# Patient Record
Sex: Male | Born: 1990 | Race: Black or African American | Hispanic: No | Marital: Single | Smoking: Current some day smoker
Health system: Southern US, Community
[De-identification: ages and names within clinical notes are randomized; demographics above are authoritative.]

## PROBLEM LIST (undated history)

## (undated) ENCOUNTER — Emergency Department (HOSPITAL_COMMUNITY): Admission: EM | Discharge status: Absent without leave | Payer: Medicaid Other

## (undated) ENCOUNTER — Emergency Department (HOSPITAL_COMMUNITY): Discharge status: Against Medical Advice | Payer: Self-pay

## (undated) DIAGNOSIS — E559 Vitamin D deficiency, unspecified: Secondary | ICD-10-CM

## (undated) DIAGNOSIS — K5000 Crohn's disease of small intestine without complications: Secondary | ICD-10-CM

## (undated) DIAGNOSIS — F71 Moderate intellectual disabilities: Secondary | ICD-10-CM

## (undated) DIAGNOSIS — Z7952 Long term (current) use of systemic steroids: Secondary | ICD-10-CM

## (undated) DIAGNOSIS — F909 Attention-deficit hyperactivity disorder, unspecified type: Secondary | ICD-10-CM

## (undated) DIAGNOSIS — K589 Irritable bowel syndrome without diarrhea: Secondary | ICD-10-CM

## (undated) HISTORY — PX: WRIST FRACTURE SURGERY: SHX121

## (undated) HISTORY — PX: COLONOSCOPY: SHX174

## (undated) HISTORY — DX: Long term (current) use of systemic steroids: Z79.52

## (undated) HISTORY — DX: Moderate intellectual disabilities: F71

---
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
## 2013-11-11 ENCOUNTER — Encounter (HOSPITAL_COMMUNITY): Payer: Self-pay | Admitting: Emergency Medicine

## 2013-11-11 ENCOUNTER — Emergency Department (HOSPITAL_COMMUNITY)
Admission: EM | Admit: 2013-11-11 | Discharge: 2013-11-11 | Disposition: A | Discharge status: Home / Self Care | Payer: Self-pay | Attending: Emergency Medicine | Admitting: Emergency Medicine

## 2013-11-11 DIAGNOSIS — R109 Unspecified abdominal pain: Secondary | ICD-10-CM

## 2013-11-11 DIAGNOSIS — R112 Nausea with vomiting, unspecified: Secondary | ICD-10-CM | POA: Insufficient documentation

## 2013-11-11 DIAGNOSIS — R1084 Generalized abdominal pain: Secondary | ICD-10-CM | POA: Insufficient documentation

## 2013-11-11 DIAGNOSIS — R1110 Vomiting, unspecified: Secondary | ICD-10-CM

## 2013-11-11 DIAGNOSIS — F17200 Nicotine dependence, unspecified, uncomplicated: Secondary | ICD-10-CM | POA: Insufficient documentation

## 2013-11-11 DIAGNOSIS — Z8659 Personal history of other mental and behavioral disorders: Secondary | ICD-10-CM | POA: Insufficient documentation

## 2013-11-11 LAB — COMPREHENSIVE METABOLIC PANEL
ALBUMIN: 3.8 g/dL (ref 3.5–5.2)
ALT: 8 U/L (ref 0–53)
AST: 16 U/L (ref 0–37)
Alkaline Phosphatase: 101 U/L (ref 39–117)
BUN: 11 mg/dL (ref 6–23)
CALCIUM: 9.4 mg/dL (ref 8.4–10.5)
CO2: 27 mEq/L (ref 19–32)
CREATININE: 0.94 mg/dL (ref 0.50–1.35)
Chloride: 99 mEq/L (ref 96–112)
GFR calc Af Amer: >90 mL/min (ref >90)
GFR calc non Af Amer: >90 mL/min (ref >90)
Glucose, Bld: 107 mg/dL — ABNORMAL HIGH (ref 70–99)
Potassium: 4.3 mEq/L (ref 3.7–5.3)
Sodium: 139 mEq/L (ref 137–147)
TOTAL PROTEIN: 7.6 g/dL (ref 6.0–8.3)
Total Bilirubin: 0.4 mg/dL (ref 0.3–1.2)

## 2013-11-11 LAB — CBC WITH DIFFERENTIAL/PLATELET
BASOS PCT: 0 % (ref 0–1)
Basophils Absolute: 0 10*3/uL (ref 0.0–0.1)
EOS ABS: 0 10*3/uL (ref 0.0–0.7)
Eosinophils Relative: 0 % (ref 0–5)
HEMATOCRIT: 40.9 % (ref 39.0–52.0)
HEMOGLOBIN: 14.4 g/dL (ref 13.0–17.0)
Lymphocytes Relative: 17 % (ref 12–46)
Lymphs Abs: 1.5 10*3/uL (ref 0.7–4.0)
MCH: 29.6 pg (ref 26.0–34.0)
MCHC: 35.2 g/dL (ref 30.0–36.0)
MCV: 84.2 fL (ref 78.0–100.0)
MONO ABS: 0.3 10*3/uL (ref 0.1–1.0)
MONOS PCT: 4 % (ref 3–12)
NEUTROS PCT: 79 % — AB (ref 43–77)
Neutro Abs: 6.8 10*3/uL (ref 1.7–7.7)
Platelets: 245 10*3/uL (ref 150–400)
RBC: 4.86 MIL/uL (ref 4.22–5.81)
RDW: 12.2 % (ref 11.5–15.5)
WBC: 8.6 10*3/uL (ref 4.0–10.5)

## 2013-11-11 LAB — URINALYSIS, ROUTINE W REFLEX MICROSCOPIC
Bilirubin Urine: NEGATIVE
Glucose, UA: NEGATIVE mg/dL
Hgb urine dipstick: NEGATIVE
KETONES UR: 15 mg/dL — AB
LEUKOCYTES UA: NEGATIVE
NITRITE: NEGATIVE
PH: 8.5 — AB (ref 5.0–8.0)
Protein, ur: NEGATIVE mg/dL
SPECIFIC GRAVITY, URINE: 1.011 (ref 1.005–1.030)
Urobilinogen, UA: 0.2 mg/dL (ref 0.0–1.0)

## 2013-11-11 LAB — LIPASE, BLOOD: LIPASE: 14 U/L (ref 11–59)

## 2013-11-11 MED ORDER — ONDANSETRON HCL 4 MG/2ML IJ SOLN
4.0000 mg | Freq: Once | INTRAMUSCULAR | Status: AC
  Administered 2013-11-11: 4 mg via INTRAVENOUS
  Filled 2013-11-11: qty 2

## 2013-11-11 MED ORDER — MORPHINE SULFATE 4 MG/ML IJ SOLN
4.0000 mg | Freq: Once | INTRAMUSCULAR | Status: AC
  Administered 2013-11-11: 4 mg via INTRAVENOUS
  Filled 2013-11-11: qty 1

## 2013-11-11 MED ORDER — PROMETHAZINE HCL 25 MG PO TABS: 25.0000 mg | ORAL_TABLET | Freq: Four times a day (QID) | ORAL | Status: DC | PRN

## 2013-11-11 MED ORDER — PROMETHAZINE HCL 25 MG/ML IJ SOLN
25.0000 mg | Freq: Once | INTRAMUSCULAR | Status: AC
  Administered 2013-11-11: 25 mg via INTRAVENOUS
  Filled 2013-11-11: qty 1

## 2013-11-11 MED ORDER — KETOROLAC TROMETHAMINE 30 MG/ML IJ SOLN
30.0000 mg | Freq: Once | INTRAMUSCULAR | Status: AC
  Administered 2013-11-11: 30 mg via INTRAVENOUS
  Filled 2013-11-11: qty 1

## 2013-11-11 MED ORDER — SODIUM CHLORIDE 0.9 % IV BOLUS (SEPSIS)
1000.0000 mL | Freq: Once | INTRAVENOUS | Status: AC
  Administered 2013-11-11: 1000 mL via INTRAVENOUS

## 2013-11-11 MED ORDER — LORAZEPAM 2 MG/ML IJ SOLN
1.0000 mg | Freq: Once | INTRAMUSCULAR | Status: AC
  Administered 2013-11-11: 1 mg via INTRAVENOUS
  Filled 2013-11-11: qty 1

## 2013-11-11 MED ORDER — DIAZEPAM 5 MG/ML IJ SOLN: 5.0000 mg | Freq: Once | INTRAMUSCULAR | Status: DC

## 2013-11-11 NOTE — ED Notes (Signed)
Bed: WA03 Expected date:  Expected time:  Means of arrival:  Comments: EMS/male with abdominal pain

## 2013-11-11 NOTE — ED Notes (Signed)
Attempted to get urine from pt, pt was vomiting and could not go.

## 2013-11-11 NOTE — ED Notes (Signed)
Patient with diffuse midline abdominal pain which started two days ago.  Patient started vomiting at 4AM and has vomited several times.

## 2013-11-11 NOTE — Discharge Instructions (Signed)
Take phenergan as needed for nausea.   Take motrin or tylenol for pain.   Stay hydrated.   Follow up with your doctor.   Return to ER if you have severe pain, dehydration, worse vomiting, fever.

## 2013-11-11 NOTE — ED Provider Notes (Signed)
CSN: [id number]     Arrival date & time 11/11/13  0725 History   First MD Initiated Contact with Patient 11/11/13 [phone]     Chief Complaint  Patient presents with  . Abdominal Pain     (Consider location/radiation/quality/duration/timing/severity/associated sxs/prior Treatment) The history is provided by the patient.  Philip Joseph is a 23 y.o. male history of ADHD here presenting with vomiting and abdominal pain. Diffuse lower abdominal pain for the last 2 days. Also vomiting and unable to keep anything down. He tried to drink some beer last night but vomited. He drinks alcohol occasionally but did not been treated recently. He's been having clear nonbilious and nonbloody vomit. Denies any diarrhea or urinary symptoms. Denies any fevers. Denies any sick contacts and no previous abdominal surgeries.    History reviewed. No pertinent past medical history. Past Surgical History  Procedure Laterality Date  . Wrist fracture surgery     History reviewed. No pertinent family history. History  Substance Use Topics  . Smoking status: Current Every Day Smoker  . Smokeless tobacco: Not on file  . Alcohol Use: Yes    Review of Systems  Gastrointestinal: Positive for nausea, vomiting and abdominal pain.  All other systems reviewed and are negative.      Allergies  Review of patient's allergies indicates no known allergies.  Home Medications   No current outpatient prescriptions on file. BP 106/52  Pulse 52  Temp(Src) 98.1 F (36.7 C) (Oral)  Resp 16  Wt 150 lb (68.04 kg)  SpO2 99% Physical Exam  Nursing note and vitals reviewed. Constitutional: He is oriented to person, place, and time. He appears well-developed.  Vomiting, uncomfortable   HENT:  Head: Normocephalic.  MM slightly dry   Eyes: Conjunctivae and EOM are normal. Pupils are equal, round, and reactive to light.  Neck: Normal range of motion. Neck supple.  Cardiovascular: Normal rate, regular rhythm and  normal heart sounds.   Pulmonary/Chest: Effort normal and breath sounds normal. No respiratory distress. He has no wheezes. He has no rales.  Abdominal: Soft. Bowel sounds are normal.  Mild diffuse lower abdominal tenderness, no rebound or guarding. No CVAT   Musculoskeletal: Normal range of motion. He exhibits no edema and no tenderness.  Neurological: He is alert and oriented to person, place, and time. No cranial nerve deficit. Coordination normal.  Skin: Skin is warm and dry.  Psychiatric: He has a normal mood and affect. His behavior is normal. Judgment and thought content normal.    ED Course  Procedures (including critical care time) Labs Review Labs Reviewed  CBC WITH DIFFERENTIAL - Abnormal; Notable for the following:    Neutrophils Relative % 79 (*)    All other components within normal limits  COMPREHENSIVE METABOLIC PANEL - Abnormal; Notable for the following:    Glucose, Bld 107 (*)    All other components within normal limits  URINALYSIS, ROUTINE W REFLEX MICROSCOPIC - Abnormal; Notable for the following:    APPearance HAZY (*)    pH 8.5 (*)    Ketones, ur 15 (*)    All other components within normal limits  LIPASE, BLOOD   Imaging Review No results found.   EKG Interpretation None      MDM   Final diagnoses:  None   Philip Joseph is a 23 y.o. male here with lower abdominal pain, vomiting. I think likely gastro and I doubt appy. Will get labs and give IVF and zofran and reassess.  11:45 AM Labs unremarkable. Small ketones in urine likely from dehydration. Tolerated PO, will d/c home with prn phenergan, motrin.    Wandra Arthurs, MD 11/11/13 1146

## 2013-11-11 NOTE — Progress Notes (Signed)
[facility] provided pt with a list of primary care resources to help patient establish primary care.  °

## 2013-11-12 ENCOUNTER — Emergency Department (HOSPITAL_COMMUNITY): Payer: Medicaid Other

## 2013-11-12 ENCOUNTER — Emergency Department (HOSPITAL_COMMUNITY)
Admission: EM | Admit: 2013-11-12 | Discharge: 2013-11-12 | Disposition: A | Discharge status: Home / Self Care | Payer: Medicaid Other | Attending: Emergency Medicine | Admitting: Emergency Medicine

## 2013-11-12 ENCOUNTER — Encounter (HOSPITAL_COMMUNITY): Payer: Self-pay | Admitting: Emergency Medicine

## 2013-11-12 DIAGNOSIS — R1032 Left lower quadrant pain: Secondary | ICD-10-CM | POA: Insufficient documentation

## 2013-11-12 DIAGNOSIS — Z8659 Personal history of other mental and behavioral disorders: Secondary | ICD-10-CM | POA: Insufficient documentation

## 2013-11-12 DIAGNOSIS — R109 Unspecified abdominal pain: Secondary | ICD-10-CM

## 2013-11-12 DIAGNOSIS — F17200 Nicotine dependence, unspecified, uncomplicated: Secondary | ICD-10-CM | POA: Diagnosis not present

## 2013-11-12 DIAGNOSIS — R112 Nausea with vomiting, unspecified: Secondary | ICD-10-CM

## 2013-11-12 LAB — CBC WITH DIFFERENTIAL/PLATELET
BASOS ABS: 0 10*3/uL (ref 0.0–0.1)
Basophils Relative: 0 % (ref 0–1)
EOS PCT: 0 % (ref 0–5)
Eosinophils Absolute: 0 10*3/uL (ref 0.0–0.7)
HEMATOCRIT: 40 % (ref 39.0–52.0)
Hemoglobin: 14.1 g/dL (ref 13.0–17.0)
LYMPHS ABS: 1.5 10*3/uL (ref 0.7–4.0)
LYMPHS PCT: 13 % (ref 12–46)
MCH: 29.7 pg (ref 26.0–34.0)
MCHC: 35.3 g/dL (ref 30.0–36.0)
MCV: 84.4 fL (ref 78.0–100.0)
MONO ABS: 0.3 10*3/uL (ref 0.1–1.0)
Monocytes Relative: 3 % (ref 3–12)
Neutro Abs: 9 10*3/uL — ABNORMAL HIGH (ref 1.7–7.7)
Neutrophils Relative %: 83 % — ABNORMAL HIGH (ref 43–77)
Platelets: 264 10*3/uL (ref 150–400)
RBC: 4.74 MIL/uL (ref 4.22–5.81)
RDW: 12.2 % (ref 11.5–15.5)
WBC: 10.8 10*3/uL — AB (ref 4.0–10.5)

## 2013-11-12 LAB — COMPREHENSIVE METABOLIC PANEL
ALBUMIN: 3.9 g/dL (ref 3.5–5.2)
ALT: 8 U/L (ref 0–53)
AST: 14 U/L (ref 0–37)
Alkaline Phosphatase: 95 U/L (ref 39–117)
BILIRUBIN TOTAL: 0.4 mg/dL (ref 0.3–1.2)
BUN: 13 mg/dL (ref 6–23)
CHLORIDE: 101 meq/L (ref 96–112)
CO2: 23 mEq/L (ref 19–32)
CREATININE: 1.01 mg/dL (ref 0.50–1.35)
Calcium: 9.5 mg/dL (ref 8.4–10.5)
GFR calc Af Amer: >90 mL/min (ref >90)
GFR calc non Af Amer: >90 mL/min (ref >90)
Glucose, Bld: 121 mg/dL — ABNORMAL HIGH (ref 70–99)
POTASSIUM: 3.6 meq/L — AB (ref 3.7–5.3)
SODIUM: 141 meq/L (ref 137–147)
Total Protein: 7.7 g/dL (ref 6.0–8.3)

## 2013-11-12 LAB — LIPASE, BLOOD: Lipase: 14 U/L (ref 11–59)

## 2013-11-12 MED ORDER — SODIUM CHLORIDE 0.9 % IV SOLN
1000.0000 mL | Freq: Once | INTRAVENOUS | Status: AC
  Administered 2013-11-12: 1000 mL via INTRAVENOUS

## 2013-11-12 MED ORDER — IOHEXOL 300 MG/ML  SOLN
50.0000 mL | Freq: Once | INTRAMUSCULAR | Status: AC | PRN
  Administered 2013-11-12: 50 mL via ORAL

## 2013-11-12 MED ORDER — HYDROCODONE-ACETAMINOPHEN 5-325 MG PO TABS: 1.0000 | ORAL_TABLET | Freq: Four times a day (QID) | ORAL | Status: DC | PRN

## 2013-11-12 MED ORDER — IOHEXOL 300 MG/ML  SOLN
100.0000 mL | Freq: Once | INTRAMUSCULAR | Status: AC | PRN
  Administered 2013-11-12: 100 mL via INTRAVENOUS

## 2013-11-12 MED ORDER — ONDANSETRON HCL 4 MG/2ML IJ SOLN
4.0000 mg | INTRAMUSCULAR | Status: AC
  Administered 2013-11-12: 4 mg via INTRAVENOUS
  Filled 2013-11-12: qty 2

## 2013-11-12 NOTE — ED Notes (Signed)
Per parents at bedside pt seen yesterday and given prescription for phenergan but they didn't get filled because they said they didn't know of any open pharmacy,  Pt given list of 24 hour pharmacy by this Probation officer.  Pt states abdominal pain and vomiting 4 times tonight

## 2013-11-12 NOTE — ED Notes (Signed)
Pt returned from CT scan.

## 2013-11-12 NOTE — ED Notes (Signed)
Pt reports emesis x 4 since being d/c yesterday @ 11am. Pt now report LLQ abd pain. Pt was unable to get Phenergan filled.

## 2013-11-12 NOTE — ED Provider Notes (Signed)
CSN: [id number]     Arrival date & time 11/12/13  5176 History   First MD Initiated Contact with Patient 11/12/13 0411     Chief Complaint  Patient presents with  . Emesis    (Consider location/radiation/quality/duration/timing/severity/associated sxs/prior Treatment) HPI Comments: Patient is a 23 y/o male with a hx of ADHD who presents to the ED for persistent abdominal pain and nausea/vomiting. Symptoms have been intermittent x 1 week, but worse and more frequent x 2 days. Patient evaluated for same yesterday. Patient states he felt improved upon ED discharge. He states he then went out to dinner and had BBQ wings. Patient awoke from sleep at 2am with worsening LLQ pain which was nonradiating and constant. Symptoms associated with 4 episodes of nonbloody emesis. Patient took 2 Excedrin for symptoms, but vomited them up. He denies fever, CP, SOB, hematemesis, melena, hematochezia, diarrhea, urinary symptoms, and a hx of abdominal surgeries.  Patient is a 23 y.o. male presenting with vomiting. The history is provided by the patient. No language interpreter was used.  Emesis Associated symptoms: abdominal pain     History reviewed. No pertinent past medical history. Past Surgical History  Procedure Laterality Date  . Wrist fracture surgery     No family history on file. History  Substance Use Topics  . Smoking status: Current Every Day Smoker  . Smokeless tobacco: Not on file  . Alcohol Use: Yes    Review of Systems  Gastrointestinal: Positive for nausea, vomiting and abdominal pain.  All other systems reviewed and are negative.      Allergies  Review of patient's allergies indicates no known allergies.  Home Medications   Current Outpatient Rx  Name  Route  Sig  Dispense  Refill  . HYDROcodone-acetaminophen (NORCO/VICODIN) 5-325 MG per tablet   Oral   Take 1-2 tablets by mouth every 6 (six) hours as needed.   13 tablet   0   . promethazine (PHENERGAN) 25 MG tablet    Oral   Take 1 tablet (25 mg total) by mouth every 6 (six) hours as needed for nausea or vomiting.   12 tablet   0    BP 123/68  Pulse 62  Temp(Src) 98.2 F (36.8 C) (Oral)  Resp 18  Ht 5' 8"  (1.727 m)  Wt 155 lb (70.308 kg)  BMI 23.57 kg/m2  SpO2 100%  Physical Exam  Nursing note and vitals reviewed. Constitutional: He is oriented to person, place, and time. He appears well-developed and well-nourished. No distress.  HENT:  Head: Normocephalic and atraumatic.  Mouth/Throat: Oropharynx is clear and moist. No oropharyngeal exudate.  Eyes: Conjunctivae and EOM are normal. Pupils are equal, round, and reactive to light. No scleral icterus.  Neck: Normal range of motion.  Cardiovascular: Normal rate, regular rhythm and normal heart sounds.   Pulmonary/Chest: Effort normal and breath sounds normal. No respiratory distress. He has no wheezes. He has no rales.  Abdominal: Soft. He exhibits no distension and no mass. There is tenderness (focal in LLQ). There is no rebound and no guarding.  No peritoneal signs.  Musculoskeletal: Normal range of motion.  Neurological: He is alert and oriented to person, place, and time.  Skin: Skin is warm and dry. No rash noted. He is not diaphoretic. No erythema. No pallor.  Psychiatric: He has a normal mood and affect. His behavior is normal.    ED Course  Procedures (including critical care time) Labs Review Labs Reviewed  COMPREHENSIVE METABOLIC PANEL - Abnormal; Notable  for the following:    Potassium 3.6 (*)    Glucose, Bld 121 (*)    All other components within normal limits  CBC WITH DIFFERENTIAL - Abnormal; Notable for the following:    WBC 10.8 (*)    Neutrophils Relative % 83 (*)    Neutro Abs 9.0 (*)    All other components within normal limits  LIPASE, BLOOD   Imaging Review Ct Abdomen Pelvis W Contrast  11/12/2013   CLINICAL DATA:  Lower abdominal pain with nausea vomiting.  EXAM: CT ABDOMEN AND PELVIS WITH CONTRAST  TECHNIQUE:  Multidetector CT imaging of the abdomen and pelvis was performed using the standard protocol following bolus administration of intravenous contrast.  CONTRAST:  95m OMNIPAQUE IOHEXOL 300 MG/ML SOLN, 1069mOMNIPAQUE IOHEXOL 300 MG/ML SOLN  COMPARISON:  Prior radiograph from 05/19/2006.  . Marland KitchenFINDINGS: The visualized lung bases are clear.  The liver demonstrates a normal contrast enhanced appearance. The gallbladder is within normal limits. The spleen, adrenal glands, and pancreas demonstrate a normal contrast enhanced appearance.  The kidneys are equal size with symmetric enhancement. No nephrolithiasis, hydronephrosis, or focal enhancing renal mass.  There is no evidence of bowel obstruction. Stomach is within normal limits. There is mucosal enhancement with wall thickening and edema within the terminal ileum in the right lower quadrant (series 2, image 71). This may be either infectious or inflammatory in nature. The appendix is not definitely visualized. Moderate amount of retained stool present diffusely throughout the colon. No inflammatory fat stranding, wall thickening, or abnormal mucosal enhancement seen within the colon itself.  Bladder is within normal limits. Prostate is unremarkable. Small moderate free fluid present within the pelvis. This may be reactive from the inflammatory process within the adjacent distal small bowel. No free intraperitoneal air. No pathologically enlarged intra-abdominal pelvic lymph nodes.  No acute osseous abnormality. No worrisome lytic or blastic osseous lesions.  IMPRESSION: 1. Abnormal mucosal enhancement, wall thickening, and inflammatory fat stranding about the terminal ileum in the lower right abdomen/pelvis. Finding is compatible with acute terminal ileitis. This may be either infectious or inflammatory in nature, with possible Crohn's disease a consideration. 2. Small volume free fluid within the pelvis, likely reactive due to adjacent inflammatory process within the  ileum.   Electronically Signed   By: BeJeannine Boga.D.   On: 11/12/2013 05:59     EKG Interpretation None      MDM   Final diagnoses:  Abdominal pain  Nausea and vomiting    [age]ear old male presents to the emergency department for persistent abdominal pain, nausea, and vomiting. Patient was evaluated yesterday for same and discharged without imaging. Patient states that he felt better after discharge, went out and ate BBQ wings for dinner, and later developed worsening abdominal pain with emesis x 4. Patient claims that abdominal discomfort and emesis has been intermittent over the past week.  Labs today stable with Priors within the last 24 hours. Patient does have a mildly increased white count compared to his prior visit. Symptoms greatly improved with IV fluids and Zofran. Patient has not had any additional emesis since arrival in the ED. On my examination he exhibits moderate focal tenderness to deep palpation of the left lower quadrant. No signs of peritonitis appreciated. CT scan ordered to evaluate for diverticulitis and obstruction. CT scan today remarkable only for wall thickening and inflammatory changes to the terminal ileum in the right lower pelvis. DDx includes infectious vs inflammatory etiology; unable to exclude Crohn's disease.  Have discussed findings with Dr. Roxanne Mins. My suspicion is low her for an infectious origin of symptoms given their intermittent nature over the past week in addition to patient's lack of significant leukocytosis or fever. Patient also without any other signs or symptoms to suggest acute infection. Plan for patient includes outpatient management with phenergan, Norco, and diet for IBS to see if it provides patient with any relief. Have also referred patient to GI for further work up. Do not believe abx are warranted for management at this time. Plan discussed with Dr. Roxanne Mins who believes management of this patient to be reasonable as well.    Patient stable and appropriate for discharge today. Return precautions provided and discussed with both the parents and patient who verbalized understanding. Patient with no unaddressed concerns.    Antonietta Breach, PA-C 11/12/13 1952

## 2013-11-12 NOTE — Discharge Instructions (Signed)
Abdominal Pain, Adult Many things can cause abdominal pain. Usually, abdominal pain is not caused by a disease and will improve without treatment. It can often be observed and treated at home. Your health care provider will do a physical exam and possibly order blood tests and X-rays to help determine the seriousness of your pain. However, in many cases, more time must pass before a clear cause of the pain can be found. Before that point, your health care provider may not know if you need more testing or further treatment. HOME CARE INSTRUCTIONS  Monitor your abdominal pain for any changes. The following actions may help to alleviate any discomfort you are experiencing:  Only take over-the-counter or prescription medicines as directed by your health care provider.  Do not take laxatives unless directed to do so by your health care provider.  Try a clear liquid diet (broth, tea, or water) as directed by your health care provider. Slowly move to a bland diet as tolerated. SEEK MEDICAL CARE IF:  You have unexplained abdominal pain.  You have abdominal pain associated with nausea or diarrhea.  You have pain when you urinate or have a bowel movement.  You experience abdominal pain that wakes you in the night.  You have abdominal pain that is worsened or improved by eating food.  You have abdominal pain that is worsened with eating fatty foods. SEEK IMMEDIATE MEDICAL CARE IF:   Your pain does not go away within 2 hours.  You have a fever.  You keep throwing up (vomiting).  Your pain is felt only in portions of the abdomen, such as the right side or the left lower portion of the abdomen.  You pass bloody or black tarry stools. MAKE SURE YOU:  Understand these instructions.   Will watch your condition.   Will get help right away if you are not doing well or get worse.  Document Released: 05/21/2005 Document Revised: 06/01/2013 Document Reviewed: 04/20/2013 [facility] Patient  Information 2014 [place]. Diet and Irritable Bowel Syndrome  No cure has been found for irritable bowel syndrome (IBS). Many options are available to treat the symptoms. Your caregiver will give you the best treatments available for your symptoms. He or she will also encourage you to manage stress and to make changes to your diet. You need to work with your caregiver and Registered Dietician to find the best combination of medicine, diet, counseling, and support to control your symptoms. The following are some diet suggestions. FOODS THAT MAKE IBS WORSE  Fatty foods, such as [place] fries.  Milk products, such as cheese or ice cream.  Chocolate.  Alcohol.  Caffeine (found in coffee and some sodas).  Carbonated drinks, such as soda. If certain foods cause symptoms, you should eat less of them or stop eating them. FOOD JOURNAL   Keep a journal of the foods that seem to cause distress. Write down:  What you are eating during the day and when.  What problems you are having after eating.  When the symptoms occur in relation to your meals.  What foods always make you feel badly.  Take your notes with you to your caregiver to see if you should stop eating certain foods. FOODS THAT MAKE IBS BETTER Fiber reduces IBS symptoms, especially constipation, because it makes stools soft, bulky, and easier to pass. Fiber is found in bran, bread, cereal, beans, fruit, and vegetables. Examples of foods with fiber include:  Apples.  Peaches.  Pears.  Berries.  Figs.  Broccoli, raw.  Cabbage.  Carrots.  Raw peas.  Kidney beans.  Lima beans.  Whole-grain bread.  Whole-grain cereal. Add foods with fiber to your diet a little at a time. This will let your body get used to them. Too much fiber at once might cause gas and swelling of your abdomen. This can trigger symptoms in a person with IBS. Caregivers usually recommend a diet with enough fiber to produce soft, painless bowel  movements. High fiber diets may cause gas and bloating. However, these symptoms often go away within a few weeks, as your body adjusts. In many cases, dietary fiber may lessen IBS symptoms, particularly constipation. However, it may not help pain or diarrhea. High fiber diets keep the colon mildly enlarged (distended) with the added fiber. This may help prevent spasms in the colon. Some forms of fiber also keep water in the stool, thereby preventing hard stools that are difficult to pass.  Besides telling you to eat more foods with fiber, your caregiver may also tell you to get more fiber by taking a fiber pill or drinking water mixed with a special high fiber powder. An example of this is a natural fiber laxative containing psyllium seed.  TIPS  Large meals can cause cramping and diarrhea in people with IBS. If this happens to you, try eating 4 or 5 small meals a day, or try eating less at each of your usual 3 meals. It may also help if your meals are low in fat and high in carbohydrates. Examples of carbohydrates are pasta, rice, whole-grain breads and cereals, fruits, and vegetables.  If dairy products cause your symptoms to flare up, you can try eating less of those foods. You might be able to handle yogurt better than other dairy products, because it contains bacteria that helps with digestion. Dairy products are an important source of calcium and other nutrients. If you need to avoid dairy products, be sure to talk with a Registered Dietitian about getting these nutrients through other food sources.  Drink enough water and fluids to keep your urine clear or pale yellow. This is important, especially if you have diarrhea. FOR MORE INFORMATION  International Foundation for Functional Gastrointestinal Disorders: www.iffgd.org  National Digestive Diseases Information Clearinghouse: digestive.AmenCredit.is Document Released: 11/01/2003 Document Revised: 11/03/2011 Document Reviewed:  07/19/2007 [facility] Patient Information 2014 [place], [place].

## 2013-11-13 NOTE — ED Provider Notes (Signed)
Medical screening examination/treatment/procedure(s) were performed by non-physician practitioner and as supervising physician I was immediately available for consultation/collaboration.   Delora Fuel, MD 52/[id number]

## 2013-11-14 ENCOUNTER — Encounter (HOSPITAL_COMMUNITY): Payer: Self-pay | Admitting: Emergency Medicine

## 2013-11-14 ENCOUNTER — Emergency Department (HOSPITAL_COMMUNITY)
Admission: EM | Admit: 2013-11-14 | Discharge: 2013-11-14 | Disposition: A | Discharge status: Home / Self Care | Payer: Self-pay | Attending: Emergency Medicine | Admitting: Emergency Medicine

## 2013-11-14 DIAGNOSIS — K5000 Crohn's disease of small intestine without complications: Secondary | ICD-10-CM | POA: Insufficient documentation

## 2013-11-14 DIAGNOSIS — F17200 Nicotine dependence, unspecified, uncomplicated: Secondary | ICD-10-CM | POA: Insufficient documentation

## 2013-11-14 HISTORY — DX: Irritable bowel syndrome without diarrhea: K58.9

## 2013-11-14 MED ORDER — ONDANSETRON 8 MG PO TBDP
8.0000 mg | ORAL_TABLET | Freq: Once | ORAL | Status: AC
  Administered 2013-11-14: 8 mg via ORAL
  Filled 2013-11-14: qty 1

## 2013-11-14 MED ORDER — METRONIDAZOLE 500 MG PO TABS: 500.0000 mg | ORAL_TABLET | Freq: Three times a day (TID) | ORAL | Status: DC

## 2013-11-14 MED ORDER — CIPROFLOXACIN HCL 500 MG PO TABS: 500.0000 mg | ORAL_TABLET | Freq: Two times a day (BID) | ORAL | Status: DC

## 2013-11-14 MED ORDER — ONDANSETRON HCL 8 MG PO TABS: 8.0000 mg | ORAL_TABLET | Freq: Three times a day (TID) | ORAL | Status: DC | PRN

## 2013-11-14 MED ORDER — DICYCLOMINE HCL 20 MG PO TABS: 20.0000 mg | ORAL_TABLET | Freq: Two times a day (BID) | ORAL | Status: DC

## 2013-11-14 MED ORDER — DICYCLOMINE HCL 10 MG PO CAPS
10.0000 mg | ORAL_CAPSULE | Freq: Once | ORAL | Status: AC
  Administered 2013-11-14: 10 mg via ORAL
  Filled 2013-11-14: qty 1

## 2013-11-14 MED ORDER — ONDANSETRON HCL 4 MG/2ML IJ SOLN: 4.0000 mg | Freq: Once | INTRAMUSCULAR | Status: DC

## 2013-11-14 NOTE — ED Provider Notes (Signed)
Medical screening examination/treatment/procedure(s) were performed by non-physician practitioner and as supervising physician I was immediately available for consultation/collaboration.   EKG Interpretation None       Philip Joseph. Alvino Chapel, MD 11/14/13 [phone]

## 2013-11-14 NOTE — Progress Notes (Signed)
[facility] provided pt with a list of primary care resources and a [facility] Card application to help patient establish primary care.  °

## 2013-11-14 NOTE — ED Notes (Signed)
Pt here for symptoms of his IBS, abd cramping, n/v/d. Pt's father states that he feels the pt is becoming abusive to the pain meds by sedating himself with them and talso states the patient isnt eating the foods for the IBS diet while they are gone to work and pt is home alone. Pt's father is requesting less sedative pain meds for the pt.

## 2013-11-14 NOTE — ED Provider Notes (Signed)
CSN: [id number]     Arrival date & time 11/14/13  0712 History   First MD Initiated Contact with Patient 11/14/13 [phone]     Chief Complaint  Patient presents with  . Abdominal Pain  . Nausea  . Emesis  . Diarrhea     (Consider location/radiation/quality/duration/timing/severity/associated sxs/prior Treatment) HPI Gunter L Reever is a 23 y.o. male who presents to ED with complaint of nausea, vomiting, abdominal pain. Reports symptoms for a week. Has been seen here twice for the same. Last visit had CT abd/pelvis done which showed terminal ilitis. Was referred to GI, and was started on IBS diet. Given norco and phenergan for his symptoms. Pt states as long as he is taking his medications, his symptoms are manageable. States this morning ran out and had 3 episodes of emesis and pain. States pain is in the left lower quadrant, tender to palpation. Denies any diarrhea. Last BM was yesterday and is normal. Father is concerned about pt being on sedative medications, and states he is not watching his diet as was instructed to. States she is eating burritos and french fries at home. Father also concerned about pt being addicted to the norco, pt apparently requesting more pills "to go to sleep." Pt has not yet followed up with GI.   Past Medical History  Diagnosis Date  . IBS (irritable bowel syndrome)    Past Surgical History  Procedure Laterality Date  . Wrist fracture surgery     No family history on file. History  Substance Use Topics  . Smoking status: Current Every Day Smoker  . Smokeless tobacco: Not on file  . Alcohol Use: Yes    Review of Systems  Constitutional: Negative for fever and chills.  Respiratory: Negative for cough, chest tightness and shortness of breath.   Cardiovascular: Negative for chest pain, palpitations and leg swelling.  Gastrointestinal: Positive for nausea, vomiting and abdominal pain. Negative for diarrhea and abdominal distention.  Genitourinary:  Negative for dysuria, urgency, frequency and hematuria.  Musculoskeletal: Negative for arthralgias, myalgias, neck pain and neck stiffness.  Skin: Negative for rash.  Allergic/Immunologic: Negative for immunocompromised state.  Neurological: Negative for dizziness, weakness, light-headedness, numbness and headaches.      Allergies  Review of patient's allergies indicates no known allergies.  Home Medications   Current Outpatient Rx  Name  Route  Sig  Dispense  Refill  . HYDROcodone-acetaminophen (NORCO/VICODIN) 5-325 MG per tablet   Oral   Take 1-2 tablets by mouth every 6 (six) hours as needed.   13 tablet   0   . promethazine (PHENERGAN) 25 MG tablet   Oral   Take 1 tablet (25 mg total) by mouth every 6 (six) hours as needed for nausea or vomiting.   12 tablet   0    BP 138/79  Pulse 51  Temp(Src) 98.5 F (36.9 C) (Oral)  Resp 18  SpO2 100% Physical Exam  Nursing note and vitals reviewed. Constitutional: He appears well-developed and well-nourished. No distress.  HENT:  Head: Normocephalic and atraumatic.  Eyes: Conjunctivae are normal.  Neck: Neck supple.  Cardiovascular: Normal rate, regular rhythm and normal heart sounds.   Pulmonary/Chest: Effort normal. No respiratory distress. He has no wheezes. He has no rales.  Abdominal: Soft. Bowel sounds are normal. He exhibits no distension. There is tenderness. There is no rebound and no guarding.  LLQ tenderness  Musculoskeletal: He exhibits no edema.  Neurological: He is alert.  Skin: Skin is warm and dry.  ED Course  Procedures (including critical care time) Labs Review Labs Reviewed - No data to display Imaging Review No results found.   EKG Interpretation None      MDM   Final diagnoses:  Ileitis, terminal   Patient in emergency department with persistent abdominal pain, nausea, vomiting. No diarrhea. No fever. He has been seen for this twice in the last 3 days. CT scan was obtained in the last  visit showed ileitis. Patient has a referral to gastroenterologist but because was a week and he has not seen him yet. He is here because he ran out of his pain medications and anti-emetics. Father requested patient does not get anyone her chronic medicines or Phenergan, states that he takes it to "get high and sleep." I did order her some Zofran emergency department detective vomiting, his nausea is resolved. He was able to keep down water and Bentyl. I will discharge him home on Zofran and Bentyl, will also add Flagyl and Cipro for possible infectious cause given his symptoms or not improving. I will follow him up with a gastroenterologist, father made a call while in emergency department, and he was told he will call back with an appointment. At this time patient is in no acute distress, vital signs are normal, I do not think repeat blood work or imaging is necessary or indicated at this time. His symptoms are persistent, but not worsening.  we will discharge home with close follow up.  Filed Vitals:   11/14/13 0720  BP: 138/79  Pulse: 51  Temp: 98.5 F (36.9 C)  TempSrc: Oral  Resp: 18  SpO2: 100%          Florene Route Brylyn Novakovich, PA-C 11/14/13 [phone]

## 2013-11-14 NOTE — Discharge Instructions (Signed)
Take cirpo and flagyl for possible infectious cause of your pain. Take zofran for nausea. Bentyl for pain. Make sure to stick with your diet. It is very important to follow up with a gastroenterologist for further evaluation and treatment.    Colitis Colitis is inflammation of the colon. Colitis can be a short-term or long-standing (chronic) illness. Crohn's disease and ulcerative colitis are 2 types of colitis which are chronic. They usually require lifelong treatment. CAUSES  There are many different causes of colitis, including:  Viruses.  Germs (bacteria).  Medicine reactions. SYMPTOMS   Diarrhea.  Intestinal bleeding.  Pain.  Fever.  Throwing up (vomiting).  Tiredness (fatigue).  Weight loss.  Bowel blockage. DIAGNOSIS  The diagnosis of colitis is based on examination and stool or blood tests. X-rays, CT scan, and colonoscopy may also be needed. TREATMENT  Treatment may include:  Fluids given through the vein (intravenously).  Bowel rest (nothing to eat or drink for a period of time).  Medicine for pain and diarrhea.  Medicines (antibiotics) that kill germs.  Cortisone medicines.  Surgery. HOME CARE INSTRUCTIONS   Get plenty of rest.  Drink enough water and fluids to keep your urine clear or pale yellow.  Eat a well-balanced diet.  Call your caregiver for follow-up as recommended. SEEK IMMEDIATE MEDICAL CARE IF:   You develop chills.  You have an oral temperature above 102 F (38.9 C), not controlled by medicine.  You have extreme weakness, fainting, or dehydration.  You have repeated vomiting.  You develop severe belly (abdominal) pain or are passing bloody or tarry stools. MAKE SURE YOU:   Understand these instructions.  Will watch your condition.  Will get help right away if you are not doing well or get worse. Document Released: 09/18/2004 Document Revised: 11/03/2011 Document Reviewed: 12/14/2009 [facility] Patient Information 2014  [place], [place].

## 2013-12-18 ENCOUNTER — Emergency Department (HOSPITAL_COMMUNITY): Payer: Medicaid Other

## 2013-12-18 ENCOUNTER — Encounter (HOSPITAL_COMMUNITY): Payer: Self-pay | Admitting: Emergency Medicine

## 2013-12-18 ENCOUNTER — Inpatient Hospital Stay (HOSPITAL_COMMUNITY)
Admission: EM | Admit: 2013-12-18 | Discharge: 2013-12-22 | DRG: 386 | Disposition: A | Discharge status: Home / Self Care | Payer: Medicaid Other | Attending: Internal Medicine | Admitting: Internal Medicine

## 2013-12-18 DIAGNOSIS — R188 Other ascites: Secondary | ICD-10-CM | POA: Diagnosis present

## 2013-12-18 DIAGNOSIS — K589 Irritable bowel syndrome without diarrhea: Secondary | ICD-10-CM | POA: Diagnosis present

## 2013-12-18 DIAGNOSIS — E559 Vitamin D deficiency, unspecified: Secondary | ICD-10-CM | POA: Diagnosis present

## 2013-12-18 DIAGNOSIS — R609 Edema, unspecified: Secondary | ICD-10-CM | POA: Diagnosis present

## 2013-12-18 DIAGNOSIS — F1910 Other psychoactive substance abuse, uncomplicated: Secondary | ICD-10-CM | POA: Diagnosis present

## 2013-12-18 DIAGNOSIS — R109 Unspecified abdominal pain: Secondary | ICD-10-CM | POA: Diagnosis present

## 2013-12-18 DIAGNOSIS — F17200 Nicotine dependence, unspecified, uncomplicated: Secondary | ICD-10-CM | POA: Diagnosis present

## 2013-12-18 DIAGNOSIS — Z79899 Other long term (current) drug therapy: Secondary | ICD-10-CM

## 2013-12-18 DIAGNOSIS — F1010 Alcohol abuse, uncomplicated: Secondary | ICD-10-CM | POA: Diagnosis present

## 2013-12-18 DIAGNOSIS — F909 Attention-deficit hyperactivity disorder, unspecified type: Secondary | ICD-10-CM | POA: Diagnosis present

## 2013-12-18 DIAGNOSIS — R112 Nausea with vomiting, unspecified: Secondary | ICD-10-CM | POA: Diagnosis present

## 2013-12-18 DIAGNOSIS — Z23 Encounter for immunization: Secondary | ICD-10-CM | POA: Diagnosis not present

## 2013-12-18 DIAGNOSIS — K56699 Other intestinal obstruction unspecified as to partial versus complete obstruction: Secondary | ICD-10-CM | POA: Diagnosis present

## 2013-12-18 DIAGNOSIS — K5000 Crohn's disease of small intestine without complications: Secondary | ICD-10-CM | POA: Diagnosis present

## 2013-12-18 DIAGNOSIS — E860 Dehydration: Secondary | ICD-10-CM | POA: Diagnosis present

## 2013-12-18 HISTORY — DX: Attention-deficit hyperactivity disorder, unspecified type: F90.9

## 2013-12-18 HISTORY — DX: Vitamin D deficiency, unspecified: E55.9

## 2013-12-18 HISTORY — DX: Crohn's disease of small intestine without complications: K50.00

## 2013-12-18 LAB — CBC WITH DIFFERENTIAL/PLATELET
Basophils Absolute: 0 10*3/uL (ref 0.0–0.1)
Basophils Relative: 0 % (ref 0–1)
EOS PCT: 0 % (ref 0–5)
Eosinophils Absolute: 0 10*3/uL (ref 0.0–0.7)
HEMATOCRIT: 45 % (ref 39.0–52.0)
Hemoglobin: 15.9 g/dL (ref 13.0–17.0)
LYMPHS ABS: 2 10*3/uL (ref 0.7–4.0)
Lymphocytes Relative: 23 % (ref 12–46)
MCH: 29.7 pg (ref 26.0–34.0)
MCHC: 35.3 g/dL (ref 30.0–36.0)
MCV: 84.1 fL (ref 78.0–100.0)
Monocytes Absolute: 0.5 10*3/uL (ref 0.1–1.0)
Monocytes Relative: 6 % (ref 3–12)
Neutro Abs: 6.4 10*3/uL (ref 1.7–7.7)
Neutrophils Relative %: 71 % (ref 43–77)
Platelets: 245 10*3/uL (ref 150–400)
RBC: 5.35 MIL/uL (ref 4.22–5.81)
RDW: 12.7 % (ref 11.5–15.5)
WBC: 9.1 10*3/uL (ref 4.0–10.5)

## 2013-12-18 LAB — URINALYSIS, ROUTINE W REFLEX MICROSCOPIC
Glucose, UA: NEGATIVE mg/dL
Hgb urine dipstick: NEGATIVE
Ketones, ur: 15 mg/dL — AB
Leukocytes, UA: NEGATIVE
NITRITE: NEGATIVE
Protein, ur: NEGATIVE mg/dL
SPECIFIC GRAVITY, URINE: 1.04 — AB (ref 1.005–1.030)
Urobilinogen, UA: 0.2 mg/dL (ref 0.0–1.0)
pH: 5.5 (ref 5.0–8.0)

## 2013-12-18 LAB — COMPREHENSIVE METABOLIC PANEL
ALT: 12 U/L (ref 0–53)
AST: 17 U/L (ref 0–37)
Albumin: 4.4 g/dL (ref 3.5–5.2)
Alkaline Phosphatase: 98 U/L (ref 39–117)
BUN: 17 mg/dL (ref 6–23)
CALCIUM: 9.9 mg/dL (ref 8.4–10.5)
CO2: 27 meq/L (ref 19–32)
CREATININE: 1.05 mg/dL (ref 0.50–1.35)
Chloride: 94 mEq/L — ABNORMAL LOW (ref 96–112)
GFR calc non Af Amer: >90 mL/min (ref >90)
Glucose, Bld: 91 mg/dL (ref 70–99)
Potassium: 4 mEq/L (ref 3.7–5.3)
Sodium: 137 mEq/L (ref 137–147)
Total Bilirubin: 0.6 mg/dL (ref 0.3–1.2)
Total Protein: 8.7 g/dL — ABNORMAL HIGH (ref 6.0–8.3)

## 2013-12-18 LAB — RAPID URINE DRUG SCREEN, HOSP PERFORMED
Amphetamines: NOT DETECTED
Barbiturates: NOT DETECTED
Benzodiazepines: NOT DETECTED
COCAINE: NOT DETECTED
Opiates: NOT DETECTED
Tetrahydrocannabinol: POSITIVE — AB

## 2013-12-18 MED ORDER — ENOXAPARIN SODIUM 40 MG/0.4ML ~~LOC~~ SOLN
40.0000 mg | SUBCUTANEOUS | Status: DC
  Administered 2013-12-18 – 2013-12-21 (×4): 40 mg via SUBCUTANEOUS
  Filled 2013-12-18 (×5): qty 0.4

## 2013-12-18 MED ORDER — FENTANYL CITRATE 0.05 MG/ML IJ SOLN
100.0000 ug | Freq: Once | INTRAMUSCULAR | Status: AC
  Administered 2013-12-18: 100 ug via INTRAVENOUS
  Filled 2013-12-18: qty 2

## 2013-12-18 MED ORDER — IOHEXOL 300 MG/ML  SOLN
100.0000 mL | Freq: Once | INTRAMUSCULAR | Status: AC | PRN
  Administered 2013-12-18: 100 mL via INTRAVENOUS

## 2013-12-18 MED ORDER — DICYCLOMINE HCL 20 MG PO TABS
20.0000 mg | ORAL_TABLET | Freq: Two times a day (BID) | ORAL | Status: DC
  Administered 2013-12-18 – 2013-12-22 (×7): 20 mg via ORAL
  Filled 2013-12-18 (×9): qty 1

## 2013-12-18 MED ORDER — METRONIDAZOLE 500 MG PO TABS
500.0000 mg | ORAL_TABLET | Freq: Three times a day (TID) | ORAL | Status: DC
  Administered 2013-12-18 – 2013-12-21 (×8): 500 mg via ORAL
  Filled 2013-12-18 (×10): qty 1

## 2013-12-18 MED ORDER — SODIUM CHLORIDE 0.9 % IV BOLUS (SEPSIS)
1000.0000 mL | Freq: Once | INTRAVENOUS | Status: AC
Start: 2013-12-18 — End: 2013-12-18
  Administered 2013-12-18: 1000 mL via INTRAVENOUS

## 2013-12-18 MED ORDER — HYDROMORPHONE HCL PF 1 MG/ML IJ SOLN: 0.5000 mg | INTRAMUSCULAR | Status: DC | PRN

## 2013-12-18 MED ORDER — VITAMIN B-1 100 MG PO TABS
100.0000 mg | ORAL_TABLET | Freq: Every day | ORAL | Status: DC
  Administered 2013-12-18 – 2013-12-22 (×4): 100 mg via ORAL
  Filled 2013-12-18 (×5): qty 1

## 2013-12-18 MED ORDER — IOHEXOL 300 MG/ML  SOLN
50.0000 mL | Freq: Once | INTRAMUSCULAR | Status: AC | PRN
  Administered 2013-12-18: 50 mL via ORAL

## 2013-12-18 MED ORDER — SODIUM CHLORIDE 0.9 % IV SOLN
INTRAVENOUS | Status: DC
  Administered 2013-12-18 – 2013-12-21 (×5): via INTRAVENOUS

## 2013-12-18 MED ORDER — CIPROFLOXACIN HCL 250 MG PO TABS
250.0000 mg | ORAL_TABLET | Freq: Two times a day (BID) | ORAL | Status: DC
  Administered 2013-12-18 – 2013-12-21 (×5): 250 mg via ORAL
  Filled 2013-12-18 (×8): qty 1

## 2013-12-18 MED ORDER — ONDANSETRON HCL 4 MG/2ML IJ SOLN
4.0000 mg | Freq: Once | INTRAMUSCULAR | Status: AC
  Administered 2013-12-18: 4 mg via INTRAVENOUS
  Filled 2013-12-18: qty 2

## 2013-12-18 MED ORDER — PNEUMOCOCCAL VAC POLYVALENT 25 MCG/0.5ML IJ INJ
0.5000 mL | INJECTION | INTRAMUSCULAR | Status: AC
  Administered 2013-12-19: 0.5 mL via INTRAMUSCULAR
  Filled 2013-12-18 (×2): qty 0.5

## 2013-12-18 MED ORDER — ONDANSETRON HCL 4 MG/2ML IJ SOLN
4.0000 mg | Freq: Four times a day (QID) | INTRAMUSCULAR | Status: DC | PRN
  Administered 2013-12-18 – 2013-12-20 (×3): 4 mg via INTRAVENOUS
  Filled 2013-12-18 (×3): qty 2

## 2013-12-18 NOTE — ED Notes (Signed)
Somnolent, easily aroused.  Resting much more easily. Side rails up x 2, grandfather at bedside.

## 2013-12-18 NOTE — H&P (Signed)
[facility] History and Physical  Philip Joseph NWG:[id number] DOB: Sep 27, 1990 DOA: 12/18/2013  Referring physician: Micheline Maze PCP: No primary provider on file.   Chief Complaint:Abd pain  HPI: Philip Joseph is a 23 y.o. male with PMh of ADHD, has been seen in the ER 4  Times within the last month with Abdominal pain. He was diagnosed with Terminal ileitis based on CT in 3/21 and was discharge home on pain medications and antibiotics, since then was seen again and sent home on Cipro/Flagyl and Zofran, family member mentioned to EDP some concern about controlled substances then. He now stays with his grandfather who brings him to the ER today with recurrence of Nausea, vomiting and diffuse intermittent, mostly lower quadrant abd pain since Thursday. No fevers or chills, no hemetemesis, melena or hematochezia He was supposed to FU with GI after ER visit but was unable to to pay the upfront cost and hence did not follow up. In ER, CT with persistent terminal ileitis  Review of Systems:  Constitutional:  No weight loss, night sweats, Fevers, chills, fatigue.  HEENT:  No headaches, Difficulty swallowing,Tooth/dental problems,Sore throat,  No sneezing, itching, ear ache, nasal congestion, post nasal drip,  Cardio-vascular:  No chest pain, Orthopnea, PND, swelling in lower extremities, anasarca, dizziness, palpitations  GI:  No heartburn, indigestion, abdominal pain, nausea, vomiting, diarrhea, change in bowel habits, loss of appetite  Resp:  No shortness of breath with exertion or at rest. No excess mucus, no productive cough, No non-productive cough, No coughing up of blood.No change in color of mucus.No wheezing.No chest wall deformity  Skin:  no rash or lesions.  GU:  no dysuria, change in color of urine, no urgency or frequency. No flank pain.  Musculoskeletal:  No joint pain or swelling. No decreased range of motion. No back pain.  Psych:  No change in mood or  affect. No depression or anxiety. No memory loss.   Past Medical History  Diagnosis Date  . IBS (irritable bowel syndrome)    Past Surgical History  Procedure Laterality Date  . Wrist fracture surgery     Social History:  reports that he has been smoking.  He does not have any smokeless tobacco history on file. He reports that he drinks alcohol. He reports that he does not use illicit drugs.  No Known Allergies  No family history on file.   Prior to Admission medications   Medication Sig Start Date End Date Taking? Authorizing Provider  ciprofloxacin (CIPRO) 500 MG tablet Take 1 tablet (500 mg total) by mouth every 12 (twelve) hours. 11/14/13   Tatyana A Kirichenko, PA-C  dicyclomine (BENTYL) 20 MG tablet Take 1 tablet (20 mg total) by mouth 2 (two) times daily. 11/14/13   Tatyana A Kirichenko, PA-C  HYDROcodone-acetaminophen (NORCO/VICODIN) 5-325 MG per tablet Take 1-2 tablets by mouth every 6 (six) hours as needed. 11/12/13   Antonietta Breach, PA-C  metroNIDAZOLE (FLAGYL) 500 MG tablet Take 1 tablet (500 mg total) by mouth 3 (three) times daily. 11/14/13   Tatyana A Kirichenko, PA-C  ondansetron (ZOFRAN) 8 MG tablet Take 1 tablet (8 mg total) by mouth every 8 (eight) hours as needed for nausea or vomiting. 11/14/13   Tatyana A Kirichenko, PA-C  promethazine (PHENERGAN) 25 MG tablet Take 1 tablet (25 mg total) by mouth every 6 (six) hours as needed for nausea or vomiting. 11/11/13   Wandra Arthurs, MD   Physical Exam: Filed Vitals:   12/18/13 1539  BP: 120/57  Pulse: 53  Temp:   Resp: 19    BP 120/57  Pulse 53  Temp(Src) 97.8 F (36.6 C) (Oral)  Resp 19  SpO2 98%  General:  Appears calm and comfortable, no distress Eyes: PERRL, normal lids, irises & conjunctiva ENT: grossly normal hearing, lips & tongue Neck: no LAD, masses or thyromegaly Cardiovascular: RRR, no m/r/g. No LE edema. Telemetry: SR, no arrhythmias  Respiratory: CTA bilaterally, no w/r/r. Normal respiratory  effort. Abdomen: soft, NT, ND, BS present Skin: no rash or induration seen on limited exam Musculoskeletal: grossly normal tone BUE/BLE Psychiatric: grossly normal mood and affect, speech fluent and appropriate Neurologic: grossly non-focal.          Labs on Admission:  Basic Metabolic Panel:  Recent Labs Lab 12/18/13 1335  NA 137  K 4.0  CL 94*  CO2 27  GLUCOSE 91  BUN 17  CREATININE 1.05  CALCIUM 9.9   Liver Function Tests:  Recent Labs Lab 12/18/13 1335  AST 17  ALT 12  ALKPHOS 98  BILITOT 0.6  PROT 8.7*  ALBUMIN 4.4   No results found for this basename: LIPASE, AMYLASE,  in the last 168 hours No results found for this basename: AMMONIA,  in the last 168 hours CBC:  Recent Labs Lab 12/18/13 1335  WBC 9.1  NEUTROABS 6.4  HGB 15.9  HCT 45.0  MCV 84.1  PLT 245   Cardiac Enzymes: No results found for this basename: CKTOTAL, CKMB, CKMBINDEX, TROPONINI,  in the last 168 hours  BNP (last 3 results) No results found for this basename: PROBNP,  in the last 8760 hours CBG: No results found for this basename: GLUCAP,  in the last 168 hours  Radiological Exams on Admission: No results found. Assessment/Plan  1. Abdominal pain/N/Vomiting -terminal ileitis based on CT, concern for IBD given persistence of CT findings -will admit, start on clears, IV cipro/flagyl -GI consulted, d/w Dr.Perry to eval for IBD  2. Mild dehydration -hydrate with NS  3. Polysubstance abuse -ETOH and cannabis -thiamine -counseled  DVT proph: lovenox  Code Status: Full Code Family Communication: d/w family at bedside Disposition Plan:   Time spent: 82mn  Shannen Flansburg [facility] Pager [phone]

## 2013-12-18 NOTE — ED Notes (Addendum)
Pt presents with complaint of abdominal pain and emesis. Pt reports generalized, intermittent, lower abdominal pain. Pt reports being seen on 11/14/13 for the same and was diagnosed with colitis and was given antibiotics and zofran. Pt states he was unable to obtain one of the antibiotics due to cost, however completed the other medications.  Pt reports tPt is A/O x4, in NAD, and vitals are WDL.

## 2013-12-18 NOTE — ED Provider Notes (Signed)
CSN: [id number]     Arrival date & time 12/18/13  1253 History   First MD Initiated Contact with Patient 12/18/13 1317     Chief Complaint  Patient presents with  . Abdominal Pain     HPI Pt presents with complaint of abdominal pain and emesis. Pt reports generalized, intermittent, lower abdominal pain. Pt reports being seen on 11/14/13 for the same and was diagnosed with colitis and was given antibiotics and zofran. Pt states he was unable to obtain one of the antibiotics due to cost, however completed the other medications.   Past Medical History  Diagnosis Date  . IBS (irritable bowel syndrome)   . ADHD (attention deficit hyperactivity disorder)    Past Surgical History  Procedure Laterality Date  . Wrist fracture surgery     No family history on file. History  Substance Use Topics  . Smoking status: Current Every Day Smoker  . Smokeless tobacco: Not on file  . Alcohol Use: Yes    Review of Systems  All other systems reviewed and are negative  Allergies  Review of patient's allergies indicates no known allergies.  Home Medications   Prior to Admission medications   Medication Sig Start Date End Date Taking? Authorizing Provider  ciprofloxacin (CIPRO) 500 MG tablet Take 1 tablet (500 mg total) by mouth every 12 (twelve) hours. 11/14/13   Tatyana A Kirichenko, PA-C  dicyclomine (BENTYL) 20 MG tablet Take 1 tablet (20 mg total) by mouth 2 (two) times daily. 11/14/13   Tatyana A Kirichenko, PA-C  HYDROcodone-acetaminophen (NORCO/VICODIN) 5-325 MG per tablet Take 1-2 tablets by mouth every 6 (six) hours as needed. 11/12/13   Antonietta Breach, PA-C  metroNIDAZOLE (FLAGYL) 500 MG tablet Take 1 tablet (500 mg total) by mouth 3 (three) times daily. 11/14/13   Tatyana A Kirichenko, PA-C  ondansetron (ZOFRAN) 8 MG tablet Take 1 tablet (8 mg total) by mouth every 8 (eight) hours as needed for nausea or vomiting. 11/14/13   Tatyana A Kirichenko, PA-C  promethazine (PHENERGAN) 25 MG tablet Take  1 tablet (25 mg total) by mouth every 6 (six) hours as needed for nausea or vomiting. 11/11/13   Wandra Arthurs, MD   BP 108/70  Pulse 52  Temp(Src) 97.9 F (36.6 C) (Oral)  Resp 16  Ht 5' 5" (1.651 m)  Wt 155 lb (70.308 kg)  BMI 25.79 kg/m2  SpO2 99% Physical Exam  Nursing note and vitals reviewed. Constitutional: He is oriented to person, place, and time. He appears well-developed and well-nourished. No distress.  HENT:  Head: Normocephalic and atraumatic.  Eyes: Pupils are equal, round, and reactive to light.  Neck: Normal range of motion.  Cardiovascular: Normal rate and intact distal pulses.   Pulmonary/Chest: No respiratory distress.  Abdominal: Normal appearance. He exhibits no distension. There is tenderness in the left lower quadrant. There is no rigidity, no rebound and no guarding.  Musculoskeletal: Normal range of motion.  Neurological: He is alert and oriented to person, place, and time. No cranial nerve deficit.  Skin: Skin is warm and dry. No rash noted.  Psychiatric: He has a normal mood and affect. His behavior is normal.    ED Course  Procedures (including critical care time)   Medications  ondansetron (ZOFRAN) injection 4 mg (4 mg Intravenous Given 12/20/13 0456)  ciprofloxacin (CIPRO) tablet 250 mg (250 mg Oral Not Given 12/20/13 0752)  dicyclomine (BENTYL) tablet 20 mg (20 mg Oral Not Given 12/20/13 0942)  metroNIDAZOLE (FLAGYL) tablet 500  mg (500 mg Oral Not Given 12/20/13 0942)  0.9 %  sodium chloride infusion ( Intravenous New Bag/Given 12/19/13 1844)  HYDROmorphone (DILAUDID) injection 0.5 mg (not administered)  enoxaparin (LOVENOX) injection 40 mg (40 mg Subcutaneous Given 12/19/13 1932)  thiamine (VITAMIN B-1) tablet 100 mg (100 mg Oral Not Given 12/20/13 0942)  sodium chloride 0.9 % bolus 1,000 mL (0 mLs Intravenous Stopped 12/18/13 1752)  ondansetron (ZOFRAN) injection 4 mg (4 mg Intravenous Given 12/18/13 1503)  fentaNYL (SUBLIMAZE) injection 100 mcg (100  mcg Intravenous Given 12/18/13 1503)  iohexol (OMNIPAQUE) 300 MG/ML solution 50 mL (50 mLs Oral Contrast Given 12/18/13 1517)  iohexol (OMNIPAQUE) 300 MG/ML solution 100 mL (100 mLs Intravenous Contrast Given 12/18/13 1605)  pneumococcal 23 valent vaccine (PNU-IMMUNE) injection 0.5 mL (0.5 mLs Intramuscular Given 12/19/13 0935)  peg 3350 powder (MOVIPREP) kit 100 g (100 g Oral Given 12/19/13 1800)    And  peg 3350 powder (MOVIPREP) kit 100 g (100 g Oral Given 12/20/13 0358)    Labs Review Labs Reviewed  COMPREHENSIVE METABOLIC PANEL - Abnormal; Notable for the following:    Chloride 94 (*)    Total Protein 8.7 (*)    All other components within normal limits  URINALYSIS, ROUTINE W REFLEX MICROSCOPIC - Abnormal; Notable for the following:    Color, Urine AMBER (*)    Specific Gravity, Urine 1.040 (*)    Bilirubin Urine SMALL (*)    Ketones, ur 15 (*)    All other components within normal limits  URINE RAPID DRUG SCREEN (HOSP PERFORMED) - Abnormal; Notable for the following:    Tetrahydrocannabinol POSITIVE (*)    All other components within normal limits  BASIC METABOLIC PANEL - Abnormal; Notable for the following:    Sodium 136 (*)    All other components within normal limits  CBC WITH DIFFERENTIAL  CBC   Discuss case with gastroenterology (Dr. Henrene Pastor), who recommended repeat CT scan.  Patient had active vomiting in the emergency department.  In light of a rising specific gravity and dropping serum chloride suspect dehydration.  Will be evaluated by hospitalist for possible admission. Imaging Review Ct Abdomen Pelvis W Contrast  12/18/2013   CLINICAL DATA:  Lower abdominal pain since March.  EXAM: CT ABDOMEN AND PELVIS WITH CONTRAST  TECHNIQUE: Multidetector CT imaging of the abdomen and pelvis was performed using the standard protocol following bolus administration of intravenous contrast.  CONTRAST:  40m OMNIPAQUE IOHEXOL 300 MG/ML SOLN, 1014mOMNIPAQUE IOHEXOL 300 MG/ML SOLN   COMPARISON:  CT ABD/PELVIS W CM dated 11/12/2013  FINDINGS: Bones: Sacroiliac joints appear within normal limits. No aggressive osseous lesions.  Lung Bases: Normal.  Liver: Mild periportal edema, probably related to resuscitation. No mass lesions.  Spleen:  Normal.  Gallbladder:  Normal.  Common bile duct:  Normal.  Pancreas:  Normal.  Adrenal glands:  Normal.  Kidneys: Normal enhancement. Ureters are poorly visualized but no dilation is evident. No calculi are seen. Paucity of abdominal fat degrades evaluation of solid and hollow abdominal viscera.  Stomach:  Normal.  Small bowel: Duodenum appears normal. No small bowel obstruction. There is persistent inflammation of the terminal ileum. No abscess. Mural thickening is present along with a small amount of ascites in the anatomic pelvis. This extends up to the ileocecal valve. Mural thickening of the ileum measures up to 12 millimeters.  Colon: There is no colonic mural thickening. Fluid levels are present in the proximal colon which are nonspecific but abnormal. Distal colon is collapsed.The  appendix is not identified.  Pelvic Genitourinary:    Normal.  Urinary bladder collapsed.  Vasculature: Normal.  Body Wall: Normal.  IMPRESSION: 1. Persistent inflammatory changes of the terminal ileum, likely representing Crohn's disease given persistence compared to 11/12/2013. Enteric infection is considered less likely. 2. Small amount of reactive ascites.   Electronically Signed   By: Dereck Ligas M.D.   On: 12/18/2013 16:35      MDM   Final diagnoses:  Ileitis, terminal  Abdominal pain  Alcohol abuse  Nausea and vomiting        Dot Lanes, MD 12/20/13 1109

## 2013-12-19 ENCOUNTER — Encounter (HOSPITAL_COMMUNITY): Payer: Self-pay | Admitting: Internal Medicine

## 2013-12-19 DIAGNOSIS — R109 Unspecified abdominal pain: Secondary | ICD-10-CM

## 2013-12-19 DIAGNOSIS — F1010 Alcohol abuse, uncomplicated: Secondary | ICD-10-CM

## 2013-12-19 DIAGNOSIS — K5000 Crohn's disease of small intestine without complications: Principal | ICD-10-CM

## 2013-12-19 DIAGNOSIS — R112 Nausea with vomiting, unspecified: Secondary | ICD-10-CM

## 2013-12-19 HISTORY — DX: Crohn's disease of small intestine without complications: K50.00

## 2013-12-19 LAB — BASIC METABOLIC PANEL
BUN: 11 mg/dL (ref 6–23)
CALCIUM: 8.8 mg/dL (ref 8.4–10.5)
CO2: 26 mEq/L (ref 19–32)
CREATININE: 0.98 mg/dL (ref 0.50–1.35)
Chloride: 99 mEq/L (ref 96–112)
GFR calc non Af Amer: >90 mL/min (ref >90)
Glucose, Bld: 84 mg/dL (ref 70–99)
Potassium: 4.2 mEq/L (ref 3.7–5.3)
Sodium: 136 mEq/L — ABNORMAL LOW (ref 137–147)

## 2013-12-19 LAB — CBC
HEMATOCRIT: 40.1 % (ref 39.0–52.0)
Hemoglobin: 13.7 g/dL (ref 13.0–17.0)
MCH: 29.1 pg (ref 26.0–34.0)
MCHC: 34.2 g/dL (ref 30.0–36.0)
MCV: 85.1 fL (ref 78.0–100.0)
PLATELETS: 203 10*3/uL (ref 150–400)
RBC: 4.71 MIL/uL (ref 4.22–5.81)
RDW: 12.4 % (ref 11.5–15.5)
WBC: 9.6 10*3/uL (ref 4.0–10.5)

## 2013-12-19 MED ORDER — PEG-KCL-NACL-NASULF-NA ASC-C 100 G PO SOLR
0.5000 | Freq: Once | ORAL | Status: AC
  Administered 2013-12-19: 100 g via ORAL
  Filled 2013-12-19: qty 1

## 2013-12-19 MED ORDER — PEG-KCL-NACL-NASULF-NA ASC-C 100 G PO SOLR
0.5000 | Freq: Once | ORAL | Status: AC
  Administered 2013-12-20: 100 g via ORAL
  Filled 2013-12-19: qty 1

## 2013-12-19 MED ORDER — PEG-KCL-NACL-NASULF-NA ASC-C 100 G PO SOLR: 1.0000 | Freq: Once | ORAL | Status: DC

## 2013-12-19 NOTE — Progress Notes (Signed)
[facility] PROGRESS NOTE  Philip Joseph WEX:[id number] DOB: [birth year] DOA: 12/18/2013 PCP: No primary provider on file.  Assessment/Plan: 1. Terminal ileitis  -persistent terminal ileitis based on CT, concern for IBD given persistence of CT findings  -continue clears, cipro/flagyl  -GI consulted to eval for IBD  -will need colonoscopy and terminal ileoscopy  2. Mild dehydration  -hydrate with NS   3. Polysubstance abuse  -ETOH and cannabis  -thiamine  -counseled   4. Poor socio-economic situation  DVT proph: lovenox   Code Status: Full Code  Family Communication: d/w grandfather at bedside  Disposition Plan: home pending workup    Antibiotics:  Cipro/Flagyl  HPI/Subjective: Feels better, no further N/V  Objective: Filed Vitals:   12/19/13 0620  BP: 106/67  Pulse: 67  Temp:   Resp:     Intake/Output Summary (Last 24 hours) at 12/19/13 1244 Last data filed at 12/19/13 1000  Gross per 24 hour  Intake 2621.25 ml  Output    250 ml  Net 2371.25 ml   Filed Weights   12/18/13 1900  Weight: 70.308 kg (155 lb)    Exam:   General:  AAOx3, strange flat affect  Cardiovascular: S1S2/RRR  Respiratory: CTAB  Abdomen: soft, NT, BS present  Musculoskeletal: no edema c/c   Data Reviewed: Basic Metabolic Panel:  Recent Labs Lab 12/18/13 1335 12/19/13 0352  NA 137 136*  K 4.0 4.2  CL 94* 99  CO2 27 26  GLUCOSE 91 84  BUN 17 11  CREATININE 1.05 0.98  CALCIUM 9.9 8.8   Liver Function Tests:  Recent Labs Lab 12/18/13 1335  AST 17  ALT 12  ALKPHOS 98  BILITOT 0.6  PROT 8.7*  ALBUMIN 4.4   No results found for this basename: LIPASE, AMYLASE,  in the last 168 hours No results found for this basename: AMMONIA,  in the last 168 hours CBC:  Recent Labs Lab 12/18/13 1335 12/19/13 0352  WBC 9.1 9.6  NEUTROABS 6.4  --   HGB 15.9 13.7  HCT 45.0 40.1  MCV 84.1 85.1  PLT 245 203   Cardiac Enzymes: No results found for this  basename: CKTOTAL, CKMB, CKMBINDEX, TROPONINI,  in the last 168 hours BNP (last 3 results) No results found for this basename: PROBNP,  in the last 8760 hours CBG: No results found for this basename: GLUCAP,  in the last 168 hours  No results found for this or any previous visit (from the past 240 hour(s)).   Studies: Ct Abdomen Pelvis W Contrast  12/18/2013   CLINICAL DATA:  Lower abdominal pain since March.  EXAM: CT ABDOMEN AND PELVIS WITH CONTRAST  TECHNIQUE: Multidetector CT imaging of the abdomen and pelvis was performed using the standard protocol following bolus administration of intravenous contrast.  CONTRAST:  2m OMNIPAQUE IOHEXOL 300 MG/ML SOLN, 1049mOMNIPAQUE IOHEXOL 300 MG/ML SOLN  COMPARISON:  CT ABD/PELVIS W CM dated 11/12/2013  FINDINGS: Bones: Sacroiliac joints appear within normal limits. No aggressive osseous lesions.  Lung Bases: Normal.  Liver: Mild periportal edema, probably related to resuscitation. No mass lesions.  Spleen:  Normal.  Gallbladder:  Normal.  Common bile duct:  Normal.  Pancreas:  Normal.  Adrenal glands:  Normal.  Kidneys: Normal enhancement. Ureters are poorly visualized but no dilation is evident. No calculi are seen. Paucity of abdominal fat degrades evaluation of solid and hollow abdominal viscera.  Stomach:  Normal.  Small bowel: Duodenum appears normal. No small bowel obstruction. There is persistent inflammation  of the terminal ileum. No abscess. Mural thickening is present along with a small amount of ascites in the anatomic pelvis. This extends up to the ileocecal valve. Mural thickening of the ileum measures up to 12 millimeters.  Colon: There is no colonic mural thickening. Fluid levels are present in the proximal colon which are nonspecific but abnormal. Distal colon is collapsed.The appendix is not identified.  Pelvic Genitourinary:    Normal.  Urinary bladder collapsed.  Vasculature: Normal.  Body Wall: Normal.  IMPRESSION: 1. Persistent inflammatory  changes of the terminal ileum, likely representing Crohn's disease given persistence compared to 11/12/2013. Enteric infection is considered less likely. 2. Small amount of reactive ascites.   Electronically Signed   By: Dereck Ligas M.D.   On: 12/18/2013 16:35    Scheduled Meds: . ciprofloxacin  250 mg Oral BID  . dicyclomine  20 mg Oral BID  . enoxaparin (LOVENOX) injection  40 mg Subcutaneous Q24H  . metroNIDAZOLE  500 mg Oral TID  . peg 3350 powder  0.5 kit Oral Once   And  . [START ON 12/20/2013] peg 3350 powder  0.5 kit Oral Once  . thiamine  100 mg Oral Daily   Continuous Infusions: . sodium chloride 75 mL/hr at 12/19/13 4098   Antibiotics Given (last 72 hours)   Date/Time Action Medication Dose   12/18/13 2054 Given   ciprofloxacin (CIPRO) tablet 250 mg 250 mg   12/18/13 2117 Given   metroNIDAZOLE (FLAGYL) tablet 500 mg 500 mg   12/19/13 0756 Given   ciprofloxacin (CIPRO) tablet 250 mg 250 mg   12/19/13 0932 Given   metroNIDAZOLE (FLAGYL) tablet 500 mg 500 mg      Active Problems:   Abdominal pain   Nausea and vomiting   Alcohol abuse    Time spent: 17mn    [facility] Pager [phone] If 7PM-7AM, please contact night-coverage at [email or web address], password [email or web address]4/27/2015, 12:44 PM  LOS: 1 day

## 2013-12-19 NOTE — Care Management Note (Signed)
    Page 1 of 1   12/19/2013     11:12:36 AM CARE MANAGEMENT NOTE 12/19/2013  Patient:  Philip Joseph, Philip Joseph   Account Number:  [id number]  Date Initiated:  12/19/2013  Documentation initiated by:  Sunday Spillers  Subjective/Objective Assessment:   23 yo male admitted with ileitis, IBD. PTA lived at home with grandparents.     Action/Plan:   Home when stable   Anticipated DC Date:  12/22/2013   Anticipated DC Plan:  [place]  CM consult      Choice offered to / List presented to:             Status of service:  Completed, signed off Medicare Important Message given?   (If response is "NO", the following Medicare IM given date fields will be blank) Date Medicare IM given:   Date Additional Medicare IM given:    Discharge Disposition:  HOME/SELF CARE  Per UR Regulation:  Reviewed for med. necessity/level of care/duration of stay  If discussed at [place] of Stay Meetings, dates discussed:    Comments:

## 2013-12-19 NOTE — Consult Note (Signed)
Consultation  Referring Provider: [facility] (Dr. Durenda Hurt)     Primary Care Physician:  No primary provider on file. Primary Gastroenterologist: none        Reason for Consultation: ilieitis             HPI:   Philip Joseph is a 23 y.o. male who presented to ED twice late last month with vomiting and lower abdominal pain. CTscan c/w ileitis. Patient discharge from ED on cipro and flagyl but was unable to get one of them filled  because of cost (likely couldn't fill the cipro). Patient came back to ED yesterday for persistent symptoms. Repeat CTscan revealed persistent inflammatory changes involving terminal ileum and small amount of ascites.   Patient felt fine up until mid March when he began having intermittent, diffuse lower abdominal pain and vomiting. Grandfather provides much of the history as patient having some problems comprehending what is being asked. Stools have been a little "runny" and more frequent than his baseline haven't contained any blood. Denies arthralgia, skin rash. No known family history of IBD.    Past Medical History  Diagnosis Date  . IBS (irritable bowel syndrome)   ADHD?  Past Surgical History  Procedure Laterality Date  . Wrist fracture surgery      Sebastian: No known IBD  History  Substance Use Topics  . Smoking status: Current Every Day Smoker  . Smokeless tobacco: Not on file  . Alcohol Use: Yes    Prior to Admission medications   Medication Sig Start Date End Date Taking? Authorizing Provider  ciprofloxacin (CIPRO) 500 MG tablet Take 1 tablet (500 mg total) by mouth every 12 (twelve) hours. 11/14/13   Tatyana A Kirichenko, PA-C  dicyclomine (BENTYL) 20 MG tablet Take 1 tablet (20 mg total) by mouth 2 (two) times daily. 11/14/13   Tatyana A Kirichenko, PA-C  HYDROcodone-acetaminophen (NORCO/VICODIN) 5-325 MG per tablet Take 1-2 tablets by mouth every 6 (six) hours as needed. 11/12/13   Antonietta Breach, PA-C  metroNIDAZOLE (FLAGYL) 500 MG  tablet Take 1 tablet (500 mg total) by mouth 3 (three) times daily. 11/14/13   Tatyana A Kirichenko, PA-C  ondansetron (ZOFRAN) 8 MG tablet Take 1 tablet (8 mg total) by mouth every 8 (eight) hours as needed for nausea or vomiting. 11/14/13   Tatyana A Kirichenko, PA-C  promethazine (PHENERGAN) 25 MG tablet Take 1 tablet (25 mg total) by mouth every 6 (six) hours as needed for nausea or vomiting. 11/11/13   Wandra Arthurs, MD    Current Facility-Administered Medications  Medication Dose Route Frequency Provider Last Rate Last Dose  . 0.9 %  sodium chloride infusion   Intravenous Continuous Domenic Polite, MD 75 mL/hr at 12/19/13 [phone]    . ciprofloxacin (CIPRO) tablet 250 mg  250 mg Oral BID Domenic Polite, MD   250 mg at 12/19/13 0756  . dicyclomine (BENTYL) tablet 20 mg  20 mg Oral BID Domenic Polite, MD   20 mg at 12/18/13 2117  . enoxaparin (LOVENOX) injection 40 mg  40 mg Subcutaneous Q24H Domenic Polite, MD   40 mg at 12/18/13 2054  . HYDROmorphone (DILAUDID) injection 0.5 mg  0.5 mg Intravenous Q3H PRN Domenic Polite, MD      . metroNIDAZOLE (FLAGYL) tablet 500 mg  500 mg Oral TID Domenic Polite, MD   500 mg at 12/18/13 2117  . ondansetron (ZOFRAN) injection 4 mg  4 mg Intravenous Q6H PRN Domenic Polite, MD   4  mg at 12/18/13 2122  . pneumococcal 23 valent vaccine (PNU-IMMUNE) injection 0.5 mL  0.5 mL Intramuscular Tomorrow-1000 Domenic Polite, MD      . thiamine (VITAMIN B-1) tablet 100 mg  100 mg Oral Daily Domenic Polite, MD   100 mg at 12/18/13 2054    Allergies as of 12/18/2013  . (No Known Allergies)    Review of Systems:    All systems reviewed and negative except where noted in HPI.    Physical Exam:  Vital signs in last 24 hours: Temp:  [97.6 F (36.4 C)-98.4 F (36.9 C)] 97.6 F (36.4 C) (04/27 0524) Pulse Rate:  [44-67] 67 (04/27 0620) Resp:  [14-19] 16 (04/27 0524) BP: (93-131)/(51-76) 106/67 mmHg (04/27 0620) SpO2:  [96 %-100 %] 100 % (04/27 0524) Weight:  [155 lb  (70.308 kg)] 155 lb (70.308 kg) (04/26 1900) Last BM Date: 12/18/13 General:   Healthy black male in NAD Head:  Normocephalic and atraumatic. Eyes:   No icterus.   Conjunctiva pink. Ears:  Normal auditory acuity. Neck:  Supple; no masses felt Lungs:  Respirations even and unlabored. Lungs clear to auscultation bilaterally.   No wheezes, crackles, or rhonchi.  Heart:  Regular rate and rhythm;  murmur heard. Abdomen:  Soft, nondistended, nontender. Normal bowel sounds. No appreciable masses or hepatomegaly.  Msk:  Symmetrical without gross deformities.  Extremities:  Without edema. Neurologic:  Alert and  oriented x4;  grossly normal neurologically. Skin:  Intact without significant lesions or rashes. Cervical Nodes:  No significant cervical adenopathy. Psych:  Alert and cooperative. Normal affect.  LAB RESULTS:  Recent Labs  12/18/13 1335 12/19/13 0352  WBC 9.1 9.6  HGB 15.9 13.7  HCT 45.0 40.1  PLT 245 203   BMET  Recent Labs  12/18/13 1335 12/19/13 0352  NA 137 136*  K 4.0 4.2  CL 94* 99  CO2 27 26  GLUCOSE 91 84  BUN 17 11  CREATININE 1.05 0.98  CALCIUM 9.9 8.8   LFT  Recent Labs  12/18/13 1335  PROT 8.7*  ALBUMIN 4.4  AST 17  ALT 12  ALKPHOS 98  BILITOT 0.6     Impression / Plan:   15. 23 year old black male with 1.5 month history of diffuse lower abdominal pain, nausea / vomiting, and bowel changes in setting of two CTscans revealing ileitis. Patient will need colonoscopy with TI intubation for further evaluation. Continue Cipro / flagyl for now  2. ADHD, currently off treatment. Patient seems somewhat slow to process basic information.   3. Difficult socioeconomic situation. Father incarcerated for years. Patient raised by grandparents (still lives with them)  who now have their own health problems. Patient unemployed, lost Medicaid.  Thanks   LOS: 1 day   Willia Craze  12/19/2013, 9:28 AM   [facility] Attending  I have also seen and  assessed the patient and agree with the above note. I have viewed CT scans. He may have Crohn's disease. Mom was here when I rounded and I spoke to both of them. The risks and benefits as well as alternatives of endoscopic procedure(s) have been discussed and reviewed. All questions answered. The patient agrees to proceed.  Colonoscopy tomorrow.  [name], MD, [facility] [phone] (pager) 12/19/2013 6:49 PM

## 2013-12-20 ENCOUNTER — Encounter (HOSPITAL_COMMUNITY)
Admission: EM | Disposition: A | Discharge status: Home / Self Care | Payer: Self-pay | Source: Home / Self Care | Attending: Internal Medicine

## 2013-12-20 ENCOUNTER — Encounter (HOSPITAL_COMMUNITY): Payer: Self-pay | Admitting: Internal Medicine

## 2013-12-20 DIAGNOSIS — F909 Attention-deficit hyperactivity disorder, unspecified type: Secondary | ICD-10-CM

## 2013-12-20 DIAGNOSIS — K56699 Other intestinal obstruction unspecified as to partial versus complete obstruction: Secondary | ICD-10-CM | POA: Diagnosis present

## 2013-12-20 DIAGNOSIS — K56609 Unspecified intestinal obstruction, unspecified as to partial versus complete obstruction: Secondary | ICD-10-CM

## 2013-12-20 HISTORY — PX: COLONOSCOPY: SHX5424

## 2013-12-20 SURGERY — COLONOSCOPY
Anesthesia: Moderate Sedation

## 2013-12-20 MED ORDER — DIPHENHYDRAMINE HCL 50 MG/ML IJ SOLN
INTRAMUSCULAR | Status: AC
  Filled 2013-12-20: qty 1

## 2013-12-20 MED ORDER — MIDAZOLAM HCL 5 MG/5ML IJ SOLN
INTRAMUSCULAR | Status: DC | PRN
  Administered 2013-12-20: 2 mg via INTRAVENOUS
  Administered 2013-12-20: 1 mg via INTRAVENOUS
  Administered 2013-12-20: 2 mg via INTRAVENOUS

## 2013-12-20 MED ORDER — METHYLPREDNISOLONE SODIUM SUCC 125 MG IJ SOLR
60.0000 mg | Freq: Every day | INTRAMUSCULAR | Status: DC
  Administered 2013-12-20 – 2013-12-21 (×2): 60 mg via INTRAVENOUS
  Filled 2013-12-20 (×4): qty 0.96

## 2013-12-20 MED ORDER — FENTANYL CITRATE 0.05 MG/ML IJ SOLN
INTRAMUSCULAR | Status: DC | PRN
  Administered 2013-12-20 (×2): 25 ug via INTRAVENOUS

## 2013-12-20 MED ORDER — MIDAZOLAM HCL 10 MG/2ML IJ SOLN
INTRAMUSCULAR | Status: AC
  Filled 2013-12-20: qty 4

## 2013-12-20 MED ORDER — FENTANYL CITRATE 0.05 MG/ML IJ SOLN
INTRAMUSCULAR | Status: AC
  Filled 2013-12-20: qty 4

## 2013-12-20 NOTE — Op Note (Signed)
[facility] [place], [id number]   COLONOSCOPY PROCEDURE REPORT  PATIENT: Philip Joseph, Philip Joseph  MR#: [id number] BIRTHDATE: 1991-08-11 , 22  yrs. old GENDER: Male ENDOSCOPIST: [name], MD, [facility] REFERRED QD:[facility] PROCEDURE DATE:  12/20/2013 PROCEDURE:   Colonoscopy with biopsy First Screening Colonoscopy - Avg.  risk and is 50 yrs.  old or older - No.  Prior Negative Screening - Now for repeat screening. N/A  History of Adenoma - Now for follow-up colonoscopy & has been > or = to 3 yrs.  N/A  Polyps Removed Today? No.  Recommend repeat exam, <10 yrs? No. ASA CLASS:   Class II INDICATIONS:an abnormal CT.   terminal ileitis on 2 CT's MEDICATIONS: Fentanyl 50 mcg IV and Versed 5 mg IV  DESCRIPTION OF PROCEDURE:   After the risks benefits and alternatives of the procedure were thoroughly explained, informed consent was obtained.  A digital rectal exam revealed no abnormalities of the rectum, A digital rectal exam revealed no prostatic nodules, and A digital rectal exam revealed the prostate was not enlarged.   The Pentax Ped Colon [id number]  endoscope was introduced through the anus and advanced to the cecum, which was identified by both the appendix and ileocecal valve. No adverse events experienced.   The quality of the prep was good, using MoviPrep  The instrument was then slowly withdrawn as the colon was fully examined.      COLON FINDINGS: Severe ileitis was found in the terminal ileum. There was edema, stricture and ulcer formation at IC valve/TI junction and scope could not enter ileum. Multiple biopsies were performed using cold forceps.   A normal appearing cecum, ileocecal valve, and appendiceal orifice were identified.  The ascending, hepatic flexure, transverse, splenic flexure, descending, sigmoid colon and rectum appeared unremarkable.  No polyps or cancers were seen.  Retroflexed views revealed no abnormalities.  The time to cecum=2 minutes 0 seconds.  Withdrawal time=8 minutes 0 seconds. The scope was withdrawn and the procedure completed. COMPLICATIONS: There were no complications.  ENDOSCOPIC IMPRESSION: 1.   Severe erosion and ileitis was found in the terminal ileum; multiple biopsies were performed using cold forceps 2.   Normal colon  RECOMMENDATIONS: 1.  Await biopsy results 2.  Start solumedrol for what is most likely Crohn's ileitis 3)  Quantiferon TB test 4) Vitamin D level 5)  continue cipro and metronidazole for now 6)  Full liquid diet   eSigned:  [name], MD, [facility] 12/20/2013 4:00 PM   cc: patient

## 2013-12-20 NOTE — Progress Notes (Addendum)
[facility] PROGRESS NOTE  KEYANDRE PILEGGI QIH:[id number] DOB: 04/10/1991 DOA: 12/18/2013 PCP: No primary provider on file. Brief narrative Philip Joseph is a 23 y.o. male with PMh of ADHD, has been seen in the ER 4 Times within the last month with Abdominal pain.  He was diagnosed with Terminal ileitis based on CT in 3/21 and was discharge home on pain medications and antibiotics. He now stays with his grandfather who brings him to the ER 4/26  with recurrence of Nausea, vomiting and diffuse intermittent, mostly lower quadrant abd pain since Thursday.  No fevers or chills, no hemetemesis, melena or hematochezia  He was supposed to FU with GI after ER visit but was unable to to pay the upfront cost and hence did not follow up.  In ER, CT with persistent terminal ileitis, admitted for GI evaluation to r/o IBD, on empiric Abx, for  Colonoscopy and biopsy 4/28  Assessment/Plan: 1. Terminal ileitis  -persistent terminal ileitis based on CT from 1 month back, concern for IBD given persistence of CT findings  -continue clears, cipro/flagyl  -GI following -colonoscopy and terminal ileoscopy today  2. Mild dehydration  -hydrate with NS   3. Polysubstance abuse  -ETOH and cannabis  -thiamine  -counseled   4. Poor socio-economic situation  DVT proph: lovenox   Code Status: Full Code  Family Communication: d/w grandfather at bedside  Disposition Plan: home pending workup    Antibiotics:  Cipro/Flagyl  HPI/Subjective: Feels better, no further N/V  Objective: Filed Vitals:   12/20/13 1610  BP: 113/53  Pulse:   Temp:   Resp: 22    Intake/Output Summary (Last 24 hours) at 12/20/13 1712 Last data filed at 12/20/13 1600  Gross per 24 hour  Intake   3180 ml  Output      0 ml  Net   3180 ml   Filed Weights   12/18/13 1900  Weight: 70.308 kg (155 lb)    Exam:   General:  AAOx3, strange flat affect  Cardiovascular: S1S2/RRR  Respiratory:  CTAB  Abdomen: soft, NT, BS present  Musculoskeletal: no edema c/c   Data Reviewed: Basic Metabolic Panel:  Recent Labs Lab 12/18/13 1335 12/19/13 0352  NA 137 136*  K 4.0 4.2  CL 94* 99  CO2 27 26  GLUCOSE 91 84  BUN 17 11  CREATININE 1.05 0.98  CALCIUM 9.9 8.8   Liver Function Tests:  Recent Labs Lab 12/18/13 1335  AST 17  ALT 12  ALKPHOS 98  BILITOT 0.6  PROT 8.7*  ALBUMIN 4.4   No results found for this basename: LIPASE, AMYLASE,  in the last 168 hours No results found for this basename: AMMONIA,  in the last 168 hours CBC:  Recent Labs Lab 12/18/13 1335 12/19/13 0352  WBC 9.1 9.6  NEUTROABS 6.4  --   HGB 15.9 13.7  HCT 45.0 40.1  MCV 84.1 85.1  PLT 245 203   Cardiac Enzymes: No results found for this basename: CKTOTAL, CKMB, CKMBINDEX, TROPONINI,  in the last 168 hours BNP (last 3 results) No results found for this basename: PROBNP,  in the last 8760 hours CBG: No results found for this basename: GLUCAP,  in the last 168 hours  No results found for this or any previous visit (from the past 240 hour(s)).   Studies: No results found.  Scheduled Meds: . ciprofloxacin  250 mg Oral BID  . dicyclomine  20 mg Oral BID  . enoxaparin (LOVENOX) injection  40 mg Subcutaneous Q24H  . metroNIDAZOLE  500 mg Oral TID  . thiamine  100 mg Oral Daily   Continuous Infusions: . sodium chloride 75 mL/hr at 12/19/13 1844   Antibiotics Given (last 72 hours)   Date/Time Action Medication Dose   12/18/13 2054 Given   ciprofloxacin (CIPRO) tablet 250 mg 250 mg   12/18/13 2117 Given   metroNIDAZOLE (FLAGYL) tablet 500 mg 500 mg   12/19/13 0756 Given   ciprofloxacin (CIPRO) tablet 250 mg 250 mg   12/19/13 0932 Given   metroNIDAZOLE (FLAGYL) tablet 500 mg 500 mg   12/19/13 1539 Given   metroNIDAZOLE (FLAGYL) tablet 500 mg 500 mg   12/19/13 1933 Given   ciprofloxacin (CIPRO) tablet 250 mg 250 mg   12/19/13 2121 Given   metroNIDAZOLE (FLAGYL) tablet 500 mg  500 mg      Active Problems:   Abdominal pain   Nausea and vomiting   Alcohol abuse   Ileitis, terminal   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

## 2013-12-21 ENCOUNTER — Encounter (HOSPITAL_COMMUNITY): Payer: Self-pay | Admitting: Internal Medicine

## 2013-12-21 DIAGNOSIS — E559 Vitamin D deficiency, unspecified: Secondary | ICD-10-CM

## 2013-12-21 HISTORY — DX: Vitamin D deficiency, unspecified: E55.9

## 2013-12-21 LAB — VITAMIN D 25 HYDROXY (VIT D DEFICIENCY, FRACTURES): Vit D, 25-Hydroxy: 22 ng/mL — ABNORMAL LOW (ref 30–89)

## 2013-12-21 NOTE — Progress Notes (Signed)
Patient ID: Philip Joseph, male   DOB: November 06, 1990, 23 y.o.   MRN: [id number]  [facility] PROGRESS NOTE  STANISLAUS KALTENBACH KPT:[id number] DOB: 06-Nov-1990 DOA: 12/18/2013 PCP: No primary provider on file.  Brief narrative: 23 y.o. male with ADHD, presented with abdominal pain. Recently diagnosed with terminal ileitis and admitted for further evaluation.   Assessment/Plan:  1. Terminal ileitis  - persistent terminal ileitis based on CT from 1 month back, concern for IBD given persistence of CT findings  - continue clears and advance diet as pt able to tolerate, continue cipro/flagyl  - GI following, appreciate input  - status post colonoscopy, biopsy consistent with Crohn's ileitis - place on on Prednisone and will possibly be able to go home tomorrow 2. Polysubstance abuse  - ETOH and cannabis  - counseled   DVT proph: lovenox   Code Status: Full Code  Family Communication: d/w grandfather at bedside  Disposition Plan: home in AM  Antibiotics:   Cipro/Flagyl Consultants:  GI  HPI/Subjective: No events overnight.   Objective: Filed Vitals:   12/20/13 1600 12/20/13 1610 12/20/13 2134 12/21/13 0604  BP: 98/35 113/53 106/72 98/72  Pulse:   63 50  Temp:   98.1 F (36.7 C) 97.7 F (36.5 C)  TempSrc:   Oral Oral  Resp: 9 22 18 18   Height:      Weight:      SpO2: 100% 100% 100% 98%    Intake/Output Summary (Last 24 hours) at 12/21/13 1017 Last data filed at 12/21/13 0600  Gross per 24 hour  Intake   2390 ml  Output      0 ml  Net   2390 ml    Exam:   General:  Pt is alert, follows commands appropriately, not in acute distress  Cardiovascular: Regular rate and rhythm, S1/S2, no murmurs, no rubs, no gallops  Respiratory: Clear to auscultation bilaterally, no wheezing, no crackles, no rhonchi  Abdomen: Soft, non tender, non distended, bowel sounds present, no guarding  Extremities: No edema, pulses DP and PT palpable bilaterally  Neuro:  Grossly nonfocal  Data Reviewed: Basic Metabolic Panel:  Recent Labs Lab 12/18/13 1335 12/19/13 0352  NA 137 136*  K 4.0 4.2  CL 94* 99  CO2 27 26  GLUCOSE 91 84  BUN 17 11  CREATININE 1.05 0.98  CALCIUM 9.9 8.8   Liver Function Tests:  Recent Labs Lab 12/18/13 1335  AST 17  ALT 12  ALKPHOS 98  BILITOT 0.6  PROT 8.7*  ALBUMIN 4.4   CBC:  Recent Labs Lab 12/18/13 1335 12/19/13 0352  WBC 9.1 9.6  NEUTROABS 6.4  --   HGB 15.9 13.7  HCT 45.0 40.1  MCV 84.1 85.1  PLT 245 203    Scheduled Meds: . ciprofloxacin  250 mg Oral BID  . dicyclomine  20 mg Oral BID  . enoxaparin (LOVENOX) injection  40 mg Subcutaneous Q24H  . methylPREDNISolone (SOLU-MEDROL) injection  60 mg Intravenous Daily  . metroNIDAZOLE  500 mg Oral TID  . thiamine  100 mg Oral Daily   Continuous Infusions: . sodium chloride 75 mL/hr at 12/21/13 0022     Theodis Blaze, MD  [facility] Pager [phone]  If 7PM-7AM, please contact night-coverage [email or web address] Password [email or web address] 12/21/2013, 10:17 AM   LOS: 3 days

## 2013-12-21 NOTE — Progress Notes (Addendum)
    Progress Note   Subjective  feels okay, wants to go home   Objective   Vital signs in last 24 hours: Temp:  [97.7 F (36.5 C)-98.6 F (37 C)] 97.7 F (36.5 C) (04/29 0604) Pulse Rate:  [46-63] 50 (04/29 0604) Resp:  [9-28] 18 (04/29 0604) BP: (97-123)/(35-72) 98/72 mmHg (04/29 0604) SpO2:  [98 %-100 %] 98 % (04/29 0604) Last BM Date: 12/20/13 General:    Black male in NAD Heart:  Regular rate and rhythm Abdomen:  Soft, nontender and nondistended. Normal bowel sounds. Extremities:  Without edema. Neurologic:  Alert and oriented,  grossly normal neurologically. Psych:  Cooperative. Normal mood and affect.    Assessment / Plan:    Ileitis,  Crohn's. Solumedrol started yesterday. Continue antibiotics for now. Awaiting biopsies. TB test pending (in case biologics needed). Vitamin D level pending. Long discussion with patient about possibility of Crohn's and what the disease is as well as likelihood that life long treatment will be needed. He needs to get back on Medicaid or get job with insurance if possible. He has a difficult social situation with neither parent available for support. He has untreated ADD to complicate things further    LOS: 3 days   Willia Craze  12/21/2013, 8:57 AM   Biopsies consistent with Crohn's ileitis. I explained to patient and grandfather. He is improved Will advance to low residue diet and he should probably be ok for dc tomorrow.  Prednisone 40 mg daily for 2 weeks then 30 mg daily. Use 10 mg tabs. REV in our office with me/Paula in 2 weeks I told grandfather I would write a letter in support of Medicaid which he lost after high school/18 yrs.  Also has vitamin D deficiency - not urgent to treat that.  Stopped Abx  [name], MD, [facility] [phone] (pager) 12/21/2013 5:42 PM

## 2013-12-21 NOTE — Progress Notes (Signed)
CSW consulted for current s/a. Pt has declined to speak with a CSW at this time. Please re consult CSW if pt changes his mind and would like assistance.  Treasure Ochs LCSW [phone]

## 2013-12-21 NOTE — Progress Notes (Signed)
Quick Note:  Handled at hospital - no letter/recall ______

## 2013-12-22 ENCOUNTER — Encounter: Payer: Self-pay | Admitting: Nurse Practitioner

## 2013-12-22 LAB — QUANTIFERON TB GOLD ASSAY (BLOOD)
INTERFERON GAMMA RELEASE ASSAY: NEGATIVE
Mitogen value: 0.87 IU/mL
Quantiferon Nil Value: 0.02 IU/mL
TB Ag value: 0.02 IU/mL
TB Antigen Minus Nil Value: 0 IU/mL

## 2013-12-22 MED ORDER — PREDNISONE 10 MG PO TABS: ORAL_TABLET | ORAL | Status: DC

## 2013-12-22 MED ORDER — HYDROCODONE-ACETAMINOPHEN 5-325 MG PO TABS: 1.0000 | ORAL_TABLET | Freq: Four times a day (QID) | ORAL | Status: DC | PRN

## 2013-12-22 MED ORDER — PROMETHAZINE HCL 25 MG PO TABS: 25.0000 mg | ORAL_TABLET | Freq: Four times a day (QID) | ORAL | Status: DC | PRN

## 2013-12-22 MED ORDER — ONDANSETRON HCL 8 MG PO TABS: 8.0000 mg | ORAL_TABLET | Freq: Three times a day (TID) | ORAL | Status: DC | PRN

## 2013-12-22 NOTE — Care Management Note (Signed)
Cm spoke with patient at the bedside concerning discharge planning. No PCP on record. Pt provided with information concerning [facility]. Pt's prescription meds are generic, pt not eligible for [facility] assistance. Pt instructed to consult [facility] for further assistance with meds. Financial Counselor to consult prior to discharge. Pt has transportation at discharge.    Venita Lick Keah Lamba,MSN,RN [phone]

## 2013-12-22 NOTE — Discharge Instructions (Signed)
Crohn's Disease Crohn's disease is a long-term (chronic) soreness and redness (inflammation) of the intestines (bowel). It can affect any portion of the digestive tract, from the mouth to the anus. It can also cause problems outside the digestive tract. Crohn's disease is closely related to a disease called ulcerative colitis (together, these two diseases are called inflammatory bowel disease).  CAUSES  The cause of Crohn's disease is not known. One Link Snuffer is that, in an easily affected person, the immune system is triggered to attack the body's own digestive tissue. Crohn's disease runs in families. It seems to be more common in certain geographic areas and amongst certain races. There are no clear-cut dietary causes.  SYMPTOMS  Crohn's disease can cause many different symptoms since it can affect many different parts of the body. Symptoms include:  Fatigue.  Weight loss.  Chronic diarrhea, sometime bloody.  Abdominal pain and cramps.  Fever.  Ulcers or canker sores in the mouth or rectum.  Anemia (low red blood cells).  Arthritis, skin problems, and eye problems may occur. Complications of Crohn's disease can include:  Series of holes (perforation) of the bowel.  Portions of the intestines sticking to each other (adhesions).  Obstruction of the bowel.  Fistula formation, typically in the rectal area but also in other areas. A fistula is an opening between the bowels and the outside, or between the bowels and another organ.  A painful crack in the mucous membrane of the anus (rectal fissure). DIAGNOSIS  Your caregiver may suspect Crohn's disease based on your symptoms and an exam. Blood tests may confirm that there is a problem. You may be asked to submit a stool specimen for examination. X-rays and CT scans may be necessary. Ultimately, the diagnosis is usually made after a procedure that uses a flexible tube that is inserted via your mouth or your anus. This is done under  sedation and is called either an upper endoscopy or colonoscopy. With these tests, the specialist can take tiny tissue samples and remove them from the inside of the bowel (biopsy). Examination of this biopsy tissue under a microscope can reveal Crohn's disease as the cause of your symptoms. Due to the many different forms that Crohn's disease can take, symptoms may be present for several years before a diagnosis is made. TREATMENT  Medications are often used to decrease inflammation and control the immune system. These include medicines related to aspirin, steroid medications, and newer and stronger medications to slow down the immune system. Some medications may be used as suppositories or enemas. A number of other medications are used or have been studied. Your caregiver will make specific recommendations. HOME CARE INSTRUCTIONS   Symptoms such as diarrhea can be controlled with medications. Avoid foods that have a laxative effect such as fresh fruit, vegetables and dairy products. During flare ups, you can rest your bowel by refraining from solid foods. Drink clear liquids frequently during the day (electrolyte or re-hydrating fluids are best. Your caregiver can help you with suggestions). Drink often to prevent loss of body fluids (dehydration). When diarrhea has cleared, eat small meals and more frequently. Avoid food additives and stimulants such as caffeine (coffee, tea, or chocolate). Enzyme supplements may help if you develop intolerance to a sugar in dairy products (lactose). Ask your caregiver or dietitian about specific dietary instructions.  Try to maintain a positive attitude. Learn relaxation techniques such as self hypnosis, mental imaging, and muscle relaxation.  If possible, avoid stresses which can aggravate your condition.  Exercise regularly.  Follow your diet.  Always get plenty of rest. SEEK MEDICAL CARE IF:   Your symptoms fail to improve after a week or two of new  treatment.  You experience continued weight loss.  You have ongoing cramps or loose bowels.  You develop a new skin rash, skin sores, or eye problems. SEEK IMMEDIATE MEDICAL CARE IF:   You have worsening of your symptoms or develop new symptoms.  You have a fever.  You develop bloody diarrhea.  You develop severe abdominal pain. MAKE SURE YOU:   Understand these instructions.  Will watch your condition.  Will get help right away if you are not doing well or get worse. Document Released: 05/21/2005 Document Revised: 12/06/2012 Document Reviewed: 04/19/2007 [facility] Patient Information 2014 [place], [place].

## 2013-12-22 NOTE — Progress Notes (Signed)
    Progress Note   Subjective  feels well   Objective   Vital signs in last 24 hours: Temp:  [98.1 F (36.7 C)-98.6 F (37 C)] 98.4 F (36.9 C) (04/30 0558) Pulse Rate:  [47-61] 52 (04/30 0558) Resp:  [16-18] 18 (04/30 0558) BP: (102-110)/(52-66) 102/55 mmHg (04/30 0558) SpO2:  [97 %-98 %] 97 % (04/30 0558) Last BM Date: 12/21/13 General:    Black male in NAD Heart:  Regular rate and rhythm Lungs: Respirations even and unlabored, lungs CTA bilaterally Abdomen:  Soft, nontender and nondistended. Normal bowel sounds. Extremities:  Without edema. Neurologic:  Alert and oriented,  grossly normal neurologically. Psych:  Cooperative. Normal mood and affect.    Assessment / Plan:   1. Crohn's ileitis, improving on steroids, home on Prednisone as written yesterday.. I have made him a follow up appointment in 3 weeks. Reiterated importance of medication and appointment adherence.  2. Tobacco abuse. Advised to stop smoking, especially given newly diagnosed Crohn's    LOS: 4 days   Willia Craze  12/22/2013, 8:50 AM   [facility] Attending  I have also seen and assessed the patient and agree with the above note. [name], MD, [facility] [phone] (pager) 12/22/2013 2:23 PM

## 2013-12-22 NOTE — Discharge Summary (Signed)
Physician Discharge Summary  Philip Joseph AQT:[id number] DOB: 08/31/1990 DOA: 12/18/2013  PCP: No primary provider on file.  Admit date: 12/18/2013 Discharge date: 12/22/2013  Recommendations for Outpatient Follow-up:  1. Pt will need to follow up with PCP in 2-3 weeks post discharge 2. Please obtain BMP to evaluate electrolytes and kidney function 3. 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 male with ADHD, presented with abdominal pain. Recently diagnosed with terminal ileitis and admitted for further evaluation.  Assessment/Plan:   1. Terminal ileitis  - persistent terminal ileitis based on CT from 1 month back, concern for IBD given persistence of CT findings  - advanced diet to regular and pt tolerating well  - GI following, appreciate input  - status post colonoscopy, biopsy consistent with Crohn's ileitis  - place on on Prednisone tapering as noted below  2. Polysubstance abuse  - ETOH and cannabis  - counseled   DVT proph: lovenox while inpatient   Code Status: Full Code  Family Communication: d/w grandfather at bedside   Antibiotics:  Cipro/Flagyl stopped upon discharge  Consultants:  GI  Discharge Exam: Filed Vitals:   12/22/13 0558  BP: 102/55  Pulse: 52  Temp: 98.4 F (36.9 C)  Resp: 18   Filed Vitals:   12/21/13 0604 12/21/13 1430 12/21/13 2206 12/22/13 0558  BP: 98/72 110/52 107/66 102/55  Pulse: 50 47 61 52  Temp: 97.7 F (36.5 C) 98.6 F (37 C) 98.1 F (36.7 C) 98.4 F (36.9 C)  TempSrc: Oral Oral Oral Oral  Resp: 18 16 18 18   Height:      Weight:      SpO2: 98% 98% 97% 97%    General:  Pt is alert, follows commands appropriately, not in acute distress Cardiovascular: Regular rate and rhythm, S1/S2 +, no murmurs, no rubs, no gallops Respiratory: Clear to auscultation bilaterally, no wheezing, no crackles, no rhonchi Abdominal: Soft, non tender, non distended, bowel sounds +, no guarding Extremities: no edema, no cyanosis, pulses palpable bilaterally DP and PT Neuro: Grossly nonfocal  Discharge Instructions  Discharge Orders   Future Appointments Provider Department Dept Phone   01/10/2014 9:30 AM Willia Craze, NP [facility] [phone]   Future Orders Complete By Expires   Diet - low sodium heart healthy  As directed    Increase activity slowly  As directed        Medication List    STOP taking these medications       ciprofloxacin 500 MG tablet  Commonly known as:  CIPRO     metroNIDAZOLE 500 MG tablet  Commonly known as:  FLAGYL      TAKE these medications       dicyclomine 20 MG tablet  Commonly known as:  BENTYL  Take 1 tablet (20 mg total) by mouth 2 (two) times daily.     HYDROcodone-acetaminophen 5-325 MG per tablet  Commonly known as:  NORCO/VICODIN  Take 1-2 tablets by mouth every 6 (six) hours as needed.     ondansetron 8 MG tablet  Commonly known as:  ZOFRAN  Take 1 tablet (8 mg total) by mouth  every 8 (eight) hours as needed for nausea or vomiting.     predniSONE 10 MG tablet  Commonly known as:  DELTASONE  Take 40 mg tablet daily for 2 weeks (until May 14th, 2015), continue taking 30 mg tablet daily starting May 15th, 2015.     promethazine 25 MG tablet  Commonly known as:  PHENERGAN  Take 1 tablet (25 mg total) by mouth every 6 (six) hours as needed for nausea or vomiting.           Follow-up Information   Follow up with Tye Savoy, NP On 01/10/2014. (at 9:30)    Specialty:  Nurse Practitioner   Contact information:   [address] [place] [address] [phone]       Follow up with  Faye Ramsay, MD.   Specialty:  Internal Medicine   Contact information:   [address] [place] [address] [phone]        The results of significant diagnostics from this hospitalization (including imaging, microbiology, ancillary and laboratory) are listed below for reference.     Microbiology: No results found for this or any previous visit (from the past 240 hour(s)).   Labs: Basic Metabolic Panel:  Recent Labs Lab 12/18/13 1335 12/19/13 0352  NA 137 136*  K 4.0 4.2  CL 94* 99  CO2 27 26  GLUCOSE 91 84  BUN 17 11  CREATININE 1.05 0.98  CALCIUM 9.9 8.8   Liver Function Tests:  Recent Labs Lab 12/18/13 1335  AST 17  ALT 12  ALKPHOS 98  BILITOT 0.6  PROT 8.7*  ALBUMIN 4.4   No results found for this basename: LIPASE, AMYLASE,  in the last 168 hours No results found for this basename: AMMONIA,  in the last 168 hours CBC:  Recent Labs Lab 12/18/13 1335 12/19/13 0352  WBC 9.1 9.6  NEUTROABS 6.4  --   HGB 15.9 13.7  HCT 45.0 40.1  MCV 84.1 85.1  PLT 245 203   Cardiac Enzymes: No results found for this basename: CKTOTAL, CKMB, CKMBINDEX, TROPONINI,  in the last 168 hours BNP: BNP (last 3 results) No results found for this basename: PROBNP,  in the last 8760 hours CBG: No results found for this basename: GLUCAP,  in the last 168 hours   SIGNED: Time coordinating discharge: Over 30 minutes  Theodis Blaze, MD  [facility] 12/22/2013, 10:59 AM Pager [phone]  If 7PM-7AM, please contact night-coverage [email or web address] Password [email or web address]

## 2013-12-30 ENCOUNTER — Emergency Department (HOSPITAL_COMMUNITY)
Admission: EM | Admit: 2013-12-30 | Discharge: 2013-12-30 | Disposition: A | Discharge status: Home / Self Care | Payer: Medicaid Other | Attending: Emergency Medicine | Admitting: Emergency Medicine

## 2013-12-30 ENCOUNTER — Encounter (HOSPITAL_COMMUNITY): Payer: Self-pay | Admitting: Emergency Medicine

## 2013-12-30 DIAGNOSIS — K589 Irritable bowel syndrome without diarrhea: Secondary | ICD-10-CM | POA: Insufficient documentation

## 2013-12-30 DIAGNOSIS — Z862 Personal history of diseases of the blood and blood-forming organs and certain disorders involving the immune mechanism: Secondary | ICD-10-CM | POA: Insufficient documentation

## 2013-12-30 DIAGNOSIS — F17200 Nicotine dependence, unspecified, uncomplicated: Secondary | ICD-10-CM | POA: Insufficient documentation

## 2013-12-30 DIAGNOSIS — Z046 Encounter for general psychiatric examination, requested by authority: Secondary | ICD-10-CM | POA: Diagnosis present

## 2013-12-30 DIAGNOSIS — F919 Conduct disorder, unspecified: Secondary | ICD-10-CM | POA: Insufficient documentation

## 2013-12-30 DIAGNOSIS — Z8639 Personal history of other endocrine, nutritional and metabolic disease: Secondary | ICD-10-CM | POA: Insufficient documentation

## 2013-12-30 DIAGNOSIS — Z79899 Other long term (current) drug therapy: Secondary | ICD-10-CM | POA: Insufficient documentation

## 2013-12-30 DIAGNOSIS — R4689 Other symptoms and signs involving appearance and behavior: Secondary | ICD-10-CM
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
## 2013-12-30 NOTE — ED Provider Notes (Signed)
Medical screening examination/treatment/procedure(s) were conducted as a shared visit with non-physician practitioner(s) and myself.  I personally evaluated the patient during the encounter.   EKG Interpretation None      Pt brought by police with IVC papers taken out by grandfather for medication and diet non-compliance. Pt does not meet criteria for IVC. Papers reversed. Grandfather given information for outpatient resources.   Charles B. Karle Starch, MD 12/30/13 2159

## 2013-12-30 NOTE — ED Notes (Signed)
Pt was brought in by [employer] w/ IVC papers, IVC papers were taken out by grandparents, said pt has hx of Chron's disease and does not follow his diet, paperwork states pt wants to run away w/ girlfriend. Pt cooperative in triage.
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
## 2013-12-30 NOTE — ED Notes (Signed)
Our social worker, Joelene Millin is speaking with him as I write this.  He continues to be comfortable, calm and cooperative.

## 2013-12-30 NOTE — Discharge Instructions (Signed)
Use the resources given to you to help establish care with psychiatrist/counselor to discuss further concerns. Return to the ED for new concerns-- suicidal ideation, homicidal ideation, alcohol abuse, drug abuse, etc.

## 2013-12-31 ENCOUNTER — Telehealth: Payer: Self-pay | Admitting: Nurse Practitioner

## 2013-12-31 NOTE — Telephone Encounter (Signed)
Philip Joseph was diagnosed with Crohn's disease a couple of weeks ago (refer to those hospital notes). He was discharged on Prednisone with plans to follow up with [place] in clinic. Patient's grandfather called office today - patient was involuntarily taken to [facility] for psychiatric evaluation. I spoke to grandmother, she is concerned that Philip Joseph sleeps all day and goes out at night. She is concerned he may be engaging in unhealthy activities (?drugs). Refer to our recent hospital notes for more detail but Philip Joseph has had a difficult upbring. In addition he has a history of psychiatric problems and has had difficulty maintaining employment. Grandparents are doing everything they can to help Philip Joseph but are limited by their own health problems and fixed income. I suggested they assist patient with getting a PCP who can provide further evaluation and treatment of whatever underlying psychiatric illnesses are present. I reiterated need for patient to make his follow up visit here. He is apparently being compliant with prednisone.

## 2014-01-10 ENCOUNTER — Encounter: Payer: Self-pay | Admitting: Nurse Practitioner

## 2014-01-10 ENCOUNTER — Ambulatory Visit (INDEPENDENT_AMBULATORY_CARE_PROVIDER_SITE_OTHER): Payer: Self-pay | Admitting: Nurse Practitioner

## 2014-01-10 VITALS — BP 90/62 | HR 66 | Ht 65.0 in | Wt 136.2 lb

## 2014-01-10 DIAGNOSIS — F909 Attention-deficit hyperactivity disorder, unspecified type: Secondary | ICD-10-CM

## 2014-01-10 DIAGNOSIS — K5000 Crohn's disease of small intestine without complications: Secondary | ICD-10-CM

## 2014-01-10 DIAGNOSIS — E559 Vitamin D deficiency, unspecified: Secondary | ICD-10-CM

## 2014-01-10 MED ORDER — HYDROCODONE-ACETAMINOPHEN 5-325 MG PO TABS: ORAL_TABLET | ORAL | Status: DC

## 2014-01-10 NOTE — Patient Instructions (Addendum)
Prednisone Dose  Decrease to 20 mg (2 of the 10 mg tablets daily) and take this for 7 days.  Then decrease to 72m ( one and a half of the 143mdaily) for 7 days. Then decrease to 1070maily and stay on this dose until your next appointment with us.[place] us [place] you start having worsening abdominal pain, nausea, diarrhea or passing blood in stool.    We made you an appointment to see Dr. GesCarlean Purl 02-28-2014 at 9:45 am.  You stopped smoking, Congratulations !!

## 2014-01-10 NOTE — Progress Notes (Signed)
History of Present Illness:  Philip Joseph is a 23 year old male who we recently diagnosed with crohn's ileitis. He had a nice response to IV steroids and continues to improve on Prednisone. He describes a daily BM without blood. Abdominal pain has significantly improved and he hasn't needed required any anti-emetics. He inquires about any dietary restrictions. He hasn't smoked since hospital discharge 12/22/13.  Current Medications, Allergies, Past Medical History, Past Surgical History, Family History and Social History were reviewed in [employer] record.  Studies:   Ct Abdomen Pelvis W Contrast  12/18/2013   CLINICAL DATA:  Lower abdominal pain since March.  EXAM: CT ABDOMEN AND PELVIS WITH CONTRAST  TECHNIQUE: Multidetector CT imaging of the abdomen and pelvis was performed using the standard protocol following bolus administration of intravenous contrast.  CONTRAST:  33m OMNIPAQUE IOHEXOL 300 MG/ML SOLN, 1029mOMNIPAQUE IOHEXOL 300 MG/ML SOLN  COMPARISON:  CT ABD/PELVIS W CM dated 11/12/2013  FINDINGS: Bones: Sacroiliac joints appear within normal limits. No aggressive osseous lesions.  Lung Bases: Normal.  Liver: Mild periportal edema, probably related to resuscitation. No mass lesions.  Spleen:  Normal.  Gallbladder:  Normal.  Common bile duct:  Normal.  Pancreas:  Normal.  Adrenal glands:  Normal.  Kidneys: Normal enhancement. Ureters are poorly visualized but no dilation is evident. No calculi are seen. Paucity of abdominal fat degrades evaluation of solid and hollow abdominal viscera.  Stomach:  Normal.  Small bowel: Duodenum appears normal. No small bowel obstruction. There is persistent inflammation of the terminal ileum. No abscess. Mural thickening is present along with a small amount of ascites in the anatomic pelvis. This extends up to the ileocecal valve. Mural thickening of the ileum measures up to 12 millimeters.  Colon: There is no colonic mural thickening. Fluid  levels are present in the proximal colon which are nonspecific but abnormal. Distal colon is collapsed.The appendix is not identified.  Pelvic Genitourinary:    Normal.  Urinary bladder collapsed.  Vasculature: Normal.  Body Wall: Normal.  IMPRESSION: 1. Persistent inflammatory changes of the terminal ileum, likely representing Crohn's disease given persistence compared to 11/12/2013. Enteric infection is considered less likely. 2. Small amount of reactive ascites.   Electronically Signed   By: GeDereck Ligas.D.   On: 12/18/2013 16:35   Colonoscopy 12/20/13 ENDOSCOPIC IMPRESSION:  1. Severe erosion and ileitis was found in the terminal ileum;  multiple biopsies were performed using cold forceps  2. Normal colon  ileal biopses: ulcerated, chronic active ileitis   Physical Exam: General: Pleasant, well developed , black male in no acute distress Head: Normocephalic and atraumatic Eyes:  sclerae anicteric, conjunctiva pink  Ears: Normal auditory acuity Lungs: Clear throughout to auscultation Heart: Regular rate and rhythm Abdomen: Soft, non distended, non-tender. No masses, no hepatomegaly. Normal bowel sounds Musculoskeletal: Symmetrical with no gross deformities  Extremities: No edema  Neurological: Alert oriented x 4, grossly nonfocal Psychological:  Alert and cooperative. Normal mood and affect  Assessment and Recommendations:  1.89181ear old black male with recently diagnosed Crohn's ileitis, significantly improved on prednisone. Plan is for slow steroid taper to 1088maily and follow up appt here in 3-4 weeks. Recommended he stay on low fiber diet for now. Philip Joseph will eventually need immunomodulatory therapy and / or biologics but we will need to know that he is going to be compliant with meds and follow up appointments.  He is applying for Medicaid and depending on how that  goes he may need to look into some patient assistance programs as well. See #2  2. Difficult social  situation. Apparently patient was adopted / raised by grandparents as neither parent around or capable of providing care. Patient has had some psychological problems, possibly illicit drug use. He has had difficulty maintaining employment. Grandparents very concerned about patient but they have their own health problems and have limited funds to assist Philip Joseph with medical bills / medications.  Patient knows to call between now and follow up appointment should he have worsening abdominal pain or other flare type symptoms. I refilled Hydrocodone, but only #15.   3. History of ADD, currently untreated.   4. Vitamin D deficiency. He will need treatment for this, again money is an issue right now.   5. History of tobacco abuse. We discussed this in hospital. He hasn't smoked since hospital discharge.

## 2014-01-11 NOTE — Progress Notes (Signed)
Agree with Ms. Guenther's assessment and plan. [name], MD, Marval Regal

## 2014-02-01 ENCOUNTER — Telehealth: Payer: Self-pay | Admitting: Nurse Practitioner

## 2014-02-01 NOTE — Telephone Encounter (Signed)
Left message for pt to call back  °

## 2014-02-01 NOTE — Telephone Encounter (Signed)
We can refill hydrocodone Re: letter I can do that but does he have a name/address, etc?  When is f/u? What dose of prednisone is he on right now?

## 2014-02-01 NOTE — Telephone Encounter (Signed)
Discussed with pt that the prednisone can cause facial swelling or moon face and acne and that when he tapers off of the med it should go away. Pt is asking for a refill of his hydrocodone and also states he was supposed to have a letter written for his medicaid. Pt states he discussed the letter with Tye Savoy NP. Please advise.

## 2014-02-01 NOTE — Telephone Encounter (Signed)
Pt to call back with prednisone dose and letter contact information. Pt is scheduled for OV 02/28/14@9 :45am. Pt will pick up script tomorrow.

## 2014-02-03 ENCOUNTER — Other Ambulatory Visit: Payer: Self-pay

## 2014-02-03 MED ORDER — HYDROCODONE-ACETAMINOPHEN 5-325 MG PO TABS: ORAL_TABLET | ORAL | Status: DC

## 2014-02-03 NOTE — Telephone Encounter (Signed)
Spoke with pts grandfather and pt was still taking 58m of prednisone. Reviewed taper instructions per OV note and pt to start 286mdaily X7, then 15 daily X7, then stay on 1085mil follow-up visit on 02/28/14. Pt will come today to pick up script for pain med.   Letter needs to be sent to: Attn: Adult Medicaid [id number] [place] [phone]

## 2014-02-15 ENCOUNTER — Telehealth: Payer: Self-pay

## 2014-02-15 ENCOUNTER — Encounter: Payer: Self-pay | Admitting: Internal Medicine

## 2014-02-15 NOTE — Telephone Encounter (Signed)
Spoke to grandmother regarding letter Dr. Carlean Purl typed up for Said.  Copy will be mailed to patient and also to Adult Medicaid.

## 2014-02-28 ENCOUNTER — Encounter: Payer: Self-pay | Admitting: Internal Medicine

## 2014-02-28 ENCOUNTER — Ambulatory Visit (INDEPENDENT_AMBULATORY_CARE_PROVIDER_SITE_OTHER): Payer: Self-pay | Admitting: Internal Medicine

## 2014-02-28 VITALS — BP 98/66 | HR 64 | Ht 65.0 in | Wt 134.8 lb

## 2014-02-28 DIAGNOSIS — K56699 Other intestinal obstruction unspecified as to partial versus complete obstruction: Secondary | ICD-10-CM

## 2014-02-28 DIAGNOSIS — Z598 Other problems related to housing and economic circumstances: Secondary | ICD-10-CM

## 2014-02-28 DIAGNOSIS — K5000 Crohn's disease of small intestine without complications: Secondary | ICD-10-CM

## 2014-02-28 DIAGNOSIS — Z5989 Other problems related to housing and economic circumstances: Secondary | ICD-10-CM

## 2014-02-28 DIAGNOSIS — K56609 Unspecified intestinal obstruction, unspecified as to partial versus complete obstruction: Secondary | ICD-10-CM

## 2014-02-28 DIAGNOSIS — K50012 Crohn's disease of small intestine with intestinal obstruction: Secondary | ICD-10-CM

## 2014-02-28 MED ORDER — PREDNISONE 10 MG PO TABS: 20.0000 mg | ORAL_TABLET | Freq: Every day | ORAL | Status: DC

## 2014-02-28 MED ORDER — DICYCLOMINE HCL 20 MG PO TABS: 20.0000 mg | ORAL_TABLET | Freq: Four times a day (QID) | ORAL | Status: DC

## 2014-02-28 NOTE — Progress Notes (Signed)
   Subjective:    Patient ID: Philip Joseph, male    DOB: Jul 11, 1991, 23 y.o.   MRN: [id number]  HPI Here with his grandmother to f/u Crohn's ileitis. Occasional pain No diarrhea or bleeding Working temp job - labor Lives with best friend Still uninsured Medications, allergies, past medical history, past surgical history, family history and social history are reviewed and updated in the EMR.  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 Will also give info re: [employer].

## 2014-02-28 NOTE — Patient Instructions (Signed)
Today you have been given printed rx's to take to the pharmacy for prednisone and dicyclomine.  Today you have been given a one year complimentary membership to the Crohn's and [place] form to mail off.  Follow up with [place] in 2 months.  Today we are providing you with reading information on Biologic Therapies.  Dr. Silvano Rusk is going to look into assistance programs for biologic therapy medicines.   I appreciate the opportunity to care for you.

## 2014-02-28 NOTE — Assessment & Plan Note (Addendum)
Improved Taper prednisone Try to get biologic CCFA infr biologics, Crohn's and CCFA membership RTC 2 months

## 2014-03-01 DIAGNOSIS — Z5989 Other problems related to housing and economic circumstances: Secondary | ICD-10-CM | POA: Insufficient documentation

## 2014-03-01 DIAGNOSIS — Z598 Other problems related to housing and economic circumstances: Secondary | ICD-10-CM | POA: Insufficient documentation

## 2014-03-01 NOTE — Assessment & Plan Note (Signed)
Symptomatically improved

## 2014-03-01 NOTE — Assessment & Plan Note (Addendum)
I have sent a letter to see if it would help him get Medicaid. Will also give info re: [employer].

## 2014-03-02 ENCOUNTER — Telehealth: Payer: Self-pay

## 2014-03-02 NOTE — Telephone Encounter (Signed)
Application for Humira, for financial assistance with [facility] and contact information to [facility] provided to patient

## 2014-03-02 NOTE — Telephone Encounter (Signed)
Message copied by Greggory Keen on Thu Mar 02, 2014 10:47 AM ------      Message from: Marlon Pel      Created: Thu Mar 02, 2014  8:41 AM      Regarding: FW: help with meds, ? orange card                   ----- Message -----         From: [name], MD         Sent: 03/01/2014   9:25 PM           To: Kellie Moor, RN      Subject: help with meds, ? orange card                            Please see what we can do for him re: free Humira or Cimzia            Also can we get him info re: [facility] network (orange card)            May want to give to his grandmother ------

## 2014-03-28 ENCOUNTER — Ambulatory Visit: Payer: Self-pay

## 2014-06-08 ENCOUNTER — Other Ambulatory Visit: Payer: Self-pay | Admitting: Internal Medicine

## 2014-06-08 NOTE — Telephone Encounter (Signed)
May refill x 1 He needs to come see me

## 2014-06-08 NOTE — Telephone Encounter (Signed)
Spoke with patient and he said he is doing well just running low on his prednisone.  I told him you had wanted to see him back in September but he said you didn't want to see him back was his understanding.  May I refill Sir?  Thank you for your time.

## 2014-06-08 NOTE — Telephone Encounter (Signed)
Patient has stepped out with a friend so I notified grandfather that refill sent in and to tell Mikiah to call us for an appointment.

## 2014-08-11 ENCOUNTER — Other Ambulatory Visit: Payer: Self-pay | Admitting: Internal Medicine

## 2014-08-11 NOTE — Telephone Encounter (Signed)
May I refill?

## 2014-08-11 NOTE — Telephone Encounter (Signed)
We need to find out if he has been taking prednisone or when he last did Ok to refill dicyclomine but I want to know what is going on before we refill prednisone and he needs to schedule f/u

## 2014-08-11 NOTE — Telephone Encounter (Signed)
Ok  Prednisone 10 mg qd # 30 2 RF

## 2014-08-11 NOTE — Telephone Encounter (Signed)
Patient has been taking his prednisone regularly and he's doing well, he made follow up appointment for February.

## 2014-08-11 NOTE — Telephone Encounter (Signed)
Patient's grandfather notified that rx's sent in and that f/u appt made for 09/27/2014 at 11:00am which is a different date/time than he and I had talked about earlier today, the Jan. 25th date was unavailable.  Grandfather said he would be bringing him so the new date/time would be fine.

## 2014-09-27 ENCOUNTER — Other Ambulatory Visit: Payer: Medicaid Other

## 2014-09-27 ENCOUNTER — Ambulatory Visit (INDEPENDENT_AMBULATORY_CARE_PROVIDER_SITE_OTHER): Payer: Medicaid Other | Admitting: Internal Medicine

## 2014-09-27 ENCOUNTER — Encounter: Payer: Self-pay | Admitting: Internal Medicine

## 2014-09-27 VITALS — BP 100/50 | HR 80 | Ht 67.75 in | Wt 138.5 lb

## 2014-09-27 DIAGNOSIS — K50012 Crohn's disease of small intestine with intestinal obstruction: Secondary | ICD-10-CM

## 2014-09-27 NOTE — Progress Notes (Signed)
Subjective:    Patient ID: Philip Joseph, male    DOB: 1990/10/08, 24 y.o.   MRN: [id number]  HPI   24 y/o presents to office for follow up of Crohn's illeitis.  States he is doing well on his current regimen of 32m Prednisone and 237mDicyclomine.  Also taking 500 mg Naproxen. Only complaint today is intermittent LUQ and left flank abdominal pain.  Has it 2-3 times a week and lasts up to 1-2 mins. Denies N/V/D, blood in stool, problems evacuating, problems urinating.  He was able to get medicaid.  Was seen by a dentist yesterday who has the patient on antibiotics for an upcoming procedure (removing back molars).   Allergies  Allergen Reactions  . Penicillins Rash   Outpatient Prescriptions Prior to Visit  Medication Sig Dispense Refill  . dicyclomine (BENTYL) 20 MG tablet take 1 tablet by mouth every 6 hours 60 tablet 2  . diphenhydrAMINE-zinc acetate (BENADRYL) cream Apply topically 3 (three) times daily as needed for itching.    . predniSONE (DELTASONE) 10 MG tablet Take 1 tablet (10 mg total) by mouth daily with breakfast. 30 tablet 2  . ondansetron (ZOFRAN) 8 MG tablet Take 8 mg by mouth every 8 (eight) hours as needed for nausea or vomiting.    . promethazine (PHENERGAN) 25 MG tablet Take 25 mg by mouth every 6 (six) hours as needed for nausea or vomiting.    . Marland KitchenYDROcodone-acetaminophen (NORCO/VICODIN) 5-325 MG per tablet Take 1 tab every 6 hours as needed for pain. Do not drive while taking this medication. (Patient not taking: Reported on 09/27/2014) 30 tablet 0   No facility-administered medications prior to visit.   Past Medical History  Diagnosis Date  . IBS (irritable bowel syndrome)   . ADHD (attention deficit hyperactivity disorder)   . Crohn's ileitis 12/19/2013  . Unspecified vitamin D deficiency 12/21/2013   Past Surgical History  Procedure Laterality Date  . Wrist fracture surgery    . Colonoscopy N/A 12/20/2013    Procedure: COLONOSCOPY;  Surgeon: CaGatha MayerMD;  Location: [facility];  Service: Endoscopy;  Laterality: N/A;   History   Social History  . Marital Status: Single    Spouse Name: N/A    Number of Children: N/A  . Years of Education: N/A   Social History Main Topics  . Smoking status: Current Every Day Smoker  . Smokeless tobacco: Never Used  . Alcohol Use: No  . Drug Use: No  . Sexual Activity: None   Other Topics Concern  . None   Social History Narrative   History reviewed. No pertinent family history.    Review of Systems  Constitutional: Negative for fever, chills, fatigue and unexpected weight change.  HENT: Negative for facial swelling, sore throat and trouble swallowing.   Respiratory: Negative for shortness of breath.   Cardiovascular: Negative for chest pain.  Gastrointestinal: Negative for nausea, vomiting, abdominal pain, diarrhea, constipation, blood in stool, abdominal distention and anal bleeding.  Genitourinary: Negative for dysuria and difficulty urinating.  Neurological: Negative for dizziness and light-headedness.       Objective:   Physical Exam  General: Well developed, well nourished, appears in no apparent distress. HEENT: Anicteic Sclera. No pharyngeal erythema or exudates  Neck: Supple, no JVD, no masses  Cardiovascular: RRR, S1 S2 auscultated, no rubs, murmurs or gallops.  Respiratory: Clear to auscultation bilaterally with equal chest rise  Abdomen: No tenderness to palpation, + BS, no masses.; No guarding or  peritoneal signs Extremities: warm dry without cyanosis clubbing.  Neuro: AAOx3, cranial nerves grossly intact.  Skin: Without rashes exudates or nodules.  Psych: Normal affect and demeanor      Assessment & Plan:   Crohn's ileitis, with intestinal obstruction  -Plan to getThiopurine methyltransferase(tpmt) rbc, Quantiferon tb gold assay -Patient stable on current regimen but will plan to wean patient off steroids and start biologic.  Will need  approval.    Tobacco Use -Counseling provided and pamphlet given.  Donald Siva Karolee Stamps 09/27/2014  I have personally seen the patient, reviewed and repeated key elements of the history and physical and participated in formation of the assessment and plan the student has documented.  Ideally biologic +/- immunomodulator Tx needed I think. Will need to sort out w/ compliance issues. Will obtain records from new PCP Dr. Byrd Hesselbach.

## 2014-09-27 NOTE — Patient Instructions (Addendum)
You have been given a separate informational sheet regarding your tobacco use, the importance of quitting and local resources to help you quit.  Your physician has requested that you go to the basement for the following lab work before leaving today: TPMT, and TB blood draw  Follow up with [place] in March.  We are going to obtain your records/labs for Dr Carlean Purl to review from your Doctor.   I appreciate the opportunity to care for you.

## 2014-09-27 NOTE — Assessment & Plan Note (Signed)
Now that he has Medicaid will rescreen w/ quantiferon and also check TPMT phenotype

## 2014-09-28 LAB — QUANTIFERON TB GOLD ASSAY (BLOOD)
Interferon Gamma Release Assay: NEGATIVE
MITOGEN VALUE: 6.11 [IU]/mL
Quantiferon Nil Value: 0.03 IU/mL
Quantiferon Tb Ag Minus Nil Value: 0.01 IU/mL
TB AG VALUE: 0.04 [IU]/mL

## 2014-10-04 ENCOUNTER — Telehealth: Payer: Self-pay

## 2014-10-04 NOTE — Telephone Encounter (Signed)
We have been trying to obtain patient's past lab work and office notes.  [facility] where Dr. Iona Beard Osei-Bonsu works sent the ROI back saying they have no patient by Philip Joseph's name or DOB.  Mortimer Fries the grandfather is going to go to an office on [address]. (formerly [place]) to see if they have the information we need.  He is unsure of the office name there.  He will be back in touch with [place], I gave him my name and number.

## 2014-10-05 ENCOUNTER — Telehealth: Payer: Self-pay | Admitting: Internal Medicine

## 2014-10-05 LAB — THIOPURINE METHYLTRANSFERASE (TPMT), RBC: Thiopurine Methyltransferase, RBC: 18 nmol/hr/mL RBC

## 2014-10-05 NOTE — Telephone Encounter (Signed)
Spoke with granddad Mortimer Fries and he gave me the name and number where Shamari has been seen., Dr. York Ram at the [facility] located at [address] , [place] [address], phone# [phone], fax # [phone].  I have re-faxed the ROI to them and we will await the records.

## 2014-11-08 ENCOUNTER — Ambulatory Visit: Payer: Medicaid Other | Admitting: Internal Medicine

## 2014-11-10 ENCOUNTER — Other Ambulatory Visit: Payer: Self-pay | Admitting: Internal Medicine

## 2014-11-10 NOTE — Telephone Encounter (Signed)
I spoke to [place] (grandpa) who reports Milano doing good.  They didn't remember their appt on 3/16 and apologized for missing it.  R/S'ed for 11/28/14.  Please advise regarding refill Sir, thank you.

## 2014-11-10 NOTE — Telephone Encounter (Signed)
What are we refilling Ok to refill prednisone at current dose if that is it

## 2014-11-23 ENCOUNTER — Emergency Department (HOSPITAL_COMMUNITY): Payer: Medicaid Other

## 2014-11-23 ENCOUNTER — Emergency Department (HOSPITAL_COMMUNITY)
Admission: EM | Admit: 2014-11-23 | Discharge: 2014-11-23 | Disposition: A | Discharge status: Home / Self Care | Payer: Medicaid Other | Attending: Emergency Medicine | Admitting: Emergency Medicine

## 2014-11-23 ENCOUNTER — Encounter (HOSPITAL_COMMUNITY): Payer: Self-pay | Admitting: Emergency Medicine

## 2014-11-23 DIAGNOSIS — Z79899 Other long term (current) drug therapy: Secondary | ICD-10-CM | POA: Insufficient documentation

## 2014-11-23 DIAGNOSIS — Z8659 Personal history of other mental and behavioral disorders: Secondary | ICD-10-CM | POA: Insufficient documentation

## 2014-11-23 DIAGNOSIS — X58XXXA Exposure to other specified factors, initial encounter: Secondary | ICD-10-CM | POA: Diagnosis not present

## 2014-11-23 DIAGNOSIS — Z792 Long term (current) use of antibiotics: Secondary | ICD-10-CM | POA: Insufficient documentation

## 2014-11-23 DIAGNOSIS — Z8639 Personal history of other endocrine, nutritional and metabolic disease: Secondary | ICD-10-CM | POA: Insufficient documentation

## 2014-11-23 DIAGNOSIS — Y9367 Activity, basketball: Secondary | ICD-10-CM | POA: Insufficient documentation

## 2014-11-23 DIAGNOSIS — Z7952 Long term (current) use of systemic steroids: Secondary | ICD-10-CM | POA: Insufficient documentation

## 2014-11-23 DIAGNOSIS — K589 Irritable bowel syndrome without diarrhea: Secondary | ICD-10-CM | POA: Insufficient documentation

## 2014-11-23 DIAGNOSIS — Y9289 Other specified places as the place of occurrence of the external cause: Secondary | ICD-10-CM | POA: Diagnosis not present

## 2014-11-23 DIAGNOSIS — Z720 Tobacco use: Secondary | ICD-10-CM | POA: Insufficient documentation

## 2014-11-23 DIAGNOSIS — S93402A Sprain of unspecified ligament of left ankle, initial encounter: Secondary | ICD-10-CM

## 2014-11-23 DIAGNOSIS — Y998 Other external cause status: Secondary | ICD-10-CM | POA: Diagnosis not present

## 2014-11-23 DIAGNOSIS — Z880 Allergy status to penicillin: Secondary | ICD-10-CM | POA: Diagnosis not present

## 2014-11-23 DIAGNOSIS — Z791 Long term (current) use of non-steroidal anti-inflammatories (NSAID): Secondary | ICD-10-CM | POA: Diagnosis not present

## 2014-11-23 DIAGNOSIS — S99912A Unspecified injury of left ankle, initial encounter: Secondary | ICD-10-CM | POA: Diagnosis present

## 2014-11-23 NOTE — ED Provider Notes (Signed)
CSN: [id number]     Arrival date & time 11/23/14  1027 History   First MD Initiated Contact with Patient 11/23/14 1035     Chief Complaint  Patient presents with  . Ankle Pain    swollen l/ankle one day after injury while playing basketball     (Consider location/radiation/quality/duration/timing/severity/associated sxs/prior Treatment) HPI Comments: Patient presents to the emergency department with chief complaint of left ankle pain. States that he twisted it while playing basketball yesterday. He reports feeling and hearing a pop. He reports pain with ambulation and with movement and palpation. He has not tried taking anything to alleviate his symptoms. The pain does not radiate.    The history is provided by the patient. No language interpreter was used.    Past Medical History  Diagnosis Date  . IBS (irritable bowel syndrome)   . ADHD (attention deficit hyperactivity disorder)   . Crohn's ileitis 12/19/2013  . Unspecified vitamin D deficiency 12/21/2013   Past Surgical History  Procedure Laterality Date  . Wrist fracture surgery    . Colonoscopy N/A 12/20/2013    Procedure: COLONOSCOPY;  Surgeon: [name], MD;  Location: [facility];  Service: Endoscopy;  Laterality: N/A;   History reviewed. No pertinent family history. History  Substance Use Topics  . Smoking status: Current Every Day Smoker  . Smokeless tobacco: Never Used  . Alcohol Use: No    Review of Systems  Constitutional: Negative for fever and chills.  Respiratory: Negative for shortness of breath.   Cardiovascular: Negative for chest pain.  Gastrointestinal: Negative for nausea, vomiting, diarrhea and constipation.  Genitourinary: Negative for dysuria.  Musculoskeletal: Positive for arthralgias.      Allergies  Penicillins  Home Medications   Prior to Admission medications   Medication Sig Start Date End Date Taking? Authorizing Provider  clindamycin (CLEOCIN) 300 MG capsule Take 1 capsule by  mouth daily. 09/26/14   Historical Provider, MD  dicyclomine (BENTYL) 20 MG tablet take 1 tablet by mouth every 6 hours 08/11/14   [name], MD  diphenhydrAMINE-zinc acetate (BENADRYL) cream Apply topically 3 (three) times daily as needed for itching.    Historical Provider, MD  Hydrocodone-Acetaminophen 5-300 MG TABS Take 1 capsule by mouth every 6 (six) hours as needed. 09/26/14   Historical Provider, MD  naproxen (NAPROSYN) 500 MG tablet Take 2 tablets by mouth 2 (two) times daily. 07/24/14   Historical Provider, MD  ondansetron (ZOFRAN) 8 MG tablet Take 8 mg by mouth every 8 (eight) hours as needed for nausea or vomiting.    Historical Provider, MD  predniSONE (DELTASONE) 10 MG tablet take 1 tablet by mouth once daily WITH BREAKFAST 11/10/14   [name], MD  promethazine (PHENERGAN) 25 MG tablet Take 25 mg by mouth every 6 (six) hours as needed for nausea or vomiting.    Historical Provider, MD   BP 111/57 mmHg  Pulse 73  Temp(Src) 98.3 F (36.8 C) (Oral)  Resp 16  Wt 165 lb (74.844 kg)  SpO2 100% Physical Exam  Constitutional: He is oriented to person, place, and time. He appears well-developed and well-nourished.  HENT:  Head: Normocephalic and atraumatic.  Eyes: Conjunctivae and EOM are normal.  Neck: Normal range of motion.  Cardiovascular: Normal rate and intact distal pulses.   Intact distal pulses  Pulmonary/Chest: Effort normal.  Abdominal: He exhibits no distension.  Musculoskeletal: Normal range of motion.  Moderate swelling of left ankle, TTP over lateral malleolus, ATFL, and CFL, ROM  and strength limited 2/2 pain  Neurological: He is alert and oriented to person, place, and time.  Sensation and strength intact  Skin: Skin is dry.  Psychiatric: He has a normal mood and affect. His behavior is normal. Judgment and thought content normal.  Nursing note and vitals reviewed.   ED Course  Procedures (including critical care time) Labs Review Labs Reviewed - No  data to display  Imaging Review Dg Ankle Complete Left  11/23/2014   CLINICAL DATA:  Twisted left ankle yesterday playing basketball, lateral ankle pain  EXAM: LEFT ANKLE COMPLETE - 3+ VIEW  COMPARISON:  None.  FINDINGS: Three views of left ankle submitted. No acute fracture or subluxation. Soft tissue swelling adjacent to lateral malleolus.  IMPRESSION: No acute fracture or subluxation. Soft tissue swelling adjacent to lateral malleolus.   Electronically Signed   By: Lahoma Crocker M.D.   On: 11/23/2014 11:59     EKG Interpretation None      MDM   Final diagnoses:  Ankle sprain, left, initial encounter    Patient with probable ankle sprain.  Will check plain film to rule out fx.    Plain films negative for acute fracture. Will discharge home with Aircast and crutches. Will recommend orthopedic follow-up. Patient has hydrocodone and naproxen at home. Recommend continuing this as needed. Recommend rice therapy for ankle. Patient understands and agrees the plan. He is stable and ready for discharge.    Montine Circle, PA-C 11/23/14 1205  Virgel Manifold, MD 11/24/14 [phone]

## 2014-11-23 NOTE — Discharge Instructions (Signed)

## 2014-11-23 NOTE — ED Notes (Signed)
Pt reports pain and swelling in l/ankle 1 day after twisting it while playing basketball. L/ankle swollen and discolored, no obvious deformity. Able to put some pressure on foot while ambulating

## 2014-11-28 ENCOUNTER — Other Ambulatory Visit (INDEPENDENT_AMBULATORY_CARE_PROVIDER_SITE_OTHER): Payer: Medicaid Other

## 2014-11-28 ENCOUNTER — Ambulatory Visit (INDEPENDENT_AMBULATORY_CARE_PROVIDER_SITE_OTHER): Payer: Medicaid Other | Admitting: Internal Medicine

## 2014-11-28 ENCOUNTER — Encounter: Payer: Self-pay | Admitting: Internal Medicine

## 2014-11-28 VITALS — BP 90/60 | HR 68 | Ht 67.75 in | Wt 131.0 lb

## 2014-11-28 DIAGNOSIS — R634 Abnormal weight loss: Secondary | ICD-10-CM

## 2014-11-28 DIAGNOSIS — F909 Attention-deficit hyperactivity disorder, unspecified type: Secondary | ICD-10-CM

## 2014-11-28 DIAGNOSIS — F17200 Nicotine dependence, unspecified, uncomplicated: Secondary | ICD-10-CM

## 2014-11-28 DIAGNOSIS — Z720 Tobacco use: Secondary | ICD-10-CM

## 2014-11-28 DIAGNOSIS — F71 Moderate intellectual disabilities: Secondary | ICD-10-CM

## 2014-11-28 DIAGNOSIS — K50019 Crohn's disease of small intestine with unspecified complications: Secondary | ICD-10-CM

## 2014-11-28 DIAGNOSIS — K5669 Other intestinal obstruction: Secondary | ICD-10-CM

## 2014-11-28 DIAGNOSIS — K56699 Other intestinal obstruction unspecified as to partial versus complete obstruction: Secondary | ICD-10-CM

## 2014-11-28 LAB — CBC WITH DIFFERENTIAL/PLATELET
Basophils Absolute: 0 10*3/uL (ref 0.0–0.1)
Basophils Relative: 0.3 % (ref 0.0–3.0)
EOS PCT: 0.6 % (ref 0.0–5.0)
Eosinophils Absolute: 0.1 10*3/uL (ref 0.0–0.7)
HEMATOCRIT: 45.7 % (ref 39.0–52.0)
Hemoglobin: 16 g/dL (ref 13.0–17.0)
LYMPHS ABS: 2.4 10*3/uL (ref 0.7–4.0)
LYMPHS PCT: 23.6 % (ref 12.0–46.0)
MCHC: 35 g/dL (ref 30.0–36.0)
MCV: 90.2 fl (ref 78.0–100.0)
MONOS PCT: 5.5 % (ref 3.0–12.0)
Monocytes Absolute: 0.6 10*3/uL (ref 0.1–1.0)
Neutro Abs: 7.1 10*3/uL (ref 1.4–7.7)
Neutrophils Relative %: 70 % (ref 43.0–77.0)
PLATELETS: 208 10*3/uL (ref 150.0–400.0)
RBC: 5.06 Mil/uL (ref 4.22–5.81)
RDW: 13 % (ref 11.5–15.5)
WBC: 10.2 10*3/uL (ref 4.0–10.5)

## 2014-11-28 LAB — TSH: TSH: 0.52 u[IU]/mL (ref 0.35–4.50)

## 2014-11-28 LAB — COMPREHENSIVE METABOLIC PANEL
ALK PHOS: 78 U/L (ref 39–117)
ALT: 11 U/L (ref 0–53)
AST: 22 U/L (ref 0–37)
Albumin: 4.4 g/dL (ref 3.5–5.2)
BILIRUBIN TOTAL: 0.6 mg/dL (ref 0.2–1.2)
BUN: 18 mg/dL (ref 6–23)
CO2: 29 meq/L (ref 19–32)
CREATININE: 0.91 mg/dL (ref 0.40–1.50)
Calcium: 9.7 mg/dL (ref 8.4–10.5)
Chloride: 103 mEq/L (ref 96–112)
GFR: 133.09 mL/min (ref >60.00)
GLUCOSE: 92 mg/dL (ref 70–99)
Potassium: 3.8 mEq/L (ref 3.5–5.1)
Sodium: 137 mEq/L (ref 135–145)
TOTAL PROTEIN: 7.5 g/dL (ref 6.0–8.3)

## 2014-11-28 LAB — C-REACTIVE PROTEIN: CRP: 1.5 mg/dL (ref 0.5–20.0)

## 2014-11-28 MED ORDER — POLYETHYLENE GLYCOL 3350 17 GM/SCOOP PO POWD: ORAL | Status: DC

## 2014-11-28 NOTE — Patient Instructions (Signed)
You have been scheduled for a colonoscopy. Please follow written instructions given to you at your visit today.  Please pick up your prep supplies at the pharmacy within the next 1-3 days. If you use inhalers (even only as needed), please bring them with you on the day of your procedure.  We have sent the following medications to your pharmacy for you to pick up at your convenience: [facility] has requested that you go to the basement for the lab work before leaving today.   Please chose a primary care Dr. From your list .  Set up an appointment to talk about re-starting medicine for your ADHD.   I appreciate the opportunity to care for you.

## 2014-11-28 NOTE — Progress Notes (Signed)
   Subjective:    Patient ID: Philip Joseph, male    DOB: 1990-10-05, 24 y.o.   MRN: [id number] Cc: Crohn's and weight loss HPI The patient is here for f/u of Crhn's ileitis. He is on 10 mg prednisone daily. His grandfather spoke to me separately with Philip Joseph's permssion and rported that he is concerned that Philip Joseph will not take ADHD meds, cannot hold a job, is smoking cigarettes and possibly other substances like marijuana. There have been some problems where police were called and they tried to commit Philip Joseph but it did not pan out. Philip Joseph admits to smoking cigarettes and drinking alcohol. He is living with a girlfriend until he can get his own place.   Philip Joseph says he ius stressed by current girlfriend and her jealousy of his prior girlfriend. He is having intercourse and using condoms. "I don't want a kid"  Hie grandfather is concerned about weight loss. Philip Joseph denies diarrhea, eating ok. No abdominal pain. When he was here last he did quantiferon TB test (neg) Hep B S ag neg and thiopurine methyltransferase - NL in anticipation of starting other Tx  Medications, allergies, past medical history, past surgical history, family history and social history are reviewed and updated in the EMR.   Review of Systems As above    Objective:   Physical Exam @BP  90/60 mmHg  Pulse 68  Ht 5' 7.75" (1.721 m)  Wt 131 lb (59.421 kg)  BMI 20.06 kg/m2@  General:  NAD Eyes:   anicteric Lungs:  clear Heart:: S1S2 no rubs, murmurs or gallops Abdomen:  soft and nontender, BS+, scaphoid Ext:   no edema, cyanosis or clubbing    Data Reviewed:  prior notes, labs, colonoscopy, CT 2015 Wt Readings from Last 3 Encounters:  11/28/14 131 lb (59.421 kg)  11/23/14 165 lb (74.844 kg)  09/27/14 138 lb 8 oz (62.823 kg)       Assessment & Plan:   Moderate intellectual disability Reviewed with grandfather today (raised by grandparents) - did not take regular school classes and has lower  IQ. This presents challenges re: insight into illness and need for Tx.   Attention deficit hyperactivity disorder (ADHD) Not on Tx Has helped in past Discussed this and have encouraged him to return to PCP to see about restarting   Crohn's ileitis Continue prednisone at current dose and reassess at colonoscopy. The risks and benefits as well as alternatives of endoscopic procedure(s) have been discussed and reviewed. All questions answered. The patient agrees to proceed. If colonoscopy does not reveal stricture or active disease then try to transition to other Tx like biologic vs. Immunomodulator. If scar/stricture present - consider surgical resection. I have concerns about compliance with biologic Tx but also not best to continue prednisone long-term. ASA compund not liukely to be adequte Tx here.   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

## 2014-11-29 ENCOUNTER — Encounter: Payer: Self-pay | Admitting: Internal Medicine

## 2014-11-29 DIAGNOSIS — F71 Moderate intellectual disabilities: Secondary | ICD-10-CM

## 2014-11-29 DIAGNOSIS — F17200 Nicotine dependence, unspecified, uncomplicated: Secondary | ICD-10-CM | POA: Insufficient documentation

## 2014-11-29 HISTORY — DX: Moderate intellectual disabilities: F71

## 2014-11-29 NOTE — Assessment & Plan Note (Signed)
Continue prednisone at current dose and reassess at colonoscopy. The risks and benefits as well as alternatives of endoscopic procedure(s) have been discussed and reviewed. All questions answered. The patient agrees to proceed. If colonoscopy does not reveal stricture or active disease then try to transition to other Tx like biologic vs. Immunomodulator. If scar/stricture present - consider surgical resection. I have concerns about compliance with biologic Tx but also not best to continue prednisone long-term. ASA compund not liukely to be adequte Tx here.

## 2014-11-29 NOTE — Progress Notes (Signed)
Quick Note:  Labs all ok Will see him at colonoscopy ______

## 2014-11-29 NOTE — Assessment & Plan Note (Signed)
Reassess w/ colonoscopy See Crohn's A?P

## 2014-11-29 NOTE — Assessment & Plan Note (Signed)
Reviewed with grandfather today (raised by grandparents) - did not take regular school classes and has lower IQ. This presents challenges re: insight into illness and need for Tx.

## 2014-11-29 NOTE — Assessment & Plan Note (Signed)
Not on Tx Has helped in past Discussed this and have encouraged him to return to PCP to see about restarting

## 2014-11-29 NOTE — Assessment & Plan Note (Signed)
We reviewed negative affects re: Crohn's and overall health and advised quitting

## 2014-12-25 ENCOUNTER — Encounter: Payer: Self-pay | Admitting: Internal Medicine

## 2014-12-25 ENCOUNTER — Ambulatory Visit (AMBULATORY_SURGERY_CENTER): Payer: Medicaid Other | Admitting: Internal Medicine

## 2014-12-25 VITALS — BP 100/57 | HR 65 | Temp 97.3°F | Resp 57 | Ht 67.75 in | Wt 131.0 lb

## 2014-12-25 DIAGNOSIS — K50019 Crohn's disease of small intestine with unspecified complications: Secondary | ICD-10-CM | POA: Diagnosis not present

## 2014-12-25 DIAGNOSIS — K50119 Crohn's disease of large intestine with unspecified complications: Secondary | ICD-10-CM | POA: Diagnosis not present

## 2014-12-25 MED ORDER — SODIUM CHLORIDE 0.9 % IV SOLN: 500.0000 mL | INTRAVENOUS | Status: DC

## 2014-12-25 NOTE — Patient Instructions (Addendum)
There were some changes of Crohn's in the ileum - the end of the small intestine. It looks somewhat scarred. I took biopsies. Everything else looked ok.  Once the biopsies are back we will contact you with results.  I think you will need a CT scan also.  I appreciate the opportunity to care for you. [name], MD, FACG  YOU HAD AN ENDOSCOPIC PROCEDURE TODAY AT [facility]:   Refer to the procedure report that was given to you for any specific questions about what was found during the examination.  If the procedure report does not answer your questions, please call your gastroenterologist to clarify.  If you requested that your care partner not be given the details of your procedure findings, then the procedure report has been included in a sealed envelope for you to review at your convenience later.  YOU SHOULD EXPECT: Some feelings of bloating in the abdomen. Passage of more gas than usual.  Walking can help get rid of the air that was put into your GI tract during the procedure and reduce the bloating. If you had a lower endoscopy (such as a colonoscopy or flexible sigmoidoscopy) you may notice spotting of blood in your stool or on the toilet paper. If you underwent a bowel prep for your procedure, you may not have a normal bowel movement for a few days.  Please Note:  You might notice some irritation and congestion in your nose or some drainage.  This is from the oxygen used during your procedure.  There is no need for concern and it should clear up in a day or so.  SYMPTOMS TO REPORT IMMEDIATELY:   Following lower endoscopy (colonoscopy or flexible sigmoidoscopy):  Excessive amounts of blood in the stool  Significant tenderness or worsening of abdominal pains  Swelling of the abdomen that is new, acute  Fever of 100F or higher  For urgent or emergent issues, a gastroenterologist can be reached at any hour by calling [phone].   DIET: Your first  meal following the procedure should be a small meal and then it is ok to progress to your normal diet. Heavy or fried foods are harder to digest and may make you feel nauseous or bloated.  Likewise, meals heavy in dairy and vegetables can increase bloating.  Drink plenty of fluids but you should avoid alcoholic beverages for 24 hours.  ACTIVITY:  You should plan to take it easy for the rest of today and you should NOT DRIVE or use heavy machinery until tomorrow (because of the sedation medicines used during the test).    FOLLOW UP: Our staff will call the number listed on your records the next business day following your procedure to check on you and address any questions or concerns that you may have regarding the information given to you following your procedure. If we do not reach you, we will leave a message.  However, if you are feeling well and you are not experiencing any problems, there is no need to return our call.  We will assume that you have returned to your regular daily activities without incident.  If any biopsies were taken you will be contacted by phone or by letter within the next 1-3 weeks.  Please call us at [phone] if you have not heard about the biopsies in 3 weeks.    SIGNATURES/CONFIDENTIALITY: You and/or your care partner have signed paperwork which will be entered into your electronic medical record.  These signatures attest to the fact that that the information above on your After Visit Summary has been reviewed and is understood.  Full responsibility of the confidentiality of this discharge information lies with you and/or your care-partner.

## 2014-12-25 NOTE — Progress Notes (Signed)
Called to room to assist during endoscopic procedure.  Patient ID and intended procedure confirmed with present staff. Received instructions for my participation in the procedure from the performing physician.  

## 2014-12-25 NOTE — Progress Notes (Signed)
Stable to RR 

## 2014-12-25 NOTE — Op Note (Signed)
[place]  [address] [place], [address]   COLONOSCOPY PROCEDURE REPORT  PATIENT: Philip Joseph, Philip Joseph  MR#: [id number] BIRTHDATE: [birth year] , 23  yrs. old GENDER: male ENDOSCOPIST: [name], MD, [facility] PROCEDURE DATE:  12/25/2014 PROCEDURE:   Colonoscopy with biopsy First Screening Colonoscopy - Avg.  risk and is 50 yrs.  old or older - No.  Prior Negative Screening - Now for repeat screening. N/A  History of Adenoma - Now for follow-up colonoscopy & has been > or = to 3 yrs.  N/A ASA CLASS:   Class II INDICATIONS:Inflammatory bowel disease of the intestine if more precise diagnosis or determination of the extent / severity of activity of disease will influence immediate / future management and Patient is not applicable for Colorectal Neoplasm Risk Assessment for this procedure. MEDICATIONS: Propofol 300 mg IV, Monitored anesthesia care, and Lidocaine 40 mg IV  DESCRIPTION OF PROCEDURE:   After the risks benefits and alternatives of the procedure were thoroughly explained, informed consent was obtained.  The digital rectal exam revealed no abnormalities of the rectum.   The LB [id number]  endoscope was introduced through the anus and advanced to the terminal ileum which was intubated for a short distance. No adverse events experienced.   Limited by a stricture.   The quality of the prep was good.  The instrument was then slowly withdrawn as the colon was fully examined.      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 there were 2 polypoid structures visible.  The stricture was not traversable. Scope would open terminal ileum but would not enter. Multiple biopsies were performed using cold forceps.   The examination was otherwise normal.  Retroflexed views revealed no abnormalities. The time to cecum = 2.2 Withdrawal time = 6.7   The scope was withdrawn and the procedure completed. COMPLICATIONS: There were no  immediate complications.  ENDOSCOPIC IMPRESSION: 1.   There was a stricture in the terminal ileum; multiple biopsies were performed using cold forceps - looks like a fibrotic Crohn's stricture 2.   The examination was otherwise normal good prep.  RECOMMENDATIONS: 1.  Await biopsy results 2.  Office will call with the results. 3.  anticipate he will need a CT-enterography to better understand stricture vs. active inflammation to help determine medical  vs. surgical Tx options.  eSigned:  [name], MD, [facility] 12/25/2014 4:20 PM   cc: The Patient and York Ram, MD

## 2014-12-26 ENCOUNTER — Telehealth: Payer: Self-pay

## 2014-12-26 NOTE — Telephone Encounter (Signed)
  Follow up Call-  Call back number 12/25/2014  Post procedure Call Back phone  # [phone]  Permission to leave phone message Yes     Patient questions:  Do you have a fever, pain , or abdominal swelling? No. Pain Score  0 *  Have you tolerated food without any problems? Yes.    Have you been able to return to your normal activities? Yes.    Do you have any questions about your discharge instructions: Diet   No. Medications  No. Follow up visit  No.  Do you have questions or concerns about your Care? No.  Actions: * If pain score is 4 or above: No action needed, pain <4.  I spoke with the pt's grandfather, Pier Bosher.  He states, "I am Merril's over seeer.". maw

## 2015-01-02 NOTE — Progress Notes (Signed)
Quick Note:  Biopsies show Crohn's I need him to do a CT enterography re: Crohn's ileitis - ? Fibrotic vs. Inflammatory stricture  ______

## 2015-01-03 ENCOUNTER — Other Ambulatory Visit: Payer: Self-pay

## 2015-01-03 DIAGNOSIS — K50019 Crohn's disease of small intestine with unspecified complications: Secondary | ICD-10-CM

## 2015-01-10 ENCOUNTER — Ambulatory Visit (INDEPENDENT_AMBULATORY_CARE_PROVIDER_SITE_OTHER)
Admission: RE | Admit: 2015-01-10 | Discharge: 2015-01-10 | Disposition: A | Discharge status: Home / Self Care | Payer: Medicaid Other | Source: Ambulatory Visit | Attending: Internal Medicine | Admitting: Internal Medicine

## 2015-01-10 DIAGNOSIS — K50019 Crohn's disease of small intestine with unspecified complications: Secondary | ICD-10-CM | POA: Diagnosis not present

## 2015-01-10 MED ORDER — IOHEXOL 300 MG/ML  SOLN
125.0000 mL | Freq: Once | INTRAMUSCULAR | Status: AC | PRN
  Administered 2015-01-10: 100 mL via INTRAVENOUS

## 2015-01-16 NOTE — Progress Notes (Signed)
Quick Note:  CT shows the Crohn's and narrow area in he intestine Please explain to patient and grandfather I need him to come back to see me to review the findings and decide next steps ______

## 2015-01-17 ENCOUNTER — Telehealth: Payer: Self-pay | Admitting: Internal Medicine

## 2015-01-17 NOTE — Telephone Encounter (Signed)
I spoke with the patient's grandfather and reviewed the results of the CT and the follow up appt scheduled for 02/07/15

## 2015-02-07 ENCOUNTER — Ambulatory Visit (INDEPENDENT_AMBULATORY_CARE_PROVIDER_SITE_OTHER)
Admission: RE | Admit: 2015-02-07 | Discharge: 2015-02-07 | Disposition: A | Discharge status: Home / Self Care | Payer: Medicaid Other | Source: Ambulatory Visit | Attending: Internal Medicine | Admitting: Internal Medicine

## 2015-02-07 ENCOUNTER — Other Ambulatory Visit (INDEPENDENT_AMBULATORY_CARE_PROVIDER_SITE_OTHER): Payer: Medicaid Other

## 2015-02-07 ENCOUNTER — Encounter (INDEPENDENT_AMBULATORY_CARE_PROVIDER_SITE_OTHER): Payer: Self-pay

## 2015-02-07 ENCOUNTER — Ambulatory Visit (INDEPENDENT_AMBULATORY_CARE_PROVIDER_SITE_OTHER): Payer: Medicaid Other | Admitting: Internal Medicine

## 2015-02-07 ENCOUNTER — Encounter: Payer: Self-pay | Admitting: Internal Medicine

## 2015-02-07 VITALS — BP 106/70 | HR 68 | Ht 67.75 in | Wt 138.2 lb

## 2015-02-07 DIAGNOSIS — E559 Vitamin D deficiency, unspecified: Secondary | ICD-10-CM

## 2015-02-07 DIAGNOSIS — R05 Cough: Secondary | ICD-10-CM

## 2015-02-07 DIAGNOSIS — K5669 Other intestinal obstruction: Secondary | ICD-10-CM | POA: Diagnosis not present

## 2015-02-07 DIAGNOSIS — K56699 Other intestinal obstruction unspecified as to partial versus complete obstruction: Secondary | ICD-10-CM

## 2015-02-07 DIAGNOSIS — R059 Cough, unspecified: Secondary | ICD-10-CM

## 2015-02-07 DIAGNOSIS — K50012 Crohn's disease of small intestine with intestinal obstruction: Secondary | ICD-10-CM

## 2015-02-07 LAB — VITAMIN D 25 HYDROXY (VIT D DEFICIENCY, FRACTURES): VITD: 12.76 ng/mL — ABNORMAL LOW (ref 30.00–100.00)

## 2015-02-07 NOTE — Assessment & Plan Note (Addendum)
Start remicade and continue low-dose prednisone Side effects, risks, benefits of Remicade reviewed with patient and grandfather Handout provided Check HBSag

## 2015-02-07 NOTE — Assessment & Plan Note (Signed)
Recheck level

## 2015-02-07 NOTE — Progress Notes (Signed)
   Subjective:    Patient ID: Philip Joseph, male    DOB: 29-Jun-1991, 24 y.o.   MRN: [id number] Cc: follow-up Crohn's HPI Grandfather with him Crohn's ok - no diarrhea, sig abd pain or bleeding "summer cold" w/ dry cough rhinorrhea Still smoking  Colonoscopy and CT-enterography have shown distal ileal inflammatory stricture.  On 10 mg prednisone at this time. Takes 1 dicyclomine daily  Medications, allergies, past medical history, past surgical history, family history and social history are reviewed and updated in the EMR.   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

## 2015-02-07 NOTE — Patient Instructions (Addendum)
  Your physician has requested that you go to the basement for the lab work before leaving today.  Please go to the basement and get a chest x-ray before leaving today.  Today you have been given a handout on Crohn's to read.  Also Dr. Carlean Purl printed information about remicade for you to read.   Barb Merino, RN, CGRN will be in touch about starting the remicade.  I appreciate the opportunity to care for you. Silvano Rusk, M.D., [facility] [place]

## 2015-02-07 NOTE — Assessment & Plan Note (Signed)
Remicade

## 2015-02-08 ENCOUNTER — Other Ambulatory Visit: Payer: Self-pay

## 2015-02-08 LAB — HEPATITIS B SURFACE ANTIGEN: HEP B S AG: NEGATIVE

## 2015-02-08 MED ORDER — ERGOCALCIFEROL 1.25 MG (50000 UT) PO CAPS: 50000.0000 [IU] | ORAL_CAPSULE | ORAL | Status: DC

## 2015-02-08 NOTE — Progress Notes (Signed)
Quick Note:  Hep B neg CXR was ok Needs 50 K vit D weekly # 12 no refills ______

## 2015-02-14 ENCOUNTER — Other Ambulatory Visit: Payer: Self-pay

## 2015-02-14 ENCOUNTER — Telehealth: Payer: Self-pay

## 2015-02-14 DIAGNOSIS — K50119 Crohn's disease of large intestine with unspecified complications: Secondary | ICD-10-CM

## 2015-02-14 NOTE — Telephone Encounter (Signed)
Patient is scheduled for Remicade infusions at [facility] - according to Rob at [place] does not require prior authorization Remicade week 0 - 03/07/15 1:00                  week 2  - 04/21/15 1:00                  week 6-  04/18/15   1:00  Mortimer Fries patient's grandfather notified of the appt dates and times He is notified to arrive at 12:45 in admitting at [facility] on 03/07/15.  He is also notified that the 4th infusion will be scheduled with them on 04/18/15 for October.  He will call back for any additional questions or concerns

## 2015-03-07 ENCOUNTER — Encounter (HOSPITAL_COMMUNITY): Admission: RE | Admit: 2015-03-07 | Payer: Medicaid Other | Source: Ambulatory Visit

## 2015-03-07 ENCOUNTER — Other Ambulatory Visit: Payer: Self-pay | Admitting: Internal Medicine

## 2015-03-07 NOTE — Telephone Encounter (Signed)
Short stay called and patient did not show up today for first Remicade infusion.  I called and spoke with Philip Joseph his grandfather, he reported that Philip Joseph has been "resistant" to him helping him and Philip Joseph's father was supposed to take him today.  He will see what happened.  He verbalized understanding to have him go to the 03/21/15 1:00 appt and that will be his first infusion.

## 2015-03-21 ENCOUNTER — Encounter (HOSPITAL_COMMUNITY): Payer: Self-pay

## 2015-03-21 ENCOUNTER — Encounter (HOSPITAL_COMMUNITY)
Admission: RE | Admit: 2015-03-21 | Discharge: 2015-03-21 | Disposition: A | Discharge status: Home / Self Care | Payer: Medicaid Other | Source: Ambulatory Visit | Attending: Internal Medicine | Admitting: Internal Medicine

## 2015-03-21 VITALS — BP 106/53 | HR 50 | Temp 98.0°F | Resp 17 | Ht 68.0 in | Wt 133.0 lb

## 2015-03-21 DIAGNOSIS — K50119 Crohn's disease of large intestine with unspecified complications: Secondary | ICD-10-CM

## 2015-03-21 MED ORDER — SODIUM CHLORIDE 0.9 % IV SOLN
5.0000 mg/kg | Freq: Once | INTRAVENOUS | Status: AC
  Administered 2015-03-21: 300 mg via INTRAVENOUS
  Filled 2015-03-21: qty 30

## 2015-03-21 MED ORDER — DIPHENHYDRAMINE HCL 25 MG PO CAPS
50.0000 mg | ORAL_CAPSULE | Freq: Every day | ORAL | Status: DC
  Administered 2015-03-21: 50 mg via ORAL
  Filled 2015-03-21 (×2): qty 2

## 2015-03-21 MED ORDER — SODIUM CHLORIDE 0.9 % IV SOLN
INTRAVENOUS | Status: DC
  Administered 2015-03-21: 13:00:00 via INTRAVENOUS

## 2015-03-21 MED ORDER — ACETAMINOPHEN 325 MG PO TABS
650.0000 mg | ORAL_TABLET | Freq: Every day | ORAL | Status: DC
  Administered 2015-03-21: 650 mg via ORAL
  Filled 2015-03-21: qty 2

## 2015-03-21 NOTE — Progress Notes (Signed)
Pt. Tolerated 1st infusion of Remicade, pt. To follow-up with Dr. Carlean Purl  With any  Problems, pt. And pt's girlfriend verbalized understanding.

## 2015-03-21 NOTE — Discharge Instructions (Signed)
Infliximab injection What is this medicine? INFLIXIMAB (in [place] i mab) is used to treat Crohn's disease and ulcerative colitis. It is also used to treat ankylosing spondylitis, psoriasis, and some forms of arthritis. This medicine may be used for other purposes; ask your health care provider or pharmacist if you have questions. COMMON BRAND NAME(S): Remicade What should I tell my health care provider before I take this medicine? They need to know if you have any of these conditions: -diabetes -exposure to tuberculosis -heart failure -hepatitis or liver disease -immune system problems -infection -lung or breathing disease, like COPD -multiple sclerosis -current or past resident of [place] or [place] -seizure disorder -an unusual or allergic reaction to infliximab, mouse proteins, other medicines, foods, dyes, or preservatives -pregnant or trying to get pregnant -breast-feeding How should I use this medicine? This medicine is for injection into a vein. It is usually given by a health care professional in a hospital or clinic setting. A special MedGuide will be given to you by the pharmacist with each prescription and refill. Be sure to read this information carefully each time. Talk to your pediatrician regarding the use of this medicine in children. Special care may be needed. Overdosage: If you think you have taken too much of this medicine contact a poison control center or emergency room at once. NOTE: This medicine is only for you. Do not share this medicine with others. What if I miss a dose? It is important not to miss your dose. Call your doctor or health care professional if you are unable to keep an appointment. What may interact with this medicine? Do not take this medicine with any of the following medications: -anakinra -rilonacept This medicine may also interact with the following medications: -vaccines This list may not describe all possible interactions.  Give your health care provider a list of all the medicines, herbs, non-prescription drugs, or dietary supplements you use. Also tell them if you smoke, drink alcohol, or use illegal drugs. Some items may interact with your medicine. What should I watch for while using this medicine? Visit your doctor or health care professional for regular checks on your progress. If you get a cold or other infection while receiving this medicine, call your doctor or health care professional. Do not treat yourself. This medicine may decrease your body's ability to fight infections. Before beginning therapy, your doctor may do a test to see if you have been exposed to tuberculosis. This medicine may make the symptoms of heart failure worse in some patients. If you notice symptoms such as increased shortness of breath or swelling of the ankles or legs, contact your health care provider right away. If you are going to have surgery or dental work, tell your health care professional or dentist that you have received this medicine. If you take this medicine for plaque psoriasis, stay out of the sun. If you cannot avoid being in the sun, wear protective clothing and use sunscreen. Do not use sun lamps or tanning beds/booths. What side effects may I notice from receiving this medicine? Side effects that you should report to your doctor or health care professional as soon as possible: -allergic reactions like skin rash, itching or hives, swelling of the face, lips, or tongue -chest pain -fever or chills, usually related to the infusion -muscle or joint pain -red, scaly patches or raised bumps on the skin -signs of infection - fever or chills, cough, sore throat, pain or difficulty passing urine -swollen lymph nodes  in the neck, underarm, or groin areas -unexplained weight loss -unusual bleeding or bruising -unusually weak or tired -yellowing of the eyes or skin Side effects that usually do not require medical attention  (report to your doctor or health care professional if they continue or are bothersome): -headache -heartburn or stomach pain -nausea, vomiting This list may not describe all possible side effects. Call your doctor for medical advice about side effects. You may report side effects to FDA at 1-800-FDA-1088. Where should I keep my medicine? This drug is given in a hospital or clinic and will not be stored at home. NOTE: This sheet is a summary. It may not cover all possible information. If you have questions about this medicine, talk to your doctor, pharmacist, or health care provider.  2015, Elsevier/Gold Standard. (2008-03-29 10:26:02)

## 2015-04-05 ENCOUNTER — Encounter (HOSPITAL_COMMUNITY): Payer: Self-pay

## 2015-04-05 ENCOUNTER — Encounter (HOSPITAL_COMMUNITY)
Admission: RE | Admit: 2015-04-05 | Discharge: 2015-04-05 | Disposition: A | Discharge status: Home / Self Care | Payer: Medicaid Other | Source: Ambulatory Visit | Attending: Internal Medicine | Admitting: Internal Medicine

## 2015-04-05 VITALS — BP 103/43 | HR 57 | Temp 97.7°F | Resp 16 | Ht 68.0 in | Wt 132.8 lb

## 2015-04-05 DIAGNOSIS — K50119 Crohn's disease of large intestine with unspecified complications: Secondary | ICD-10-CM | POA: Diagnosis present

## 2015-04-05 MED ORDER — SODIUM CHLORIDE 0.9 % IV SOLN
INTRAVENOUS | Status: DC
  Administered 2015-04-05: 09:00:00 via INTRAVENOUS

## 2015-04-05 MED ORDER — DIPHENHYDRAMINE HCL 25 MG PO CAPS
50.0000 mg | ORAL_CAPSULE | Freq: Every day | ORAL | Status: DC
  Administered 2015-04-05: 50 mg via ORAL
  Filled 2015-04-05: qty 2

## 2015-04-05 MED ORDER — SODIUM CHLORIDE 0.9 % IV SOLN
5.0000 mg/kg | Freq: Once | INTRAVENOUS | Status: AC
  Administered 2015-04-05: 300 mg via INTRAVENOUS
  Filled 2015-04-05: qty 30

## 2015-04-05 MED ORDER — ACETAMINOPHEN 325 MG PO TABS
650.0000 mg | ORAL_TABLET | Freq: Every day | ORAL | Status: DC
  Administered 2015-04-05: 650 mg via ORAL
  Filled 2015-04-05: qty 2

## 2015-04-05 NOTE — Discharge Instructions (Signed)
Infliximab injection What is this medicine? INFLIXIMAB (in [place] i mab) is used to treat Crohn's disease and ulcerative colitis. It is also used to treat ankylosing spondylitis, psoriasis, and some forms of arthritis. This medicine may be used for other purposes; ask your health care provider or pharmacist if you have questions. COMMON BRAND NAME(S): Remicade What should I tell my health care provider before I take this medicine? They need to know if you have any of these conditions: -diabetes -exposure to tuberculosis -heart failure -hepatitis or liver disease -immune system problems -infection -lung or breathing disease, like COPD -multiple sclerosis -current or past resident of [place] or [place] -seizure disorder -an unusual or allergic reaction to infliximab, mouse proteins, other medicines, foods, dyes, or preservatives -pregnant or trying to get pregnant -breast-feeding How should I use this medicine? This medicine is for injection into a vein. It is usually given by a health care professional in a hospital or clinic setting. A special MedGuide will be given to you by the pharmacist with each prescription and refill. Be sure to read this information carefully each time. Talk to your pediatrician regarding the use of this medicine in children. Special care may be needed. Overdosage: If you think you have taken too much of this medicine contact a poison control center or emergency room at once. NOTE: This medicine is only for you. Do not share this medicine with others. What if I miss a dose? It is important not to miss your dose. Call your doctor or health care professional if you are unable to keep an appointment. What may interact with this medicine? Do not take this medicine with any of the following medications: -anakinra -rilonacept This medicine may also interact with the following medications: -vaccines This list may not describe all possible interactions.  Give your health care provider a list of all the medicines, herbs, non-prescription drugs, or dietary supplements you use. Also tell them if you smoke, drink alcohol, or use illegal drugs. Some items may interact with your medicine. What should I watch for while using this medicine? Visit your doctor or health care professional for regular checks on your progress. If you get a cold or other infection while receiving this medicine, call your doctor or health care professional. Do not treat yourself. This medicine may decrease your body's ability to fight infections. Before beginning therapy, your doctor may do a test to see if you have been exposed to tuberculosis. This medicine may make the symptoms of heart failure worse in some patients. If you notice symptoms such as increased shortness of breath or swelling of the ankles or legs, contact your health care provider right away. If you are going to have surgery or dental work, tell your health care professional or dentist that you have received this medicine. If you take this medicine for plaque psoriasis, stay out of the sun. If you cannot avoid being in the sun, wear protective clothing and use sunscreen. Do not use sun lamps or tanning beds/booths. What side effects may I notice from receiving this medicine? Side effects that you should report to your doctor or health care professional as soon as possible: -allergic reactions like skin rash, itching or hives, swelling of the face, lips, or tongue -chest pain -fever or chills, usually related to the infusion -muscle or joint pain -red, scaly patches or raised bumps on the skin -signs of infection - fever or chills, cough, sore throat, pain or difficulty passing urine -swollen lymph nodes  in the neck, underarm, or groin areas -unexplained weight loss -unusual bleeding or bruising -unusually weak or tired -yellowing of the eyes or skin Side effects that usually do not require medical attention  (report to your doctor or health care professional if they continue or are bothersome): -headache -heartburn or stomach pain -nausea, vomiting This list may not describe all possible side effects. Call your doctor for medical advice about side effects. You may report side effects to FDA at 1-800-FDA-1088. Where should I keep my medicine? This drug is given in a hospital or clinic and will not be stored at home. NOTE: This sheet is a summary. It may not cover all possible information. If you have questions about this medicine, talk to your doctor, pharmacist, or health care provider.  2015, Elsevier/Gold Standard. (2008-03-29 10:26:02)

## 2015-04-18 ENCOUNTER — Encounter (HOSPITAL_COMMUNITY): Payer: Medicaid Other

## 2015-05-07 ENCOUNTER — Encounter (HOSPITAL_COMMUNITY)
Admission: RE | Admit: 2015-05-07 | Discharge: 2015-05-07 | Disposition: A | Discharge status: Home / Self Care | Payer: Medicaid Other | Source: Ambulatory Visit | Attending: Internal Medicine | Admitting: Internal Medicine

## 2015-05-07 VITALS — BP 111/70 | HR 50 | Temp 98.3°F | Resp 16 | Ht 68.0 in | Wt 133.5 lb

## 2015-05-07 DIAGNOSIS — K50119 Crohn's disease of large intestine with unspecified complications: Secondary | ICD-10-CM

## 2015-05-07 MED ORDER — SODIUM CHLORIDE 0.9 % IV SOLN
INTRAVENOUS | Status: DC
  Administered 2015-05-07: 250 mL via INTRAVENOUS

## 2015-05-07 MED ORDER — SODIUM CHLORIDE 0.9 % IV SOLN
5.0000 mg/kg | Freq: Once | INTRAVENOUS | Status: AC
  Administered 2015-05-07: 300 mg via INTRAVENOUS
  Filled 2015-05-07: qty 30

## 2015-05-07 MED ORDER — DIPHENHYDRAMINE HCL 25 MG PO CAPS
50.0000 mg | ORAL_CAPSULE | Freq: Every day | ORAL | Status: DC
  Administered 2015-05-07: 50 mg via ORAL
  Filled 2015-05-07: qty 2

## 2015-05-07 MED ORDER — ACETAMINOPHEN 325 MG PO TABS
650.0000 mg | ORAL_TABLET | Freq: Every day | ORAL | Status: DC
  Administered 2015-05-07: 650 mg via ORAL
  Filled 2015-05-07: qty 2

## 2015-05-07 NOTE — Progress Notes (Signed)
Today was week 6 of induction of REMICADE. Infusion today was uneventful

## 2015-05-29 ENCOUNTER — Encounter (HOSPITAL_COMMUNITY): Payer: Medicaid Other

## 2015-06-27 ENCOUNTER — Other Ambulatory Visit: Payer: Self-pay

## 2015-06-27 DIAGNOSIS — K50119 Crohn's disease of large intestine with unspecified complications: Secondary | ICD-10-CM

## 2015-07-02 ENCOUNTER — Encounter (INDEPENDENT_AMBULATORY_CARE_PROVIDER_SITE_OTHER): Payer: Self-pay

## 2015-07-02 ENCOUNTER — Encounter (HOSPITAL_COMMUNITY)
Admission: RE | Admit: 2015-07-02 | Discharge: 2015-07-02 | Disposition: A | Discharge status: Home / Self Care | Payer: Medicaid Other | Source: Ambulatory Visit | Attending: Internal Medicine | Admitting: Internal Medicine

## 2015-07-02 ENCOUNTER — Encounter (HOSPITAL_COMMUNITY): Payer: Self-pay

## 2015-07-02 VITALS — BP 99/60 | HR 48 | Temp 98.2°F | Resp 16 | Ht 68.0 in | Wt 134.6 lb

## 2015-07-02 DIAGNOSIS — K50119 Crohn's disease of large intestine with unspecified complications: Secondary | ICD-10-CM

## 2015-07-02 MED ORDER — DIPHENHYDRAMINE HCL 50 MG PO CAPS
50.0000 mg | ORAL_CAPSULE | Freq: Every day | ORAL | Status: DC
  Administered 2015-07-02: 50 mg via ORAL
  Filled 2015-07-02: qty 1
  Filled 2015-07-02: qty 2
  Filled 2015-07-02: qty 1

## 2015-07-02 MED ORDER — ACETAMINOPHEN 325 MG PO TABS
650.0000 mg | ORAL_TABLET | Freq: Every day | ORAL | Status: DC
  Administered 2015-07-02: 650 mg via ORAL
  Filled 2015-07-02: qty 2

## 2015-07-02 MED ORDER — SODIUM CHLORIDE 0.9 % IV SOLN
INTRAVENOUS | Status: DC
  Administered 2015-07-02: 10:00:00 via INTRAVENOUS

## 2015-07-02 MED ORDER — SODIUM CHLORIDE 0.9 % IV SOLN
5.0000 mg/kg | INTRAVENOUS | Status: DC
  Administered 2015-07-02: 300 mg via INTRAVENOUS
  Filled 2015-07-02: qty 30

## 2015-07-02 NOTE — Discharge Instructions (Signed)
Remicade Infliximab injection What is this medicine? INFLIXIMAB (in [place] i mab) is used to treat Crohn's disease and ulcerative colitis. It is also used to treat ankylosing spondylitis, psoriasis, and some forms of arthritis. This medicine may be used for other purposes; ask your health care provider or pharmacist if you have questions. What should I tell my health care provider before I take this medicine? They need to know if you have any of these conditions: -diabetes -exposure to tuberculosis -heart failure -hepatitis or liver disease -immune system problems -infection -lung or breathing disease, like COPD -multiple sclerosis -current or past resident of [place] or [place] -seizure disorder -an unusual or allergic reaction to infliximab, mouse proteins, other medicines, foods, dyes, or preservatives -pregnant or trying to get pregnant -breast-feeding How should I use this medicine? This medicine is for injection into a vein. It is usually given by a health care professional in a hospital or clinic setting. A special MedGuide will be given to you by the pharmacist with each prescription and refill. Be sure to read this information carefully each time. Talk to your pediatrician regarding the use of this medicine in children. Special care may be needed. Overdosage: If you think you have taken too much of this medicine contact a poison control center or emergency room at once. NOTE: This medicine is only for you. Do not share this medicine with others. What if I miss a dose? It is important not to miss your dose. Call your doctor or health care professional if you are unable to keep an appointment. What may interact with this medicine? Do not take this medicine with any of the following medications: -anakinra -rilonacept This medicine may also interact with the following medications: -vaccines This list may not describe all possible interactions. Give your health care  provider a list of all the medicines, herbs, non-prescription drugs, or dietary supplements you use. Also tell them if you smoke, drink alcohol, or use illegal drugs. Some items may interact with your medicine. What should I watch for while using this medicine? Visit your doctor or health care professional for regular checks on your progress. If you get a cold or other infection while receiving this medicine, call your doctor or health care professional. Do not treat yourself. This medicine may decrease your body's ability to fight infections. Before beginning therapy, your doctor may do a test to see if you have been exposed to tuberculosis. This medicine may make the symptoms of heart failure worse in some patients. If you notice symptoms such as increased shortness of breath or swelling of the ankles or legs, contact your health care provider right away. If you are going to have surgery or dental work, tell your health care professional or dentist that you have received this medicine. If you take this medicine for plaque psoriasis, stay out of the sun. If you cannot avoid being in the sun, wear protective clothing and use sunscreen. Do not use sun lamps or tanning beds/booths. What side effects may I notice from receiving this medicine? Side effects that you should report to your doctor or health care professional as soon as possible: -allergic reactions like skin rash, itching or hives, swelling of the face, lips, or tongue -chest pain -fever or chills, usually related to the infusion -muscle or joint pain -red, scaly patches or raised bumps on the skin -signs of infection - fever or chills, cough, sore throat, pain or difficulty passing urine -swollen lymph nodes in the neck,  underarm, or groin areas -unexplained weight loss -unusual bleeding or bruising -unusually weak or tired -yellowing of the eyes or skin Side effects that usually do not require medical attention (report to your doctor  or health care professional if they continue or are bothersome): -headache -heartburn or stomach pain -nausea, vomiting This list may not describe all possible side effects. Call your doctor for medical advice about side effects. You may report side effects to FDA at 1-800-FDA-1088. Where should I keep my medicine? This drug is given in a hospital or clinic and will not be stored at home. NOTE: This sheet is a summary. It may not cover all possible information. If you have questions about this medicine, talk to your doctor, pharmacist, or health care provider.    2016, Elsevier/Gold Standard. (2008-03-29 10:26:02)

## 2015-07-12 ENCOUNTER — Other Ambulatory Visit: Payer: Self-pay | Admitting: Internal Medicine

## 2015-07-12 NOTE — Telephone Encounter (Signed)
Refill x 3 Please set up OV for Jan or Feb

## 2015-07-12 NOTE — Telephone Encounter (Signed)
Please advise. Thank you

## 2015-08-29 ENCOUNTER — Encounter (HOSPITAL_COMMUNITY)
Admission: RE | Admit: 2015-08-29 | Discharge: 2015-08-29 | Disposition: A | Discharge status: Home / Self Care | Payer: Medicaid Other | Source: Ambulatory Visit | Attending: Internal Medicine | Admitting: Internal Medicine

## 2015-08-29 ENCOUNTER — Encounter (HOSPITAL_COMMUNITY): Payer: Self-pay

## 2015-08-29 VITALS — BP 108/53 | HR 60 | Temp 98.4°F | Resp 18 | Ht 68.0 in | Wt 134.0 lb

## 2015-08-29 DIAGNOSIS — K50119 Crohn's disease of large intestine with unspecified complications: Secondary | ICD-10-CM

## 2015-08-29 MED ORDER — DIPHENHYDRAMINE HCL 25 MG PO CAPS
50.0000 mg | ORAL_CAPSULE | Freq: Every day | ORAL | Status: DC
  Administered 2015-08-29: 50 mg via ORAL
  Filled 2015-08-29: qty 2

## 2015-08-29 MED ORDER — SODIUM CHLORIDE 0.9 % IV SOLN
5.0000 mg/kg | INTRAVENOUS | Status: AC
  Administered 2015-08-29: 300 mg via INTRAVENOUS
  Filled 2015-08-29: qty 30

## 2015-08-29 MED ORDER — SODIUM CHLORIDE 0.9 % IV SOLN
INTRAVENOUS | Status: DC
  Administered 2015-08-29: 09:00:00 via INTRAVENOUS

## 2015-08-29 MED ORDER — ACETAMINOPHEN 325 MG PO TABS
650.0000 mg | ORAL_TABLET | Freq: Every day | ORAL | Status: DC
  Administered 2015-08-29: 650 mg via ORAL
  Filled 2015-08-29: qty 2

## 2015-10-22 ENCOUNTER — Other Ambulatory Visit: Payer: Self-pay | Admitting: Internal Medicine

## 2015-10-22 NOTE — Telephone Encounter (Signed)
Please advise Sir, thank you. 

## 2015-10-22 NOTE — Telephone Encounter (Signed)
OK to refill # [phone] x 2RF Contact him or grandfather and get him in please

## 2015-10-22 NOTE — Telephone Encounter (Signed)
Spoke with granddad and made an April appointment for [place].  Sent in Prednisone refills.

## 2015-10-23 ENCOUNTER — Other Ambulatory Visit: Payer: Self-pay

## 2015-10-23 DIAGNOSIS — K50119 Crohn's disease of large intestine with unspecified complications: Secondary | ICD-10-CM

## 2015-10-25 ENCOUNTER — Encounter (HOSPITAL_COMMUNITY): Payer: Self-pay

## 2015-10-25 ENCOUNTER — Encounter (HOSPITAL_COMMUNITY)
Admission: RE | Admit: 2015-10-25 | Discharge: 2015-10-25 | Disposition: A | Discharge status: Home / Self Care | Payer: Medicaid Other | Source: Ambulatory Visit | Attending: Internal Medicine | Admitting: Internal Medicine

## 2015-10-25 VITALS — BP 104/48 | HR 64 | Temp 98.0°F | Resp 18 | Ht 68.0 in | Wt 134.4 lb

## 2015-10-25 DIAGNOSIS — K50119 Crohn's disease of large intestine with unspecified complications: Secondary | ICD-10-CM | POA: Diagnosis present

## 2015-10-25 MED ORDER — DIPHENHYDRAMINE HCL 25 MG PO TABS
50.0000 mg | ORAL_TABLET | Freq: Every day | ORAL | Status: DC
  Administered 2015-10-25: 50 mg via ORAL
  Filled 2015-10-25 (×3): qty 2

## 2015-10-25 MED ORDER — ACETAMINOPHEN 325 MG PO TABS
650.0000 mg | ORAL_TABLET | Freq: Every day | ORAL | Status: DC
  Administered 2015-10-25: 650 mg via ORAL
  Filled 2015-10-25: qty 2

## 2015-10-25 MED ORDER — SODIUM CHLORIDE 0.9 % IV SOLN
5.0000 mg/kg | INTRAVENOUS | Status: DC
  Administered 2015-10-25: 300 mg via INTRAVENOUS
  Filled 2015-10-25: qty 30

## 2015-10-25 MED ORDER — SODIUM CHLORIDE 0.9 % IV SOLN
INTRAVENOUS | Status: DC
  Administered 2015-10-25: 09:00:00 via INTRAVENOUS

## 2015-10-25 NOTE — Discharge Instructions (Signed)
Infliximab injection What is this medicine? INFLIXIMAB (in [place] i mab) is used to treat Crohn's disease and ulcerative colitis. It is also used to treat ankylosing spondylitis, psoriasis, and some forms of arthritis. This medicine may be used for other purposes; ask your health care provider or pharmacist if you have questions. What should I tell my health care provider before I take this medicine? They need to know if you have any of these conditions: -diabetes -exposure to tuberculosis -heart failure -hepatitis or liver disease -immune system problems -infection -lung or breathing disease, like COPD -multiple sclerosis -current or past resident of [place] or [place] -seizure disorder -an unusual or allergic reaction to infliximab, mouse proteins, other medicines, foods, dyes, or preservatives -pregnant or trying to get pregnant -breast-feeding How should I use this medicine? This medicine is for injection into a vein. It is usually given by a health care professional in a hospital or clinic setting. A special MedGuide will be given to you by the pharmacist with each prescription and refill. Be sure to read this information carefully each time. Talk to your pediatrician regarding the use of this medicine in children. Special care may be needed. Overdosage: If you think you have taken too much of this medicine contact a poison control center or emergency room at once. NOTE: This medicine is only for you. Do not share this medicine with others. What if I miss a dose? It is important not to miss your dose. Call your doctor or health care professional if you are unable to keep an appointment. What may interact with this medicine? Do not take this medicine with any of the following medications: -anakinra -rilonacept This medicine may also interact with the following medications: -vaccines This list may not describe all possible interactions. Give your health care provider  a list of all the medicines, herbs, non-prescription drugs, or dietary supplements you use. Also tell them if you smoke, drink alcohol, or use illegal drugs. Some items may interact with your medicine. What should I watch for while using this medicine? Visit your doctor or health care professional for regular checks on your progress. If you get a cold or other infection while receiving this medicine, call your doctor or health care professional. Do not treat yourself. This medicine may decrease your body's ability to fight infections. Before beginning therapy, your doctor may do a test to see if you have been exposed to tuberculosis. This medicine may make the symptoms of heart failure worse in some patients. If you notice symptoms such as increased shortness of breath or swelling of the ankles or legs, contact your health care provider right away. If you are going to have surgery or dental work, tell your health care professional or dentist that you have received this medicine. If you take this medicine for plaque psoriasis, stay out of the sun. If you cannot avoid being in the sun, wear protective clothing and use sunscreen. Do not use sun lamps or tanning beds/booths. What side effects may I notice from receiving this medicine? Side effects that you should report to your doctor or health care professional as soon as possible: -allergic reactions like skin rash, itching or hives, swelling of the face, lips, or tongue -chest pain -fever or chills, usually related to the infusion -muscle or joint pain -red, scaly patches or raised bumps on the skin -signs of infection - fever or chills, cough, sore throat, pain or difficulty passing urine -swollen lymph nodes in the neck, underarm,  or groin areas -unexplained weight loss -unusual bleeding or bruising -unusually weak or tired -yellowing of the eyes or skin Side effects that usually do not require medical attention (report to your doctor or health  care professional if they continue or are bothersome): -headache -heartburn or stomach pain -nausea, vomiting This list may not describe all possible side effects. Call your doctor for medical advice about side effects. You may report side effects to FDA at 1-800-FDA-1088. Where should I keep my medicine? This drug is given in a hospital or clinic and will not be stored at home. NOTE: This sheet is a summary. It may not cover all possible information. If you have questions about this medicine, talk to your doctor, pharmacist, or health care provider.    2016, Elsevier/Gold Standard. (2008-03-29 10:26:02)

## 2015-11-27 ENCOUNTER — Ambulatory Visit (INDEPENDENT_AMBULATORY_CARE_PROVIDER_SITE_OTHER): Payer: Medicaid Other | Admitting: Internal Medicine

## 2015-11-27 ENCOUNTER — Encounter: Payer: Self-pay | Admitting: Internal Medicine

## 2015-11-27 ENCOUNTER — Other Ambulatory Visit (INDEPENDENT_AMBULATORY_CARE_PROVIDER_SITE_OTHER): Payer: Medicaid Other

## 2015-11-27 VITALS — BP 100/60 | HR 60 | Ht 67.75 in | Wt 134.4 lb

## 2015-11-27 DIAGNOSIS — F17200 Nicotine dependence, unspecified, uncomplicated: Secondary | ICD-10-CM

## 2015-11-27 DIAGNOSIS — E559 Vitamin D deficiency, unspecified: Secondary | ICD-10-CM | POA: Diagnosis not present

## 2015-11-27 DIAGNOSIS — Z720 Tobacco use: Secondary | ICD-10-CM

## 2015-11-27 DIAGNOSIS — K5669 Other intestinal obstruction: Secondary | ICD-10-CM

## 2015-11-27 DIAGNOSIS — Z7952 Long term (current) use of systemic steroids: Secondary | ICD-10-CM

## 2015-11-27 DIAGNOSIS — K56699 Other intestinal obstruction unspecified as to partial versus complete obstruction: Secondary | ICD-10-CM

## 2015-11-27 DIAGNOSIS — K50012 Crohn's disease of small intestine with intestinal obstruction: Secondary | ICD-10-CM | POA: Diagnosis not present

## 2015-11-27 HISTORY — DX: Long term (current) use of systemic steroids: Z79.52

## 2015-11-27 LAB — COMPREHENSIVE METABOLIC PANEL
ALT: 7 U/L (ref 0–53)
AST: 13 U/L (ref 0–37)
Albumin: 5 g/dL (ref 3.5–5.2)
Alkaline Phosphatase: 71 U/L (ref 39–117)
BUN: 10 mg/dL (ref 6–23)
CALCIUM: 10.1 mg/dL (ref 8.4–10.5)
CHLORIDE: 101 meq/L (ref 96–112)
CO2: 32 meq/L (ref 19–32)
CREATININE: 1.12 mg/dL (ref 0.40–1.50)
GFR: 103.83 mL/min (ref >60.00)
Glucose, Bld: 102 mg/dL — ABNORMAL HIGH (ref 70–99)
POTASSIUM: 4.4 meq/L (ref 3.5–5.1)
SODIUM: 138 meq/L (ref 135–145)
Total Bilirubin: 0.7 mg/dL (ref 0.2–1.2)
Total Protein: 8.1 g/dL (ref 6.0–8.3)

## 2015-11-27 LAB — CBC WITH DIFFERENTIAL/PLATELET
BASOS PCT: 0.3 % (ref 0.0–3.0)
Basophils Absolute: 0 10*3/uL (ref 0.0–0.1)
EOS ABS: 0 10*3/uL (ref 0.0–0.7)
EOS PCT: 0.1 % (ref 0.0–5.0)
HEMATOCRIT: 49.3 % (ref 39.0–52.0)
HEMOGLOBIN: 16.9 g/dL (ref 13.0–17.0)
LYMPHS PCT: 12 % (ref 12.0–46.0)
Lymphs Abs: 1.2 10*3/uL (ref 0.7–4.0)
MCHC: 34.3 g/dL (ref 30.0–36.0)
MCV: 92.8 fl (ref 78.0–100.0)
Monocytes Absolute: 0.1 10*3/uL (ref 0.1–1.0)
Monocytes Relative: 1.2 % — ABNORMAL LOW (ref 3.0–12.0)
NEUTROS ABS: 8.8 10*3/uL — AB (ref 1.4–7.7)
Neutrophils Relative %: 86.4 % — ABNORMAL HIGH (ref 43.0–77.0)
PLATELETS: 187 10*3/uL (ref 150.0–400.0)
RBC: 5.32 Mil/uL (ref 4.22–5.81)
RDW: 12.3 % (ref 11.5–15.5)
WBC: 10.2 10*3/uL (ref 4.0–10.5)

## 2015-11-27 LAB — VITAMIN D 25 HYDROXY (VIT D DEFICIENCY, FRACTURES): VITD: 24.8 ng/mL — ABNORMAL LOW (ref 30.00–100.00)

## 2015-11-27 MED ORDER — PREDNISONE 2.5 MG PO TABS: 7.5000 mg | ORAL_TABLET | Freq: Every day | ORAL | Status: DC

## 2015-11-27 NOTE — Assessment & Plan Note (Signed)
DEXA

## 2015-11-27 NOTE — Assessment & Plan Note (Signed)
CT-enterography

## 2015-11-27 NOTE — Assessment & Plan Note (Signed)
Counseled to quit again

## 2015-11-27 NOTE — Patient Instructions (Signed)
  We have sent the following medications to your pharmacy for you to pick up at your convenience: Prednisone   Your physician has requested that you go to the basement for lab work before leaving today.   You have been scheduled for a bone density test on 12/03/15 at 10:30AM, please arrive at 10:15AM. Please go to radiology on the basement floor of [facility] for this test. No preparation is necessary.   You have been scheduled for a CT scan of the abdomen and pelvis at [facility] ([address]---this is in the same building as [facility]).   You are scheduled on 12/05/15 at 2:30PM. You should arrive at 1:15PM  prior to your appointment time for registration. Please follow the written instructions below on the day of your exam:  WARNING: IF YOU ARE ALLERGIC TO IODINE/X-RAY DYE, PLEASE NOTIFY RADIOLOGY IMMEDIATELY AT [phone]! YOU WILL BE GIVEN A 13 HOUR PREMEDICATION PREP.  1) Do not eat or drink anything after 10:30AM (4 hours prior to your test) 2) You have been given 2 bottles of oral contrast to drink. The solution may taste   better if refrigerated, but do NOT add ice or any other liquid to this solution. Shake  well before drinking.    The purpose of you drinking the oral contrast is to aid in the visualization of your intestinal tract. The contrast solution may cause some diarrhea. Before your exam is started, you will be given a small amount of fluid to drink. Depending on your individual set of symptoms, you may also receive an intravenous injection of x-ray contrast/dye. Plan on being at [facility] for 30 minutes or longer, depending on the type of exam you are having performed.  This test typically takes 30-45 minutes to complete.  If you have any questions regarding your exam or if you need to reschedule, you may call the CT department at [phone] between the hours of 8:00 am and 5:00 pm,  Monday-Friday.  ________________________________________________________________________  I appreciate the opportunity to care for you.

## 2015-11-27 NOTE — Assessment & Plan Note (Signed)
Recheck level

## 2015-11-27 NOTE — Assessment & Plan Note (Signed)
Taper prednisone Labs

## 2015-11-27 NOTE — Progress Notes (Signed)
   Subjective:    Patient ID: Philip Joseph, male    DOB: March 08, 1991, 25 y.o.   MRN: [id number] Chief complaint: Follow-up Crohn's ileitis with stricture HPI The patient is here, with his grandfather as usual. He denies any diarrhea or significant abdominal pain. He wishes he could gain some weight he thinks he weighed more last year. These are his last three-way 3 was really the same last year. He continues to smoke cigarettes and probably marijuana. He is not working at this time, he is on SSI and has Medicaid. Wt Readings from Last 3 Encounters:  11/27/15 134 lb 6 oz (60.952 kg)  10/25/15 134 lb 6 oz (60.952 kg)  08/29/15 134 lb (60.782 kg)   Medications, allergies, past medical history, past surgical history, family history and social history are reviewed and updated in the EMR.   Review of Systems As above, he is also complaining of chronic recurrent headaches. They occur over his right eye in the frontal area it sounds like he's had these for a number of years. I cannot detect any all readily says he gets very tired and weak and goes to bed and then he feels better. He cannot identify any triggers. I have suggested they ask her primary care provider for a neurology referral    Objective:   Physical Exam NAD BP 100/60 mmHg  Pulse 60  Ht 5' 7.75" (1.721 m)  Wt 134 lb 6 oz (60.952 kg)  BMI 20.58 kg/m2 Anicteric abd scaphoid soft and NT       Assessment & Plan:   1. Crohn's ileitis, with intestinal obstruction ([place])   2. 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 Vitamin D deficiency   4. Long term current use of systemic steroids   5. Smoker    I will reassess the Crohn's disease and stricture with a CT enterography now that he is been on Remicade for over 6 months.  Labs to include a CBC and a metabolic panel today. Vitamin D level rechecked. He needs a DEXA scan due to long-term use of prednisone. Further lands pending these tests and I will arrange  follow-up after that.

## 2015-11-29 ENCOUNTER — Other Ambulatory Visit: Payer: Self-pay

## 2015-11-29 LAB — QUANTIFERON TB GOLD ASSAY (BLOOD)
INTERFERON GAMMA RELEASE ASSAY: NEGATIVE
QUANTIFERON NIL VALUE: 0.04 [IU]/mL
QUANTIFERON TB AG MINUS NIL: 0.02 [IU]/mL

## 2015-11-29 MED ORDER — VITAMIN D (ERGOCALCIFEROL) 1.25 MG (50000 UNIT) PO CAPS: 50000.0000 [IU] | ORAL_CAPSULE | ORAL | Status: DC

## 2015-11-29 NOTE — Progress Notes (Signed)
Quick Note:  Labs back - vit D better still on low side so ask him to take 50K U weekly x 12 no refills Other labs ok We did his TB ______

## 2015-12-03 ENCOUNTER — Ambulatory Visit (INDEPENDENT_AMBULATORY_CARE_PROVIDER_SITE_OTHER)
Admission: RE | Admit: 2015-12-03 | Discharge: 2015-12-03 | Disposition: A | Discharge status: Home / Self Care | Payer: Medicaid Other | Source: Ambulatory Visit | Attending: Internal Medicine | Admitting: Internal Medicine

## 2015-12-03 DIAGNOSIS — Z7952 Long term (current) use of systemic steroids: Secondary | ICD-10-CM

## 2015-12-05 ENCOUNTER — Ambulatory Visit (INDEPENDENT_AMBULATORY_CARE_PROVIDER_SITE_OTHER)
Admission: RE | Admit: 2015-12-05 | Discharge: 2015-12-05 | Disposition: A | Discharge status: Home / Self Care | Payer: Medicaid Other | Source: Ambulatory Visit | Attending: Internal Medicine | Admitting: Internal Medicine

## 2015-12-05 DIAGNOSIS — K56699 Other intestinal obstruction unspecified as to partial versus complete obstruction: Secondary | ICD-10-CM

## 2015-12-05 DIAGNOSIS — K50012 Crohn's disease of small intestine with intestinal obstruction: Secondary | ICD-10-CM

## 2015-12-05 DIAGNOSIS — K5669 Other intestinal obstruction: Secondary | ICD-10-CM | POA: Diagnosis not present

## 2015-12-05 MED ORDER — IOPAMIDOL (ISOVUE-300) INJECTION 61%
100.0000 mL | Freq: Once | INTRAVENOUS | Status: AC | PRN
  Administered 2015-12-05: 100 mL via INTRAVENOUS

## 2015-12-05 NOTE — Progress Notes (Signed)
Quick Note:  Let Philip Joseph/[name] know that things look better and also bones are normal I should see him in August - sooner if needed Stay on remicade - it is doing the job Taper prednisone to off as instructed at last visit ______

## 2015-12-20 ENCOUNTER — Encounter (HOSPITAL_COMMUNITY)
Admission: RE | Admit: 2015-12-20 | Discharge: 2015-12-20 | Disposition: A | Discharge status: Home / Self Care | Payer: Medicaid Other | Source: Ambulatory Visit | Attending: Internal Medicine | Admitting: Internal Medicine

## 2015-12-20 ENCOUNTER — Encounter (HOSPITAL_COMMUNITY): Payer: Self-pay

## 2015-12-20 VITALS — BP 114/61 | HR 58 | Temp 98.6°F | Resp 18 | Ht 68.0 in | Wt 135.0 lb

## 2015-12-20 DIAGNOSIS — K50119 Crohn's disease of large intestine with unspecified complications: Secondary | ICD-10-CM | POA: Diagnosis present

## 2015-12-20 MED ORDER — SODIUM CHLORIDE 0.9 % IV SOLN
5.0000 mg/kg | INTRAVENOUS | Status: AC
  Administered 2015-12-20: 300 mg via INTRAVENOUS
  Filled 2015-12-20: qty 30

## 2015-12-20 MED ORDER — DIPHENHYDRAMINE HCL 25 MG PO CAPS
50.0000 mg | ORAL_CAPSULE | Freq: Every day | ORAL | Status: DC
  Administered 2015-12-20: 50 mg via ORAL
  Filled 2015-12-20: qty 2

## 2015-12-20 MED ORDER — SODIUM CHLORIDE 0.9 % IV SOLN
INTRAVENOUS | Status: DC
  Administered 2015-12-20: 09:00:00 via INTRAVENOUS

## 2015-12-20 MED ORDER — ACETAMINOPHEN 325 MG PO TABS
650.0000 mg | ORAL_TABLET | ORAL | Status: DC
  Administered 2015-12-20: 650 mg via ORAL
  Filled 2015-12-20: qty 2

## 2016-01-17 ENCOUNTER — Telehealth: Payer: Self-pay | Admitting: Internal Medicine

## 2016-01-17 MED ORDER — DICYCLOMINE HCL 20 MG PO TABS: ORAL_TABLET | ORAL | Status: DC

## 2016-01-17 NOTE — Telephone Encounter (Signed)
Reviewed taper instructions for the patient and he needs a refill on dicyclomine

## 2016-01-17 NOTE — Telephone Encounter (Signed)
Attempted to return call.  Patient does not have a voicemail

## 2016-02-07 ENCOUNTER — Other Ambulatory Visit: Payer: Self-pay

## 2016-02-07 DIAGNOSIS — K50119 Crohn's disease of large intestine with unspecified complications: Secondary | ICD-10-CM

## 2016-02-07 NOTE — Addendum Note (Signed)
Addended by: Tyrone Sage on: 02/07/2016 04:28 PM   Modules accepted: Orders

## 2016-02-14 ENCOUNTER — Encounter (HOSPITAL_COMMUNITY): Payer: Self-pay

## 2016-02-14 ENCOUNTER — Encounter (HOSPITAL_COMMUNITY)
Admission: RE | Admit: 2016-02-14 | Discharge: 2016-02-14 | Disposition: A | Discharge status: Home / Self Care | Payer: Medicaid Other | Source: Ambulatory Visit | Attending: Internal Medicine | Admitting: Internal Medicine

## 2016-02-14 VITALS — BP 106/73 | HR 56 | Temp 98.1°F | Resp 18 | Ht 68.0 in | Wt 133.1 lb

## 2016-02-14 DIAGNOSIS — K50119 Crohn's disease of large intestine with unspecified complications: Secondary | ICD-10-CM | POA: Insufficient documentation

## 2016-02-14 MED ORDER — SODIUM CHLORIDE 0.9 % IV SOLN
Freq: Once | INTRAVENOUS | Status: AC
  Administered 2016-02-14: 13:00:00 via INTRAVENOUS

## 2016-02-14 MED ORDER — ACETAMINOPHEN 325 MG PO TABS
650.0000 mg | ORAL_TABLET | Freq: Once | ORAL | Status: AC
  Administered 2016-02-14: 650 mg via ORAL
  Filled 2016-02-14: qty 2

## 2016-02-14 MED ORDER — DIPHENHYDRAMINE HCL 25 MG PO CAPS
50.0000 mg | ORAL_CAPSULE | Freq: Once | ORAL | Status: AC
  Administered 2016-02-14: 50 mg via ORAL
  Filled 2016-02-14: qty 2

## 2016-02-14 MED ORDER — SODIUM CHLORIDE 0.9 % IV SOLN
5.0000 mg/kg | Freq: Once | INTRAVENOUS | Status: AC
  Administered 2016-02-14: 300 mg via INTRAVENOUS
  Filled 2016-02-14: qty 30

## 2016-04-07 ENCOUNTER — Other Ambulatory Visit: Payer: Self-pay

## 2016-04-07 DIAGNOSIS — K5010 Crohn's disease of large intestine without complications: Secondary | ICD-10-CM

## 2016-04-10 ENCOUNTER — Encounter (HOSPITAL_COMMUNITY)
Admission: RE | Admit: 2016-04-10 | Discharge: 2016-04-10 | Disposition: A | Discharge status: Home / Self Care | Payer: Medicaid Other | Source: Ambulatory Visit | Attending: Internal Medicine | Admitting: Internal Medicine

## 2016-04-10 ENCOUNTER — Encounter (HOSPITAL_COMMUNITY): Payer: Self-pay

## 2016-04-10 DIAGNOSIS — K5010 Crohn's disease of large intestine without complications: Secondary | ICD-10-CM

## 2016-04-10 DIAGNOSIS — K50119 Crohn's disease of large intestine with unspecified complications: Secondary | ICD-10-CM | POA: Insufficient documentation

## 2016-04-10 MED ORDER — ACETAMINOPHEN 325 MG PO TABS
650.0000 mg | ORAL_TABLET | Freq: Every day | ORAL | Status: DC
  Administered 2016-04-10: 650 mg via ORAL
  Filled 2016-04-10: qty 2

## 2016-04-10 MED ORDER — SODIUM CHLORIDE 0.9 % IV SOLN
5.0000 mg/kg | INTRAVENOUS | Status: DC
  Administered 2016-04-10: 300 mg via INTRAVENOUS
  Filled 2016-04-10: qty 30

## 2016-04-10 MED ORDER — SODIUM CHLORIDE 0.9 % IV SOLN
INTRAVENOUS | Status: DC
  Administered 2016-04-10: 11:00:00 via INTRAVENOUS

## 2016-04-10 MED ORDER — DIPHENHYDRAMINE HCL 25 MG PO CAPS
50.0000 mg | ORAL_CAPSULE | Freq: Every day | ORAL | Status: DC
  Administered 2016-04-10: 50 mg via ORAL
  Filled 2016-04-10: qty 2

## 2016-04-10 NOTE — Discharge Instructions (Signed)
Remicade Infliximab injection What is this medicine? INFLIXIMAB (in [place] i mab) is used to treat Crohn's disease and ulcerative colitis. It is also used to treat ankylosing spondylitis, psoriasis, and some forms of arthritis. This medicine may be used for other purposes; ask your health care provider or pharmacist if you have questions. What should I tell my health care provider before I take this medicine? They need to know if you have any of these conditions: -diabetes -exposure to tuberculosis -heart failure -hepatitis or liver disease -immune system problems -infection -lung or breathing disease, like COPD -multiple sclerosis -current or past resident of [place] or [place] -seizure disorder -an unusual or allergic reaction to infliximab, mouse proteins, other medicines, foods, dyes, or preservatives -pregnant or trying to get pregnant -breast-feeding How should I use this medicine? This medicine is for injection into a vein. It is usually given by a health care professional in a hospital or clinic setting. A special MedGuide will be given to you by the pharmacist with each prescription and refill. Be sure to read this information carefully each time. Talk to your pediatrician regarding the use of this medicine in children. Special care may be needed. Overdosage: If you think you have taken too much of this medicine contact a poison control center or emergency room at once. NOTE: This medicine is only for you. Do not share this medicine with others. What if I miss a dose? It is important not to miss your dose. Call your doctor or health care professional if you are unable to keep an appointment. What may interact with this medicine? Do not take this medicine with any of the following medications: -anakinra -rilonacept This medicine may also interact with the following medications: -vaccines This list may not describe all possible interactions. Give your health care  provider a list of all the medicines, herbs, non-prescription drugs, or dietary supplements you use. Also tell them if you smoke, drink alcohol, or use illegal drugs. Some items may interact with your medicine. What should I watch for while using this medicine? Visit your doctor or health care professional for regular checks on your progress. If you get a cold or other infection while receiving this medicine, call your doctor or health care professional. Do not treat yourself. This medicine may decrease your body's ability to fight infections. Before beginning therapy, your doctor may do a test to see if you have been exposed to tuberculosis. This medicine may make the symptoms of heart failure worse in some patients. If you notice symptoms such as increased shortness of breath or swelling of the ankles or legs, contact your health care provider right away. If you are going to have surgery or dental work, tell your health care professional or dentist that you have received this medicine. If you take this medicine for plaque psoriasis, stay out of the sun. If you cannot avoid being in the sun, wear protective clothing and use sunscreen. Do not use sun lamps or tanning beds/booths. What side effects may I notice from receiving this medicine? Side effects that you should report to your doctor or health care professional as soon as possible: -allergic reactions like skin rash, itching or hives, swelling of the face, lips, or tongue -chest pain -fever or chills, usually related to the infusion -muscle or joint pain -red, scaly patches or raised bumps on the skin -signs of infection - fever or chills, cough, sore throat, pain or difficulty passing urine -swollen lymph nodes in the neck,  underarm, or groin areas -unexplained weight loss -unusual bleeding or bruising -unusually weak or tired -yellowing of the eyes or skin Side effects that usually do not require medical attention (report to your doctor  or health care professional if they continue or are bothersome): -headache -heartburn or stomach pain -nausea, vomiting This list may not describe all possible side effects. Call your doctor for medical advice about side effects. You may report side effects to FDA at 1-800-FDA-1088. Where should I keep my medicine? This drug is given in a hospital or clinic and will not be stored at home. NOTE: This sheet is a summary. It may not cover all possible information. If you have questions about this medicine, talk to your doctor, pharmacist, or health care provider.    2016, Elsevier/Gold Standard. (2008-03-29 10:26:02)

## 2016-06-04 ENCOUNTER — Encounter (HOSPITAL_COMMUNITY): Payer: Medicaid Other

## 2016-06-05 ENCOUNTER — Encounter (HOSPITAL_COMMUNITY)
Admission: RE | Admit: 2016-06-05 | Payer: Medicaid Other | Source: Ambulatory Visit | Attending: Internal Medicine | Admitting: Internal Medicine

## 2016-07-23 ENCOUNTER — Emergency Department (HOSPITAL_COMMUNITY)
Admission: EM | Admit: 2016-07-23 | Discharge: 2016-07-24 | Disposition: A | Discharge status: Home / Self Care | Payer: Medicaid Other | Attending: Emergency Medicine | Admitting: Emergency Medicine

## 2016-07-23 ENCOUNTER — Encounter (HOSPITAL_COMMUNITY): Payer: Self-pay | Admitting: Oncology

## 2016-07-23 DIAGNOSIS — R112 Nausea with vomiting, unspecified: Secondary | ICD-10-CM | POA: Insufficient documentation

## 2016-07-23 DIAGNOSIS — K5090 Crohn's disease, unspecified, without complications: Secondary | ICD-10-CM | POA: Insufficient documentation

## 2016-07-23 DIAGNOSIS — Z8719 Personal history of other diseases of the digestive system: Secondary | ICD-10-CM

## 2016-07-23 DIAGNOSIS — Z79899 Other long term (current) drug therapy: Secondary | ICD-10-CM | POA: Insufficient documentation

## 2016-07-23 DIAGNOSIS — F909 Attention-deficit hyperactivity disorder, unspecified type: Secondary | ICD-10-CM | POA: Diagnosis not present

## 2016-07-23 DIAGNOSIS — F17200 Nicotine dependence, unspecified, uncomplicated: Secondary | ICD-10-CM | POA: Diagnosis not present

## 2016-07-23 DIAGNOSIS — R197 Diarrhea, unspecified: Secondary | ICD-10-CM

## 2016-07-23 NOTE — ED Notes (Signed)
Bed: NM07 Expected date:  Expected time:  Means of arrival:  Comments: N.v hx of crohn's disease

## 2016-07-23 NOTE — ED Provider Notes (Signed)
[name] DEPT Provider Note   CSN: [id number] Arrival date & time: 07/23/16  2347  By signing my name below, I, [name], attest that this documentation has been prepared under the direction and in the presence of Veryl Speak, MD. Electronically Signed: [name], ED Scribe. 07/24/16. 12:08 AM.  History   Chief Complaint Chief Complaint  Patient presents with  . Abdominal Pain   The history is provided by the patient. No language interpreter was used.   HPI Comments: Philip Joseph is a 25 y.o. Male with a hx of Crohn's ileitis and IBS brought in by EMS who presents to the Emergency Department complaining of nausea and vomiting onset one day ago.  Pt reports associated LLQ abdominal pain, lower back pain,  and diarrhea. Pt says that his symptoms began after eating chicken. He says that his symptoms feel like a Crohn's flare-up. He denies constipation.   Past Medical History:  Diagnosis Date  . ADHD (attention deficit hyperactivity disorder)   . Crohn's ileitis ([place]) 12/19/2013  . IBS (irritable bowel syndrome)   . Long term current use of systemic steroids 11/27/2015  . 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 Unspecified vitamin D deficiency 12/21/2013    Patient Active Problem List   Diagnosis Date Noted  . Long term current use of systemic steroids 11/27/2015  . 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 Smoker 11/29/2014  . Vitamin D deficiency 12/21/2013  . 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 Crohn's ileitis ([place]) 12/19/2013  . Attention deficit hyperactivity disorder (ADHD) 04/22/2010    Past Surgical History:  Procedure Laterality Date  . COLONOSCOPY N/A 12/20/2013   Procedure: COLONOSCOPY;  Surgeon: [name], MD;  Location: [facility] ENDOSCOPY;  Service: Endoscopy;  Laterality: N/A;  . COLONOSCOPY    . WRIST FRACTURE SURGERY       Home Medications    Prior to Admission medications   Medication Sig Start  Date End Date Taking? Authorizing Provider  dicyclomine (BENTYL) 20 MG tablet Take 1 tablet by mouth every 6 hours as needed 01/17/16   [name], MD  diphenhydrAMINE (BENADRYL) 25 MG tablet Take 25 mg by mouth every 6 (six) hours as needed for allergies (allergies).    Historical Provider, MD  ergocalciferol (VITAMIN D2) 50000 UNITS capsule Take 1 capsule (50,000 Units total) by mouth once a week. 02/08/15   [name], MD  Hydrocodone-Acetaminophen 5-300 MG TABS Take 1 capsule by mouth every 6 (six) hours as needed (pain).  09/26/14   Historical Provider, MD  predniSONE (DELTASONE) 2.5 MG tablet Take 3 tablets (7.5 mg total) by mouth daily with breakfast. X 2 weeks then 2 tablets daily x 2 weeks then 1 tablet daily x 2 weeks and stop 11/27/15   [name], MD  Vitamin D, Ergocalciferol, (DRISDOL) 50000 units CAPS capsule Take 1 capsule (50,000 Units total) by mouth every 7 (seven) days. 11/29/15   [name], MD    Family History Family History  Problem Relation Age of Onset  . Prostate cancer Father     Social History Social History  Substance Use Topics  . Smoking status: Current Every Day Smoker  . Smokeless tobacco: Never Used  . Alcohol use No     Allergies   Penicillins  Review of Systems Review of Systems  Gastrointestinal: Positive for abdominal pain, diarrhea, nausea and vomiting. Negative for constipation.  All other systems reviewed and are negative.  Physical Exam Updated Vital  Signs BP 138/92 (BP Location: Right Arm)   Pulse 61   Temp 98.3 F (36.8 C) (Oral)   Resp 18   Ht 5' 8"  (1.727 m)   Wt 130 lb (59 kg)   SpO2 100%   BMI 19.77 kg/m   Physical Exam  Constitutional: He appears well-developed and well-nourished. No distress.  HENT:  Head: Normocephalic and atraumatic.  Mouth/Throat: Oropharynx is clear and moist. No oropharyngeal exudate.  Eyes: Conjunctivae and EOM are normal. Pupils are equal, round, and reactive to light. Right eye  exhibits no discharge. Left eye exhibits no discharge. No scleral icterus.  Neck: Normal range of motion. Neck supple. No JVD present. No thyromegaly present.  Cardiovascular: Normal rate, regular rhythm, normal heart sounds and intact distal pulses.  Exam reveals no gallop and no friction rub.   No murmur heard. Pulmonary/Chest: Effort normal and breath sounds normal. No respiratory distress. He has no wheezes. He has no rales.  Abdominal: Soft. Bowel sounds are normal. He exhibits no distension and no mass. There is tenderness in the left lower quadrant.  Musculoskeletal: Normal range of motion. He exhibits no edema or tenderness.  Lymphadenopathy:    He has no cervical adenopathy.  Neurological: He is alert. Coordination normal.  Skin: Skin is warm. No rash noted. He is diaphoretic. No erythema.  Psychiatric: He has a normal mood and affect. His behavior is normal.  Nursing note and vitals reviewed.    ED Treatments / Results  DIAGNOSTIC STUDIES: Oxygen Saturation is 100% on RA, normal by my interpretation.    COORDINATION OF CARE: 12:07 AM-Discussed treatment plan which includes IV fluids and labs with pt at bedside and pt agreed to plan.    Labs (all labs ordered are listed, but only abnormal results are displayed) Labs Reviewed  COMPREHENSIVE METABOLIC PANEL - Abnormal; Notable for the following:       Result Value   Potassium 3.2 (*)    Glucose, Bld 109 (*)    Albumin 5.1 (*)    Total Bilirubin 1.4 (*)    All other components within normal limits  CBC - Abnormal; Notable for the following:    WBC 15.9 (*)    MCHC 36.4 (*)    All other components within normal limits  LIPASE, BLOOD  URINALYSIS, ROUTINE W REFLEX MICROSCOPIC (NOT AT [facility])    EKG  EKG Interpretation None       Radiology Dg Abd Acute W/chest  Result Date: 07/24/2016 CLINICAL DATA:  History of Crohn's disease with lower abdominal pain, vomiting and fever. EXAM: DG ABDOMEN ACUTE W/ 1V CHEST  COMPARISON:  None. FINDINGS: There is no evidence of dilated bowel loops or free intraperitoneal air. No radiopaque calculi or other significant radiographic abnormality is seen. Heart size and mediastinal contours are within normal limits allowing for patient rotation. Both lungs are clear. IMPRESSION: Negative abdominal radiographs.  No acute cardiopulmonary disease. Electronically Signed   By: Ashley Royalty M.D.   On: 07/24/2016 00:54    Procedures Procedures (including critical care time)  Medications Ordered in ED Medications - No data to display   Initial Impression / Assessment and Plan / ED Course  I have reviewed the triage vital signs and the nursing notes.  Pertinent labs & imaging results that were available during my care of the patient were reviewed by me and considered in my medical decision making (see chart for details).  Clinical Course     Patient with history of Crohn's disease who  is noncompliant with his medications and follow-up appointments. He presents today with abdominal pain and vomiting. He also reports diarrhea. All this is been nonbloody. He was given IV fluids, pain and nausea medication in the ER and now is feeling better. His laboratory studies are reassuring. He will be discharged with prednisone, pain medication, nausea medication, and advise follow-up with his gastroenterologist.  Final Clinical Impressions(s) / ED Diagnoses   Final diagnoses:  None  I personally performed the services described in this documentation, which was scribed in my presence. The recorded information has been reviewed and is accurate.      New Prescriptions New Prescriptions   No medications on file     Veryl Speak, MD 07/24/16 [phone]

## 2016-07-24 ENCOUNTER — Other Ambulatory Visit: Payer: Self-pay

## 2016-07-24 ENCOUNTER — Emergency Department (HOSPITAL_COMMUNITY): Payer: Medicaid Other

## 2016-07-24 DIAGNOSIS — K50118 Crohn's disease of large intestine with other complication: Secondary | ICD-10-CM

## 2016-07-24 LAB — COMPREHENSIVE METABOLIC PANEL
ALBUMIN: 5.1 g/dL — AB (ref 3.5–5.0)
ALT: 28 U/L (ref 17–63)
ANION GAP: 11 (ref 5–15)
AST: 37 U/L (ref 15–41)
Alkaline Phosphatase: 87 U/L (ref 38–126)
BUN: 10 mg/dL (ref 6–20)
CHLORIDE: 104 mmol/L (ref 101–111)
CO2: 23 mmol/L (ref 22–32)
Calcium: 9.3 mg/dL (ref 8.9–10.3)
Creatinine, Ser: 1.1 mg/dL (ref 0.61–1.24)
GFR calc Af Amer: >60 mL/min (ref >60)
GFR calc non Af Amer: >60 mL/min (ref >60)
GLUCOSE: 109 mg/dL — AB (ref 65–99)
POTASSIUM: 3.2 mmol/L — AB (ref 3.5–5.1)
Sodium: 138 mmol/L (ref 135–145)
Total Bilirubin: 1.4 mg/dL — ABNORMAL HIGH (ref 0.3–1.2)
Total Protein: 7.9 g/dL (ref 6.5–8.1)

## 2016-07-24 LAB — CBC
HEMATOCRIT: 44.2 % (ref 39.0–52.0)
HEMOGLOBIN: 16.1 g/dL (ref 13.0–17.0)
MCH: 31.9 pg (ref 26.0–34.0)
MCHC: 36.4 g/dL — ABNORMAL HIGH (ref 30.0–36.0)
MCV: 87.5 fL (ref 78.0–100.0)
Platelets: 178 10*3/uL (ref 150–400)
RBC: 5.05 MIL/uL (ref 4.22–5.81)
RDW: 12.1 % (ref 11.5–15.5)
WBC: 15.9 10*3/uL — AB (ref 4.0–10.5)

## 2016-07-24 LAB — URINALYSIS, ROUTINE W REFLEX MICROSCOPIC
Bilirubin Urine: NEGATIVE
GLUCOSE, UA: NEGATIVE mg/dL
Hgb urine dipstick: NEGATIVE
KETONES UR: 40 mg/dL — AB
LEUKOCYTES UA: NEGATIVE
Nitrite: NEGATIVE
PH: 7.5 (ref 5.0–8.0)
Protein, ur: 30 mg/dL — AB
SPECIFIC GRAVITY, URINE: 1.029 (ref 1.005–1.030)

## 2016-07-24 LAB — URINE MICROSCOPIC-ADD ON
Bacteria, UA: NONE SEEN
RBC / HPF: NONE SEEN RBC/hpf (ref 0–5)
Squamous Epithelial / LPF: NONE SEEN
WBC UA: NONE SEEN WBC/hpf (ref 0–5)

## 2016-07-24 LAB — LIPASE, BLOOD: LIPASE: 20 U/L (ref 11–51)

## 2016-07-24 MED ORDER — DIPHENHYDRAMINE HCL 50 MG PO TABS: 50.0000 mg | ORAL_TABLET | Freq: Every day | ORAL | Status: AC

## 2016-07-24 MED ORDER — ONDANSETRON HCL 4 MG/2ML IJ SOLN
4.0000 mg | Freq: Once | INTRAMUSCULAR | Status: AC
  Administered 2016-07-24: 4 mg via INTRAVENOUS
  Filled 2016-07-24: qty 2

## 2016-07-24 MED ORDER — MORPHINE SULFATE (PF) 4 MG/ML IV SOLN
4.0000 mg | Freq: Once | INTRAVENOUS | Status: AC
  Administered 2016-07-24: 4 mg via INTRAVENOUS
  Filled 2016-07-24: qty 1

## 2016-07-24 MED ORDER — SODIUM CHLORIDE 0.9 % IV BOLUS (SEPSIS)
1000.0000 mL | Freq: Once | INTRAVENOUS | Status: AC
  Administered 2016-07-24: 1000 mL via INTRAVENOUS

## 2016-07-24 MED ORDER — ONDANSETRON 8 MG PO TBDP: ORAL_TABLET | ORAL | 0 refills | Status: DC

## 2016-07-24 MED ORDER — PREDNISONE 10 MG PO TABS: 20.0000 mg | ORAL_TABLET | Freq: Two times a day (BID) | ORAL | 0 refills | Status: DC

## 2016-07-24 MED ORDER — SODIUM CHLORIDE 0.9 % IV SOLN: 5.0000 mg/kg | INTRAVENOUS | Status: AC

## 2016-07-24 MED ORDER — ACETAMINOPHEN 325 MG PO TABS: 650.0000 mg | ORAL_TABLET | Freq: Every day | ORAL | Status: AC

## 2016-07-24 MED ORDER — SODIUM CHLORIDE 0.9 % IV SOLN: INTRAVENOUS | Status: AC

## 2016-07-24 MED ORDER — KETOROLAC TROMETHAMINE 30 MG/ML IJ SOLN
30.0000 mg | Freq: Once | INTRAMUSCULAR | Status: AC
  Administered 2016-07-24: 30 mg via INTRAVENOUS
  Filled 2016-07-24: qty 1

## 2016-07-24 MED ORDER — HYDROCODONE-ACETAMINOPHEN 5-325 MG PO TABS: 2.0000 | ORAL_TABLET | ORAL | 0 refills | Status: DC | PRN

## 2016-07-24 NOTE — ED Notes (Signed)
Pt is resting.  Appears more comfortable.  Pt reports pain 3/10, again requesting pain medication.  Will inform Dr. Stark Jock.

## 2016-07-24 NOTE — ED Triage Notes (Signed)
Pt brought via EMS for N/V and abd pain.  Reports eating baked chicken 1.5 hr ago then 30 min after symptoms starting.  No one else in family has issues.  Pt has hx crohns and reports taking last dicyclomine this evening.  Pt given 4 mg zofran by EMS.  20 G IV LAC. VS: 131/84, 58 bpm, 18 RR

## 2016-07-24 NOTE — ED Notes (Signed)
Pt. Made aware for the need of urine, pt. Given urinal.

## 2016-07-24 NOTE — ED Notes (Signed)
Pt to CT Will attempt blood draw when he returns

## 2016-07-24 NOTE — ED Notes (Signed)
Pt continues to not participate.  Informed pt that if he continued to spin around in the bed he was going to pull out his IV catheter despite this writer adding extra tape to secure site.  Attempted to start another IV on pt however he would not remain still long enough to insert IV.  Will inform Dr. Stark Jock.

## 2016-07-24 NOTE — ED Notes (Signed)
Pt is reluctant to participate in physical exam or H&P.  Have to ask pt question multiple times before he will respond. Pt asking for pain medication.

## 2016-07-24 NOTE — Discharge Instructions (Signed)
Prednisone as prescribed.  Hydrocodone as prescribed as needed for pain.  Zofran as prescribed as needed for nausea.  Clear liquid diet for the next 12 hours, then slowly advance to normal.  Return to the emergency department if symptoms significantly worsen or change.

## 2016-07-24 NOTE — ED Notes (Signed)
Pt shaking and wrapped in the blanket Pt also dry heaving Will draw blood once pt calms down RN Gretta Arab  made aware

## 2016-07-25 ENCOUNTER — Other Ambulatory Visit: Payer: Self-pay

## 2016-07-25 DIAGNOSIS — K50118 Crohn's disease of large intestine with other complication: Secondary | ICD-10-CM

## 2016-07-30 ENCOUNTER — Encounter (HOSPITAL_COMMUNITY): Payer: Medicaid Other

## 2016-07-31 ENCOUNTER — Encounter (HOSPITAL_COMMUNITY)
Admission: RE | Admit: 2016-07-31 | Payer: Medicaid Other | Source: Ambulatory Visit | Attending: Internal Medicine | Admitting: Internal Medicine

## 2016-12-03 ENCOUNTER — Telehealth: Payer: Self-pay | Admitting: Internal Medicine

## 2016-12-03 NOTE — Telephone Encounter (Signed)
Patient reports that he is having abdominal pain again.  He no showed his Remicades last October and November.  He will come in and see Dr. Carlean Purl on 12/09/16 2:15 to talk about it.

## 2016-12-09 ENCOUNTER — Other Ambulatory Visit (INDEPENDENT_AMBULATORY_CARE_PROVIDER_SITE_OTHER): Payer: Medicaid Other

## 2016-12-09 ENCOUNTER — Ambulatory Visit (INDEPENDENT_AMBULATORY_CARE_PROVIDER_SITE_OTHER): Payer: Medicaid Other | Admitting: Internal Medicine

## 2016-12-09 ENCOUNTER — Encounter: Payer: Self-pay | Admitting: Internal Medicine

## 2016-12-09 VITALS — BP 120/78 | HR 84 | Ht 68.0 in | Wt 133.4 lb

## 2016-12-09 DIAGNOSIS — Z79899 Other long term (current) drug therapy: Secondary | ICD-10-CM

## 2016-12-09 DIAGNOSIS — Z7960 Long term (current) use of unspecified immunomodulators and immunosuppressants: Secondary | ICD-10-CM

## 2016-12-09 DIAGNOSIS — K5000 Crohn's disease of small intestine without complications: Secondary | ICD-10-CM | POA: Diagnosis not present

## 2016-12-09 LAB — CBC WITH DIFFERENTIAL/PLATELET
BASOS ABS: 0 10*3/uL (ref 0.0–0.1)
Basophils Relative: 0.5 % (ref 0.0–3.0)
EOS PCT: 0.6 % (ref 0.0–5.0)
Eosinophils Absolute: 0 10*3/uL (ref 0.0–0.7)
HCT: 43.9 % (ref 39.0–52.0)
Hemoglobin: 15 g/dL (ref 13.0–17.0)
LYMPHS ABS: 3 10*3/uL (ref 0.7–4.0)
Lymphocytes Relative: 33.1 % (ref 12.0–46.0)
MCHC: 34.3 g/dL (ref 30.0–36.0)
MCV: 90.5 fl (ref 78.0–100.0)
MONOS PCT: 4.3 % (ref 3.0–12.0)
Monocytes Absolute: 0.4 10*3/uL (ref 0.1–1.0)
NEUTROS ABS: 5.5 10*3/uL (ref 1.4–7.7)
NEUTROS PCT: 61.5 % (ref 43.0–77.0)
PLATELETS: 184 10*3/uL (ref 150.0–400.0)
RBC: 4.85 Mil/uL (ref 4.22–5.81)
RDW: 12.3 % (ref 11.5–15.5)
WBC: 9 10*3/uL (ref 4.0–10.5)

## 2016-12-09 LAB — COMPREHENSIVE METABOLIC PANEL
ALT: 7 U/L (ref 0–53)
AST: 15 U/L (ref 0–37)
Albumin: 4.6 g/dL (ref 3.5–5.2)
Alkaline Phosphatase: 94 U/L (ref 39–117)
BILIRUBIN TOTAL: 0.6 mg/dL (ref 0.2–1.2)
BUN: 8 mg/dL (ref 6–23)
CO2: 30 mEq/L (ref 19–32)
Calcium: 9.5 mg/dL (ref 8.4–10.5)
Chloride: 105 mEq/L (ref 96–112)
Creatinine, Ser: 1.01 mg/dL (ref 0.40–1.50)
GFR: 115.97 mL/min (ref >60.00)
GLUCOSE: 102 mg/dL — AB (ref 70–99)
Potassium: 3.8 mEq/L (ref 3.5–5.1)
SODIUM: 140 meq/L (ref 135–145)
TOTAL PROTEIN: 7.5 g/dL (ref 6.0–8.3)

## 2016-12-09 MED ORDER — DICYCLOMINE HCL 20 MG PO TABS
20.0000 mg | ORAL_TABLET | Freq: Four times a day (QID) | ORAL | 5 refills | Status: DC | PRN
Start: 2016-12-09 — End: 2017-04-30

## 2016-12-09 NOTE — Patient Instructions (Signed)
Your physician has requested that you go to the basement for lab work before leaving today.   We are giving you a printed rx for Bentyl to take to your pharmacy.    We will contact you with results and plans.    I appreciate the opportunity to care for you. Silvano Rusk, MD, [facility]

## 2016-12-09 NOTE — Progress Notes (Signed)
   Philip Joseph 26 y.o. 1991-03-28 [id number]  Assessment & Plan:   Encounter Diagnoses  Name Primary?  . Crohn's disease of ileum without complication ([place]) Yes  . Long-term use of immunosuppressant medication- Remicade     He has deteriorated and having some symptoms off tx.  Need to get him back on Remicade. I'm not sure what happened he says he was not contacted like he was so he didn't show. He is willing to do this. QuantiFERON will be rechecked. It was and was normal. We can go ahead with reinitiating Remicade. Since it is been so long since he has had it we will do induction again.  Dicyclomine is refilled for symptomatic relief.  Also needs Prevnar vaccine  Plan for follow-up in about 4 months. Subjective:   Chief Complaint: abdominal pain, Crohn's  HPI The patient is here for follow-up, he is not having his Remicade infusions, last one was in August 2017. He says he was used to getting phone call 0 In the nose stopped so he did not show. He was in the emergency department in November of last year with nausea and vomiting and abdominal pain and was short course of steroids. He is having some right lower quadrant pain, maybe a little bit of diarrhea but in general is tolerating wife of a this point without too many symptoms.  About to be a father in 2 mos  Living w/ his mom now and not his grandparents.  Allergies  Allergen Reactions  . Penicillins Rash    Has patient had a PCN reaction causing immediate rash, facial/tongue/throat swelling, SOB or lightheadedness with hypotension: No Has patient had a PCN reaction causing severe rash involving mucus membranes or skin necrosis: No Has patient had a PCN reaction that required hospitalization No Has patient had a PCN reaction occurring within the last 10 years: No If all of the above answers are "NO", then may proceed with Cephalosporin use.    Current Meds  Medication Sig  . dicyclomine (BENTYL) 20 MG  tablet Take 1 tablet (20 mg total) by mouth every 6 (six) hours as needed for spasms (abdominal pain).   Review of Systems As per history of present illness  Objective:   Physical Exam @BP  120/78   Pulse 84   Ht 5' 8"  (1.727 m)   Wt 133 lb 6.4 oz (60.5 kg)   SpO2 98%   BMI 20.28 kg/m @  General:  NAD Eyes:   anicteric Lungs:  clear Heart::  S1S2 no rubs, murmurs or gallops Abdomen:  soft and nontender, BS+ Ext:   no edema, cyanosis or clubbing   Data Reviewed:  See history of present illness

## 2016-12-11 LAB — QUANTIFERON TB GOLD ASSAY (BLOOD)
INTERFERON GAMMA RELEASE ASSAY: NEGATIVE
Quantiferon Nil Value: 0.09 IU/mL
Quantiferon Tb Ag Minus Nil Value: 0.02 IU/mL

## 2016-12-15 NOTE — Progress Notes (Signed)
Labs ok - quantiferon ok Please restart remicade 5 mg/kg IV - needs 0,2,6 week then every 8 weeks Also needs Prevnar vaccine RTC 4 mos approx

## 2016-12-17 ENCOUNTER — Other Ambulatory Visit: Payer: Self-pay

## 2016-12-17 DIAGNOSIS — K50111 Crohn's disease of large intestine with rectal bleeding: Secondary | ICD-10-CM

## 2016-12-17 MED ORDER — SODIUM CHLORIDE 0.9 % IV SOLN: 5.0000 mg/kg | INTRAVENOUS | Status: DC

## 2016-12-17 MED ORDER — SODIUM CHLORIDE 0.9 % IV SOLN: 5.0000 mg/kg | Freq: Once | INTRAVENOUS | Status: DC

## 2016-12-17 MED ORDER — ACETAMINOPHEN 325 MG PO TABS: 650.0000 mg | ORAL_TABLET | Freq: Every day | ORAL | Status: DC

## 2016-12-17 MED ORDER — DIPHENHYDRAMINE HCL 50 MG PO TABS: 50.0000 mg | ORAL_TABLET | Freq: Every day | ORAL | Status: DC

## 2016-12-17 MED ORDER — SODIUM CHLORIDE 0.9 % IV SOLN: INTRAVENOUS | Status: DC

## 2016-12-17 NOTE — Progress Notes (Signed)
Patient has been scheduled for Remicade induction. He is notified of the dates of 5/7, 5/21, 6/18, at 10:00 then every 8 weeks starting on 04/06/17.  He verbalized understanding and read back the dates to me.  He needs to call back to schedule Prevnar vaccine when he can get a ride.  Recall OV placed for 4 months

## 2016-12-17 NOTE — Addendum Note (Signed)
Addended by: Marlon Pel on: 12/17/2016 12:06 PM   Modules accepted: Orders

## 2016-12-17 NOTE — Addendum Note (Signed)
Addended by: Marlon Pel on: 12/17/2016 12:21 PM   Modules accepted: Orders

## 2016-12-17 NOTE — Addendum Note (Signed)
Addended by: Marlon Pel on: 12/17/2016 11:53 AM   Modules accepted: Orders

## 2016-12-29 ENCOUNTER — Encounter (HOSPITAL_COMMUNITY)
Admission: RE | Admit: 2016-12-29 | Discharge: 2016-12-29 | Disposition: A | Discharge status: Home / Self Care | Payer: Medicaid Other | Source: Ambulatory Visit | Attending: Internal Medicine | Admitting: Internal Medicine

## 2016-12-29 ENCOUNTER — Encounter (HOSPITAL_COMMUNITY): Payer: Self-pay

## 2016-12-29 DIAGNOSIS — K50111 Crohn's disease of large intestine with rectal bleeding: Secondary | ICD-10-CM | POA: Insufficient documentation

## 2016-12-29 MED ORDER — ACETAMINOPHEN 325 MG PO TABS
650.0000 mg | ORAL_TABLET | Freq: Every day | ORAL | Status: DC
  Administered 2016-12-29: 650 mg via ORAL
  Filled 2016-12-29: qty 2

## 2016-12-29 MED ORDER — SODIUM CHLORIDE 0.9 % IV SOLN
INTRAVENOUS | Status: DC
  Administered 2016-12-29: 11:00:00 via INTRAVENOUS

## 2016-12-29 MED ORDER — SODIUM CHLORIDE 0.9 % IV SOLN
5.0000 mg/kg | Freq: Once | INTRAVENOUS | Status: AC
  Administered 2016-12-29: 300 mg via INTRAVENOUS
  Filled 2016-12-29: qty 30

## 2016-12-29 MED ORDER — DIPHENHYDRAMINE HCL 25 MG PO CAPS
50.0000 mg | ORAL_CAPSULE | Freq: Every day | ORAL | Status: DC
  Administered 2016-12-29: 50 mg via ORAL
  Filled 2016-12-29: qty 2

## 2016-12-29 NOTE — Discharge Instructions (Signed)
Infliximab injection What is this medicine? INFLIXIMAB (in [place] i mab) is used to treat Crohn's disease and ulcerative colitis. It is also used to treat ankylosing spondylitis, plaque psoriasis, and some forms of arthritis. This medicine may be used for other purposes; ask your health care provider or pharmacist if you have questions. COMMON BRAND NAME(S): INFLECTRA, Remicade, RENFLEXIS What should I tell my health care provider before I take this medicine? They need to know if you have any of these conditions: -cancer -current or past resident of [place] or [place] -diabetes -exposure to tuberculosis -Guillain-Barre syndrome -heart failure -hepatitis or liver disease -immune system problems -infection -lung or breathing disease, like COPD -multiple sclerosis -receiving phototherapy for the skin -seizure disorder -an unusual or allergic reaction to infliximab, mouse proteins, other medicines, foods, dyes, or preservatives -pregnant or trying to get pregnant -breast-feeding How should I use this medicine? This medicine is for injection into a vein. It is usually given by a health care professional in a hospital or clinic setting. A special MedGuide will be given to you by the pharmacist with each prescription and refill. Be sure to read this information carefully each time. Talk to your pediatrician regarding the use of this medicine in children. While this drug may be prescribed for children as young as 26 years of age for selected conditions, precautions do apply. Overdosage: If you think you have taken too much of this medicine contact a poison control center or emergency room at once. NOTE: This medicine is only for you. Do not share this medicine with others. What if I miss a dose? It is important not to miss your dose. Call your doctor or health care professional if you are unable to keep an appointment. What may interact with this medicine? Do not take this  medicine with any of the following medications: -biologic medicines such as abatacept, adalimumab, anakinra, certolizumab, etanercept, golimumab, rituximab, secukinumab, tocilizumab, tofactinib, ustekinumab -live vaccines This list may not describe all possible interactions. Give your health care provider a list of all the medicines, herbs, non-prescription drugs, or dietary supplements you use. Also tell them if you smoke, drink alcohol, or use illegal drugs. Some items may interact with your medicine. What should I watch for while using this medicine? Your condition will be monitored carefully while you are receiving this medicine. Visit your doctor or health care professional for regular checks on your progress. You may need blood work done while you are taking this medicine. Before beginning therapy, your doctor may do a test to see if you have been exposed to tuberculosis. Call your doctor or health care professional for advice if you get a fever, chills or sore throat, or other symptoms of a cold or flu. Do not treat yourself. This drug decreases your body's ability to fight infections. Try to avoid being around people who are sick. This medicine may make the symptoms of heart failure worse in some patients. If you notice symptoms such as increased shortness of breath or swelling of the ankles or legs, contact your health care provider right away. If you are going to have surgery or dental work, tell your health care professional or dentist that you have received this medicine. If you take this medicine for plaque psoriasis, stay out of the sun. If you cannot avoid being in the sun, wear protective clothing and use sunscreen. Do not use sun lamps or tanning beds/booths. Talk to your doctor about your risk of cancer. You may be  more at risk for certain types of cancers if you take this medicine. What side effects may I notice from receiving this medicine? Side effects that you should report to your  doctor or health care professional as soon as possible: -allergic reactions like skin rash, itching or hives, swelling of the face, lips, or tongue -breathing problems -changes in vision -chest pain -fever or chills, usually related to the infusion -joint pain -pain, tingling, numbness in the hands or feet -redness, blistering, peeling or loosening of the skin, including inside the mouth -seizures -signs of infection - fever or chills, cough, sore throat, flu-like symptoms, pain or difficulty passing urine -signs and symptoms of liver injury like dark yellow or brown urine; general ill feeling; light-colored stools; loss of appetite; nausea; right upper belly pain; unusually weak or tired; yellowing of the eyes or skin -signs and symptoms of a stroke like changes in vision; confusion; trouble speaking or understanding; severe headaches; sudden numbness or weakness of the face, arm or leg; trouble walking; dizziness; loss of balance or coordination -swelling of the ankles, feet, or hands -swollen lymph nodes in the neck, underarm, or groin areas -unusual bleeding or bruising -unusually weak or tired Side effects that usually do not require medical attention (report to your doctor or health care professional if they continue or are bothersome): -headache -nausea -stomach pain -upset stomach This list may not describe all possible side effects. Call your doctor for medical advice about side effects. You may report side effects to FDA at 1-800-FDA-1088. Where should I keep my medicine? This drug is given in a hospital or clinic and will not be stored at home. NOTE: This sheet is a summary. It may not cover all possible information. If you have questions about this medicine, talk to your doctor, pharmacist, or health care provider.  2018 Elsevier/Gold Standard (2016-09-10 13:45:32)

## 2016-12-29 NOTE — Progress Notes (Signed)
Pt restarting Remicade.  Last dose was in August 2017. Pt states he didn't know when his appointments were, so he didn't come.  It was charted that pt was a no show/no call in October and December 2017. Pt states he didn't know about these appointments.  So pt starting over with remicade induction.  Pt was given d/c instructions on remicade and also given a printout of appointments through August of 2018.  Pt informed we do not have reminder calls for appointments, he would have to keep up with appointment dates and times.  Pt voiced understanding.  Pt did well with infusion.

## 2017-01-12 ENCOUNTER — Encounter (HOSPITAL_COMMUNITY)
Admission: RE | Admit: 2017-01-12 | Discharge: 2017-01-12 | Disposition: A | Discharge status: Home / Self Care | Payer: Medicaid Other | Source: Ambulatory Visit | Attending: Internal Medicine | Admitting: Internal Medicine

## 2017-01-12 ENCOUNTER — Encounter (HOSPITAL_COMMUNITY): Payer: Self-pay

## 2017-01-12 DIAGNOSIS — K50111 Crohn's disease of large intestine with rectal bleeding: Secondary | ICD-10-CM | POA: Diagnosis not present

## 2017-01-12 MED ORDER — SODIUM CHLORIDE 0.9 % IV SOLN
5.0000 mg/kg | Freq: Once | INTRAVENOUS | Status: AC
  Administered 2017-01-12: 300 mg via INTRAVENOUS
  Filled 2017-01-12: qty 30

## 2017-01-12 MED ORDER — SODIUM CHLORIDE 0.9 % IV SOLN
INTRAVENOUS | Status: DC
  Administered 2017-01-12: 11:00:00 via INTRAVENOUS

## 2017-01-12 MED ORDER — ACETAMINOPHEN 325 MG PO TABS
650.0000 mg | ORAL_TABLET | Freq: Every day | ORAL | Status: DC
  Administered 2017-01-12: 650 mg via ORAL
  Filled 2017-01-12: qty 2

## 2017-01-12 MED ORDER — DIPHENHYDRAMINE HCL 25 MG PO CAPS
50.0000 mg | ORAL_CAPSULE | Freq: Every day | ORAL | Status: DC
  Administered 2017-01-12: 50 mg via ORAL
  Filled 2017-01-12: qty 2

## 2017-02-09 ENCOUNTER — Encounter (HOSPITAL_COMMUNITY): Payer: Self-pay

## 2017-02-09 ENCOUNTER — Encounter (HOSPITAL_COMMUNITY)
Admission: RE | Admit: 2017-02-09 | Discharge: 2017-02-09 | Disposition: A | Discharge status: Home / Self Care | Payer: Medicaid Other | Source: Ambulatory Visit | Attending: Internal Medicine | Admitting: Internal Medicine

## 2017-02-09 DIAGNOSIS — K50111 Crohn's disease of large intestine with rectal bleeding: Secondary | ICD-10-CM | POA: Diagnosis present

## 2017-02-09 MED ORDER — DIPHENHYDRAMINE HCL 25 MG PO CAPS
50.0000 mg | ORAL_CAPSULE | Freq: Every day | ORAL | Status: DC
  Administered 2017-02-09: 50 mg via ORAL
  Filled 2017-02-09: qty 2

## 2017-02-09 MED ORDER — SODIUM CHLORIDE 0.9 % IV SOLN
5.0000 mg/kg | Freq: Once | INTRAVENOUS | Status: AC
  Administered 2017-02-09: 300 mg via INTRAVENOUS
  Filled 2017-02-09: qty 30

## 2017-02-09 MED ORDER — SODIUM CHLORIDE 0.9 % IV SOLN
INTRAVENOUS | Status: DC
  Administered 2017-02-09: 10:00:00 via INTRAVENOUS

## 2017-02-09 MED ORDER — ACETAMINOPHEN 325 MG PO TABS
650.0000 mg | ORAL_TABLET | Freq: Every day | ORAL | Status: DC
  Administered 2017-02-09: 650 mg via ORAL
  Filled 2017-02-09: qty 2

## 2017-02-09 NOTE — Discharge Instructions (Signed)
Infliximab injection What is this medicine? INFLIXIMAB (in [place] i mab) is used to treat Crohn's disease and ulcerative colitis. It is also used to treat ankylosing spondylitis, plaque psoriasis, and some forms of arthritis. This medicine may be used for other purposes; ask your health care provider or pharmacist if you have questions. COMMON BRAND NAME(S): INFLECTRA, Remicade, RENFLEXIS What should I tell my health care provider before I take this medicine? They need to know if you have any of these conditions: -cancer -current or past resident of [place] or [place] -diabetes -exposure to tuberculosis -Guillain-Barre syndrome -heart failure -hepatitis or liver disease -immune system problems -infection -lung or breathing disease, like COPD -multiple sclerosis -receiving phototherapy for the skin -seizure disorder -an unusual or allergic reaction to infliximab, mouse proteins, other medicines, foods, dyes, or preservatives -pregnant or trying to get pregnant -breast-feeding How should I use this medicine? This medicine is for injection into a vein. It is usually given by a health care professional in a hospital or clinic setting. A special MedGuide will be given to you by the pharmacist with each prescription and refill. Be sure to read this information carefully each time. Talk to your pediatrician regarding the use of this medicine in children. While this drug may be prescribed for children as young as 78 years of age for selected conditions, precautions do apply. Overdosage: If you think you have taken too much of this medicine contact a poison control center or emergency room at once. NOTE: This medicine is only for you. Do not share this medicine with others. What if I miss a dose? It is important not to miss your dose. Call your doctor or health care professional if you are unable to keep an appointment. What may interact with this medicine? Do not take this  medicine with any of the following medications: -biologic medicines such as abatacept, adalimumab, anakinra, certolizumab, etanercept, golimumab, rituximab, secukinumab, tocilizumab, tofactinib, ustekinumab -live vaccines This list may not describe all possible interactions. Give your health care provider a list of all the medicines, herbs, non-prescription drugs, or dietary supplements you use. Also tell them if you smoke, drink alcohol, or use illegal drugs. Some items may interact with your medicine. What should I watch for while using this medicine? Your condition will be monitored carefully while you are receiving this medicine. Visit your doctor or health care professional for regular checks on your progress. You may need blood work done while you are taking this medicine. Before beginning therapy, your doctor may do a test to see if you have been exposed to tuberculosis. Call your doctor or health care professional for advice if you get a fever, chills or sore throat, or other symptoms of a cold or flu. Do not treat yourself. This drug decreases your body's ability to fight infections. Try to avoid being around people who are sick. This medicine may make the symptoms of heart failure worse in some patients. If you notice symptoms such as increased shortness of breath or swelling of the ankles or legs, contact your health care provider right away. If you are going to have surgery or dental work, tell your health care professional or dentist that you have received this medicine. If you take this medicine for plaque psoriasis, stay out of the sun. If you cannot avoid being in the sun, wear protective clothing and use sunscreen. Do not use sun lamps or tanning beds/booths. Talk to your doctor about your risk of cancer. You may be  more at risk for certain types of cancers if you take this medicine. What side effects may I notice from receiving this medicine? Side effects that you should report to your  doctor or health care professional as soon as possible: -allergic reactions like skin rash, itching or hives, swelling of the face, lips, or tongue -breathing problems -changes in vision -chest pain -fever or chills, usually related to the infusion -joint pain -pain, tingling, numbness in the hands or feet -redness, blistering, peeling or loosening of the skin, including inside the mouth -seizures -signs of infection - fever or chills, cough, sore throat, flu-like symptoms, pain or difficulty passing urine -signs and symptoms of liver injury like dark yellow or brown urine; general ill feeling; light-colored stools; loss of appetite; nausea; right upper belly pain; unusually weak or tired; yellowing of the eyes or skin -signs and symptoms of a stroke like changes in vision; confusion; trouble speaking or understanding; severe headaches; sudden numbness or weakness of the face, arm or leg; trouble walking; dizziness; loss of balance or coordination -swelling of the ankles, feet, or hands -swollen lymph nodes in the neck, underarm, or groin areas -unusual bleeding or bruising -unusually weak or tired Side effects that usually do not require medical attention (report to your doctor or health care professional if they continue or are bothersome): -headache -nausea -stomach pain -upset stomach This list may not describe all possible side effects. Call your doctor for medical advice about side effects. You may report side effects to FDA at 1-800-FDA-1088. Where should I keep my medicine? This drug is given in a hospital or clinic and will not be stored at home. NOTE: This sheet is a summary. It may not cover all possible information. If you have questions about this medicine, talk to your doctor, pharmacist, or health care provider.  2018 Elsevier/Gold Standard (2016-09-10 13:45:32)

## 2017-03-05 IMAGING — CR DG ABDOMEN ACUTE W/ 1V CHEST
3 series · 3 of 3 positions shown · non-contrast
Comparison: None.

CLINICAL DATA: History of Crohn's disease with lower abdominal
pain, vomiting and fever.

EXAM:
DG ABDOMEN ACUTE W/ 1V CHEST

[w abdomen decub]
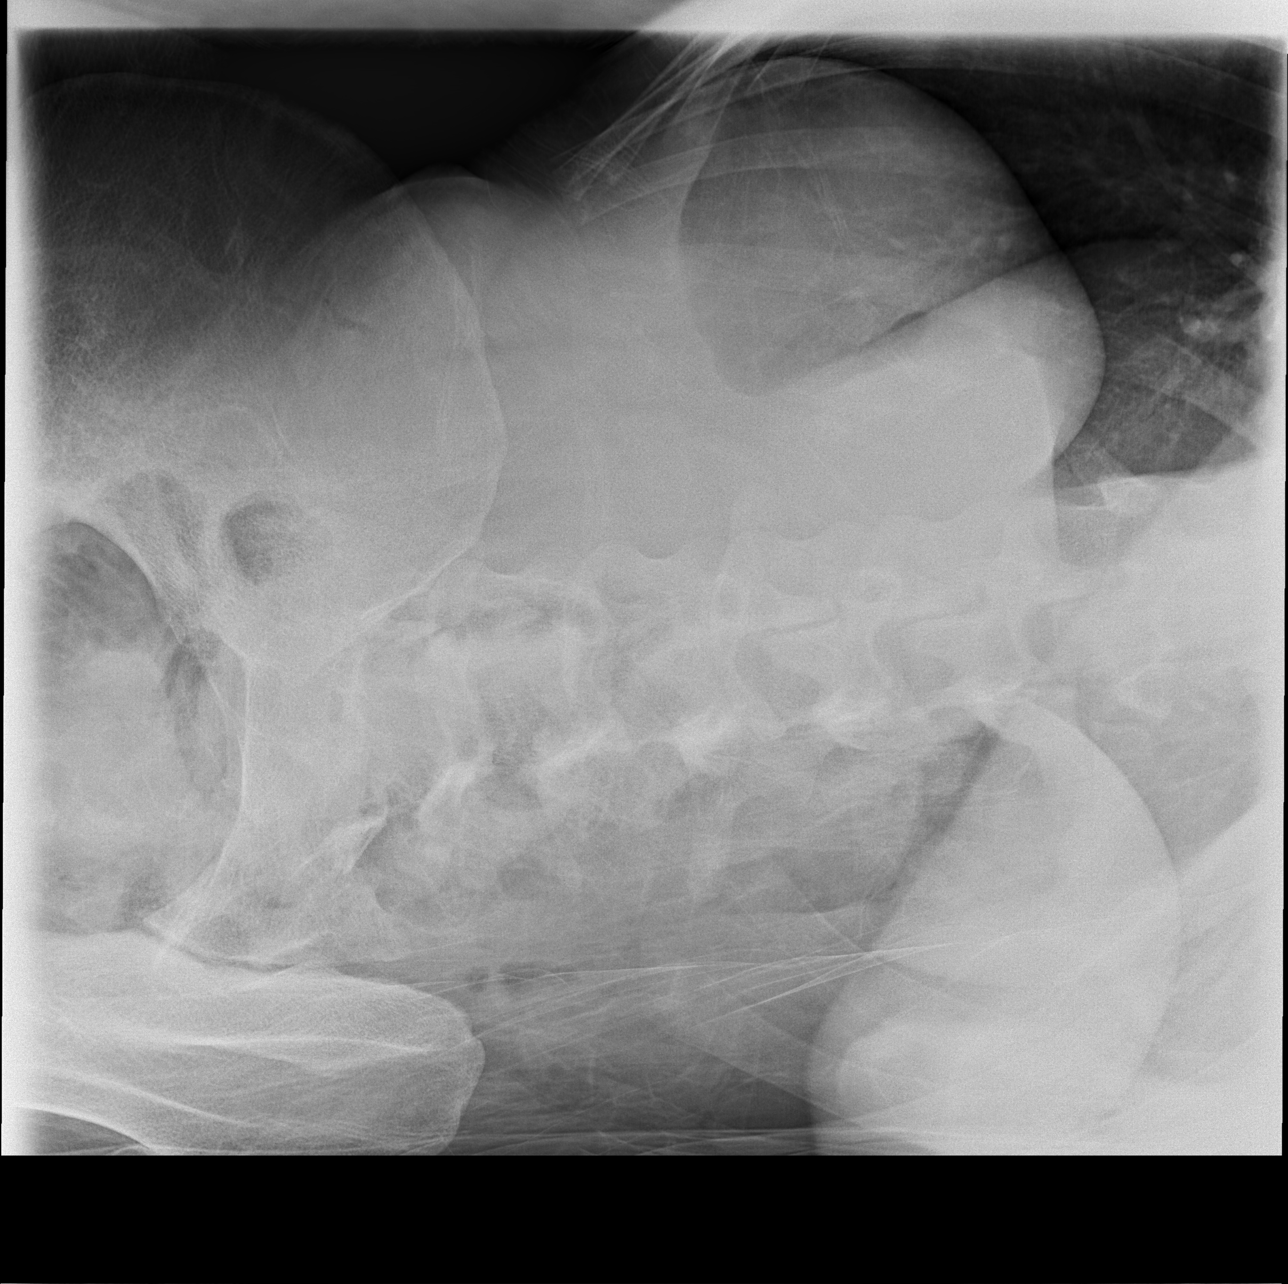

[x abdomen supine]
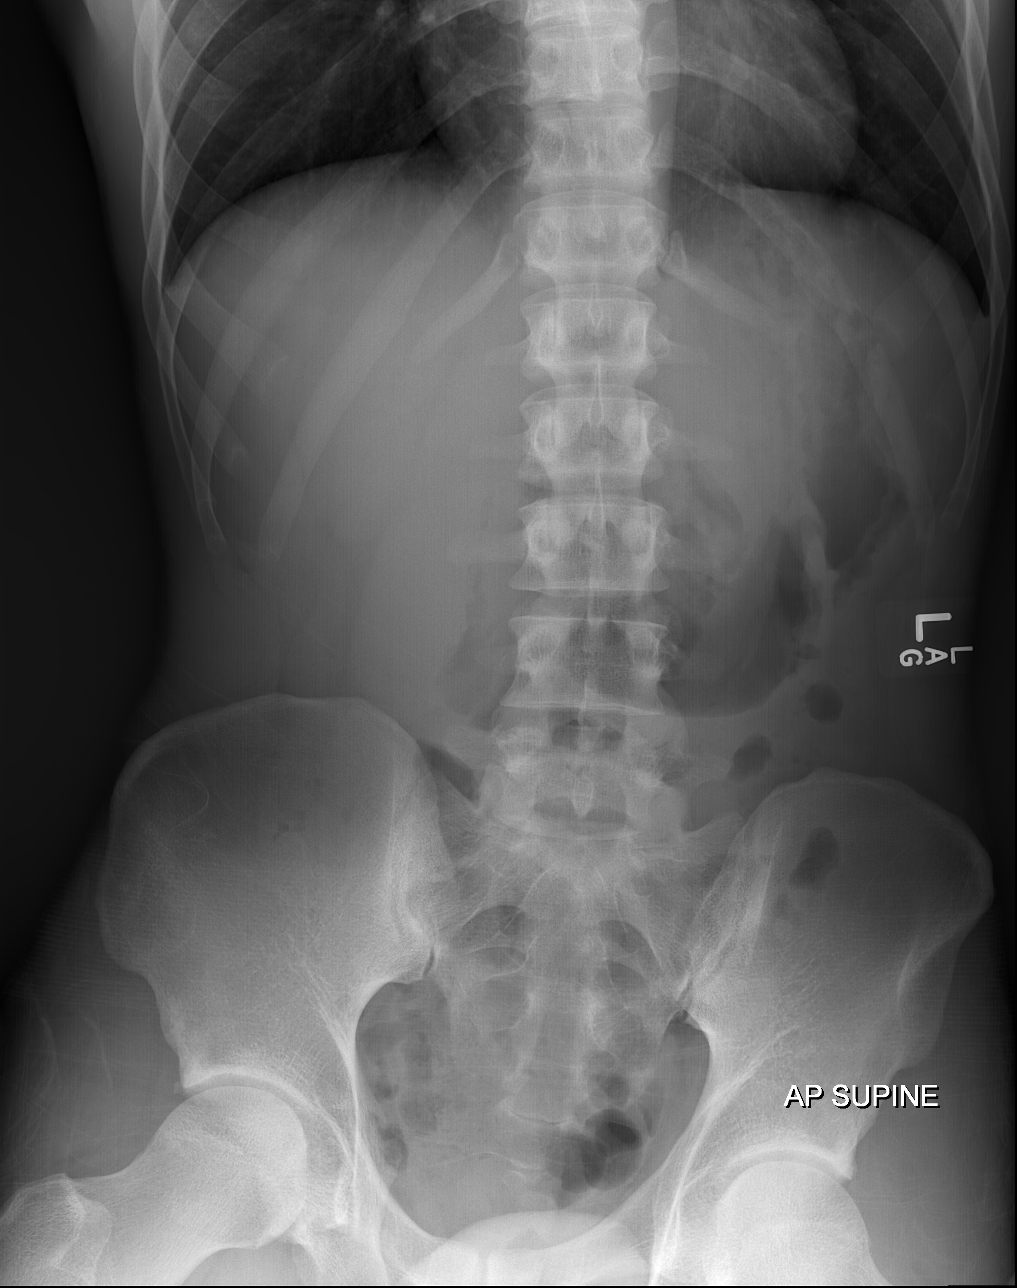

[x chest ap]
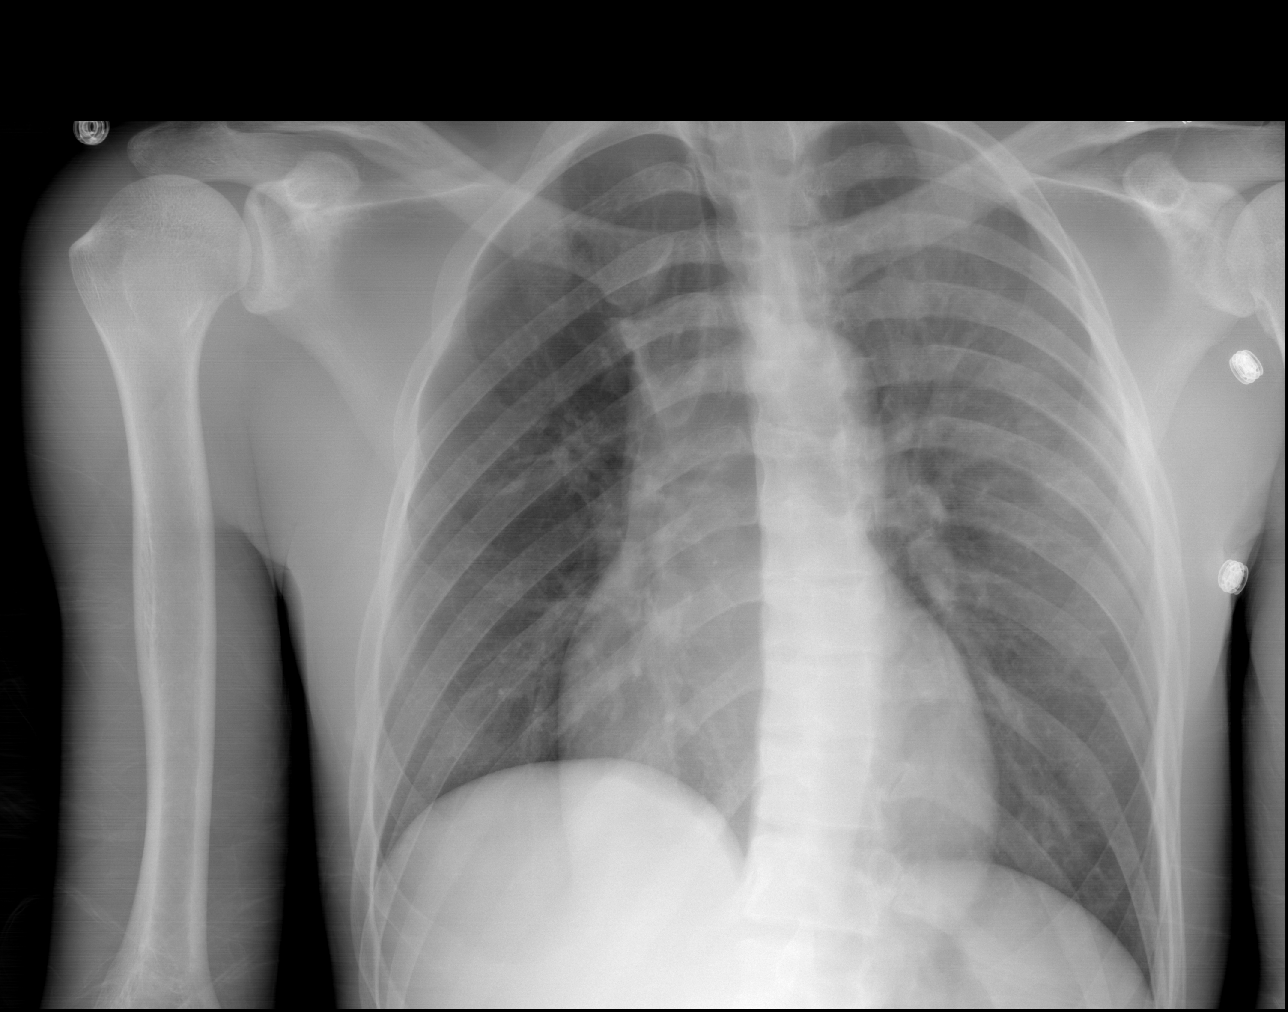

[3 of 3 positions shown; findings below may reference images not displayed]

FINDINGS: There is no evidence of dilated bowel loops or free intraperitoneal
air. No radiopaque calculi or other significant radiographic
abnormality is seen. Heart size and mediastinal contours are within
normal limits allowing for patient rotation. Both lungs are clear.
IMPRESSION: Negative abdominal radiographs.  No acute cardiopulmonary disease.

## 2017-03-25 ENCOUNTER — Encounter: Payer: Self-pay | Admitting: Internal Medicine

## 2017-04-01 ENCOUNTER — Other Ambulatory Visit: Payer: Self-pay | Admitting: *Deleted

## 2017-04-01 ENCOUNTER — Other Ambulatory Visit: Payer: Self-pay

## 2017-04-01 DIAGNOSIS — K50919 Crohn's disease, unspecified, with unspecified complications: Secondary | ICD-10-CM

## 2017-04-06 ENCOUNTER — Ambulatory Visit (HOSPITAL_COMMUNITY): Admission: RE | Admit: 2017-04-06 | Payer: Medicaid Other | Source: Ambulatory Visit

## 2017-04-09 ENCOUNTER — Ambulatory Visit (HOSPITAL_COMMUNITY)
Admission: RE | Admit: 2017-04-09 | Discharge: 2017-04-09 | Disposition: A | Discharge status: Home / Self Care | Payer: Medicaid Other | Source: Ambulatory Visit | Attending: Internal Medicine | Admitting: Internal Medicine

## 2017-04-09 ENCOUNTER — Encounter (HOSPITAL_COMMUNITY): Payer: Self-pay

## 2017-04-09 DIAGNOSIS — K50919 Crohn's disease, unspecified, with unspecified complications: Secondary | ICD-10-CM

## 2017-04-09 MED ORDER — DIPHENHYDRAMINE HCL 25 MG PO CAPS
50.0000 mg | ORAL_CAPSULE | Freq: Once | ORAL | Status: AC
  Administered 2017-04-09: 50 mg via ORAL
  Filled 2017-04-09: qty 2

## 2017-04-09 MED ORDER — SODIUM CHLORIDE 0.9 % IV SOLN
Freq: Once | INTRAVENOUS | Status: AC
  Administered 2017-04-09: 08:00:00 via INTRAVENOUS

## 2017-04-09 MED ORDER — ACETAMINOPHEN 325 MG PO TABS
650.0000 mg | ORAL_TABLET | Freq: Once | ORAL | Status: AC
  Administered 2017-04-09: 650 mg via ORAL
  Filled 2017-04-09: qty 2

## 2017-04-09 MED ORDER — SODIUM CHLORIDE 0.9 % IV SOLN
5.0000 mg/kg | Freq: Once | INTRAVENOUS | Status: AC
  Administered 2017-04-09: 300 mg via INTRAVENOUS
  Filled 2017-04-09: qty 30

## 2017-04-09 NOTE — Progress Notes (Signed)
Uneventful Remicade infusion today.  Pt given his next appointments for 06/05/17 at 1000 & 08/04/17 at 0800.

## 2017-04-30 ENCOUNTER — Emergency Department (HOSPITAL_COMMUNITY)
Admission: EM | Admit: 2017-04-30 | Discharge: 2017-04-30 | Disposition: A | Discharge status: Home / Self Care | Payer: Medicaid Other | Attending: Emergency Medicine | Admitting: Emergency Medicine

## 2017-04-30 ENCOUNTER — Encounter (HOSPITAL_COMMUNITY): Payer: Self-pay | Admitting: Family Medicine

## 2017-04-30 DIAGNOSIS — F17210 Nicotine dependence, cigarettes, uncomplicated: Secondary | ICD-10-CM | POA: Insufficient documentation

## 2017-04-30 DIAGNOSIS — R1084 Generalized abdominal pain: Secondary | ICD-10-CM | POA: Insufficient documentation

## 2017-04-30 DIAGNOSIS — Z79899 Other long term (current) drug therapy: Secondary | ICD-10-CM | POA: Diagnosis not present

## 2017-04-30 DIAGNOSIS — R1110 Vomiting, unspecified: Secondary | ICD-10-CM | POA: Diagnosis present

## 2017-04-30 LAB — COMPREHENSIVE METABOLIC PANEL
ALT: 17 U/L (ref 17–63)
AST: 31 U/L (ref 15–41)
Albumin: 4.4 g/dL (ref 3.5–5.0)
Alkaline Phosphatase: 73 U/L (ref 38–126)
Anion gap: 11 (ref 5–15)
BUN: 8 mg/dL (ref 6–20)
CALCIUM: 9.6 mg/dL (ref 8.9–10.3)
CHLORIDE: 102 mmol/L (ref 101–111)
CO2: 25 mmol/L (ref 22–32)
CREATININE: 0.98 mg/dL (ref 0.61–1.24)
GFR calc Af Amer: >60 mL/min (ref >60)
GFR calc non Af Amer: >60 mL/min (ref >60)
Glucose, Bld: 133 mg/dL — ABNORMAL HIGH (ref 65–99)
Potassium: 4.1 mmol/L (ref 3.5–5.1)
Sodium: 138 mmol/L (ref 135–145)
TOTAL PROTEIN: 7.2 g/dL (ref 6.5–8.1)
Total Bilirubin: 0.9 mg/dL (ref 0.3–1.2)

## 2017-04-30 LAB — CBC
HCT: 40.4 % (ref 39.0–52.0)
Hemoglobin: 14.8 g/dL (ref 13.0–17.0)
MCH: 32.1 pg (ref 26.0–34.0)
MCHC: 36.6 g/dL — ABNORMAL HIGH (ref 30.0–36.0)
MCV: 87.6 fL (ref 78.0–100.0)
PLATELETS: 169 10*3/uL (ref 150–400)
RBC: 4.61 MIL/uL (ref 4.22–5.81)
RDW: 12 % (ref 11.5–15.5)
WBC: 13.2 10*3/uL — AB (ref 4.0–10.5)

## 2017-04-30 LAB — LIPASE, BLOOD: LIPASE: 15 U/L (ref 11–51)

## 2017-04-30 MED ORDER — ONDANSETRON HCL 4 MG/2ML IJ SOLN
4.0000 mg | Freq: Once | INTRAMUSCULAR | Status: AC
  Administered 2017-04-30: 4 mg via INTRAVENOUS
  Filled 2017-04-30: qty 2

## 2017-04-30 MED ORDER — ONDANSETRON 4 MG PO TBDP: 4.0000 mg | ORAL_TABLET | Freq: Three times a day (TID) | ORAL | 0 refills | Status: DC | PRN

## 2017-04-30 MED ORDER — SODIUM CHLORIDE 0.9 % IV BOLUS (SEPSIS)
1000.0000 mL | Freq: Once | INTRAVENOUS | Status: AC
  Administered 2017-04-30: 1000 mL via INTRAVENOUS

## 2017-04-30 MED ORDER — DICYCLOMINE HCL 20 MG PO TABS: 20.0000 mg | ORAL_TABLET | Freq: Two times a day (BID) | ORAL | 0 refills | Status: DC

## 2017-04-30 MED ORDER — ONDANSETRON 4 MG PO TBDP
4.0000 mg | ORAL_TABLET | Freq: Once | ORAL | Status: AC | PRN
  Administered 2017-04-30: 4 mg via ORAL
  Filled 2017-04-30: qty 1

## 2017-04-30 MED ORDER — HYDROMORPHONE HCL 1 MG/ML IJ SOLN
0.5000 mg | Freq: Once | INTRAMUSCULAR | Status: AC
  Administered 2017-04-30: 0.5 mg via INTRAVENOUS
  Filled 2017-04-30: qty 1

## 2017-04-30 NOTE — ED Triage Notes (Signed)
Patient is reporting he is having abd pain with nausea and vomiting started at 1:00am .Patient relates his symptoms to his medical history of IBS.

## 2017-04-30 NOTE — Discharge Instructions (Signed)
Please read and follow all provided instructions.  Your diagnoses today include:  1. Generalized abdominal pain     Tests performed today include:  Blood counts and electrolytes  Blood tests to check liver and kidney function  Blood tests to check pancreas function  Urine test to look for infection  Vital signs. See below for your results today.   Medications prescribed:   Zofran (ondansetron) - for nausea and vomiting   Bentyl - medication for intestinal cramps and spasms  Take any prescribed medications only as directed.  Home care instructions:   Follow any educational materials contained in this packet.  Follow-up instructions: Please follow-up with your primary care provider in the next 3 days for further evaluation of your symptoms.    Return instructions:  SEEK IMMEDIATE MEDICAL ATTENTION IF:  The pain does not go away or becomes severe   A temperature above 101F develops   Repeated vomiting occurs (multiple episodes)   The pain becomes localized to portions of the abdomen. The right side could possibly be appendicitis. In an adult, the left lower portion of the abdomen could be colitis or diverticulitis.   Blood is being passed in stools or vomit (bright red or black tarry stools)   You develop chest pain, difficulty breathing, dizziness or fainting, or become confused, poorly responsive, or inconsolable (young children)  If you have any other emergent concerns regarding your health  Additional Information: Abdominal (belly) pain can be caused by many things. Your caregiver performed an examination and possibly ordered blood/urine tests and imaging (CT scan, x-rays, ultrasound). Many cases can be observed and treated at home after initial evaluation in the emergency department. Even though you are being discharged home, abdominal pain can be unpredictable. Therefore, you need a repeated exam if your pain does not resolve, returns, or worsens. Most patients  with abdominal pain don't have to be admitted to the hospital or have surgery, but serious problems like appendicitis and gallbladder attacks can start out as nonspecific pain. Many abdominal conditions cannot be diagnosed in one visit, so follow-up evaluations are very important.  Your vital signs today were: BP 114/60 (BP Location: Right Arm)    Pulse (!) 51    Temp 98.1 F (36.7 C) (Oral)    Resp 17    SpO2 96%  If your blood pressure (bp) was elevated above 135/85 this visit, please have this repeated by your doctor within one month. --------------

## 2017-04-30 NOTE — ED Provider Notes (Signed)
[facility] DEPT Provider Note   CSN: [id number] Arrival date & time: 04/30/17  0331     History   Chief Complaint Chief Complaint  Patient presents with  . Emesis    HPI Philip Joseph is a 26 y.o. male.  Patient with h/o Crohn's disease, on current Remicade therapy -- presents with c/o abdominal pain and vomiting starting acutely at 1am. Patient states that his symptoms are similar to previous pain and pattern, which he relates to Crohn's disease flare. Patient has had multiple episodes of nonbloody, nonbilious vomiting. He tried to take "pain reliever" with water with vomiting. No other treatments prior to arrival. No fevers, chest pain, shortness of breath. Patient denies diarrhea or urinary symptoms. Pain is worse in the lower abdomen. No suspicious food or water intakes. Patient denies history of surgery.  The course is constant. Aggravating factors: none. Alleviating factors: none.        Past Medical History:  Diagnosis Date  . ADHD (attention deficit hyperactivity disorder)   . Crohn's ileitis ([place]) 12/19/2013  . IBS (irritable bowel syndrome)   . Long term current use of systemic steroids 11/27/2015  . 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 Unspecified vitamin D deficiency 12/21/2013    Patient Active Problem List   Diagnosis Date Noted  . Long-term use of immunosuppressant medication- Remicade 12/09/2016  . Long term current use of systemic steroids 11/27/2015  . 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 Smoker 11/29/2014  . Vitamin D deficiency 12/21/2013  . 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 Attention deficit hyperactivity disorder (ADHD) 04/22/2010    Past Surgical History:  Procedure Laterality Date  . COLONOSCOPY N/A 12/20/2013   Procedure: COLONOSCOPY;  Surgeon: [name], MD;  Location: [facility] ENDOSCOPY;  Service: Endoscopy;  Laterality: N/A;  . COLONOSCOPY    . WRIST  FRACTURE SURGERY         Home Medications    Prior to Admission medications   Medication Sig Start Date End Date Taking? Authorizing Provider  dicyclomine (BENTYL) 20 MG tablet Take 1 tablet (20 mg total) by mouth every 6 (six) hours as needed for spasms (abdominal pain). 12/09/16   [name], MD  InFLIXimab (REMICADE IV) Inject 300 mg into the vein every 8 (eight) weeks. Restarting remicade, 1st dose 12/29/16.  Will be doing induction: week 2- 01/12/17, week 4:02/09/17, week 8:04/06/17    [provider]    Family History Family History  Problem Relation Age of Onset  . Prostate cancer Father     Social History Social History  Substance Use Topics  . Smoking status: Current Some Day Smoker    Types: Cigarettes  . Smokeless tobacco: Never Used  . Alcohol use No     Allergies   Penicillins   Review of Systems Review of Systems  Constitutional: Negative for fever.  HENT: Negative for rhinorrhea and sore throat.   Eyes: Negative for redness.  Respiratory: Negative for cough.   Cardiovascular: Negative for chest pain.  Gastrointestinal: Positive for abdominal pain and vomiting. Negative for blood in stool, diarrhea and nausea.  Genitourinary: Negative for dysuria.  Musculoskeletal: Negative for myalgias.  Skin: Negative for rash.  Neurological: Negative for headaches.     Physical Exam Updated Vital Signs BP 123/81 (BP Location: Left Arm)   Pulse (!) 56   Resp 20   SpO2 100%   Physical Exam  Constitutional: He appears well-developed and well-nourished.  HENT:  Head: Normocephalic and atraumatic.  Eyes: Conjunctivae are normal. Right eye exhibits no discharge. Left eye exhibits no discharge.  Neck: Normal range of motion. Neck supple.  Cardiovascular: Normal rate, regular rhythm and normal heart sounds.   Pulmonary/Chest: Effort normal and breath sounds normal. No respiratory distress. He has no wheezes. He has no rales.  Abdominal: Soft. There is  tenderness (Generalized tenderness to palpation, mild). There is no rebound and no guarding.  Neurological: He is alert.  Skin: Skin is warm and dry.  Psychiatric: He has a normal mood and affect.  Nursing note and vitals reviewed.    ED Treatments / Results  Labs (all labs ordered are listed, but only abnormal results are displayed) Labs Reviewed  COMPREHENSIVE METABOLIC PANEL - Abnormal; Notable for the following:       Result Value   Glucose, Bld 133 (*)    All other components within normal limits  CBC - Abnormal; Notable for the following:    WBC 13.2 (*)    MCHC 36.6 (*)    All other components within normal limits  LIPASE, BLOOD  URINALYSIS, ROUTINE W REFLEX MICROSCOPIC    Procedures Procedures (including critical care time)  Medications Ordered in ED Medications  ondansetron (ZOFRAN-ODT) disintegrating tablet 4 mg (4 mg Oral Given 04/30/17 0404)  ondansetron (ZOFRAN) injection 4 mg (4 mg Intravenous Given 04/30/17 0811)  HYDROmorphone (DILAUDID) injection 0.5 mg (0.5 mg Intravenous Given 04/30/17 0811)  sodium chloride 0.9 % bolus 1,000 mL (0 mLs Intravenous Stopped 04/30/17 1020)     Initial Impression / Assessment and Plan / ED Course  I have reviewed the triage vital signs and the nursing notes.  Pertinent labs & imaging results that were available during my care of the patient were reviewed by me and considered in my medical decision making (see chart for details).     Patient seen and examined. Work-up initiated. Medications ordered.   Vital signs reviewed and are as follows: BP 123/81 (BP Location: Left Arm)   Pulse (!) 56   Resp 20   SpO2 100%   10:04 AM Patient doing well. States pain is improved. Exam is unchanged with mild abd tenderness, non-focal, soft abd. Will need oral challenge.   10:52 AM Patient tolerating fluids. Will d/c with zofran and bentyl.   The patient was urged to return to the Emergency Department immediately with worsening of current  symptoms, worsening abdominal pain, persistent vomiting, blood noted in stools, fever, or any other concerns. The patient verbalized understanding.    Final Clinical Impressions(s) / ED Diagnoses   Final diagnoses:  Generalized abdominal pain   Patient with abdominal pain, vomiting now controlled. Vitals are stable, no fever. Labs with leukocytosis. He is on immunosuppressive agents which can make evaluation difficult but at this point given recent onset (hrs of symptoms), no fever, no focality -- imaging not felt indicated unless symptoms progress.No signs of dehydration, patient is tolerating PO's. Lungs are clear and no signs suggestive of PNA. Low concern for appendicitis, cholecystitis, pancreatitis, ruptured viscus, UTI, kidney stone, aortic dissection, aortic aneurysm or other emergent abdominal etiology. Supportive therapy indicated with return if symptoms worsen.     New Prescriptions New Prescriptions   DICYCLOMINE (BENTYL) 20 MG TABLET    Take 1 tablet (20 mg total) by mouth 2 (two) times daily.   ONDANSETRON (ZOFRAN ODT) 4 MG DISINTEGRATING TABLET    Take 1 tablet (4 mg total) by mouth every 8 (eight) hours as needed  for nausea or vomiting.     Carlisle Cater, PA-C 04/30/17 [place], [place], MD 04/30/17 2218

## 2017-04-30 NOTE — ED Notes (Addendum)
Pt able to sip water. Resting comfortably in room. PA aware.

## 2017-04-30 NOTE — ED Notes (Signed)
Mr Curci is displaying resistance to care by refusing to remove street clothing IE outer jacket to allow IV site to be obtained. Multiple requests greater that 6 times he was asked to remove his jacket  So pain medications could be adminstered via IV start and althought not verbally refusing he would not remove his coat so an IV site could be established.

## 2017-04-30 NOTE — ED Notes (Signed)
Patient was alert, oriented and stable upon discharge. RN went over AVS and patient had no further questions.  Patient was instructed not to drive or operate heavy machinery on narcotic pain medication.

## 2017-05-01 ENCOUNTER — Telehealth: Payer: Self-pay

## 2017-05-01 NOTE — Telephone Encounter (Signed)
-----   Message from [name], MD sent at 05/01/2017 10:56 AM EDT ----- Regarding: f/u He was in ED yesterday w/ pain and vomiting  Please check on him and set up a f/u visit - last seen in April so about time - esp if was sick  Not urgent likely but let's get him on the books with me next available and if he is continuing to have problems let me know and I may advise other testing

## 2017-05-01 NOTE — Telephone Encounter (Signed)
Patient scheduled for ov 06/29/17.  Reminder letter mailed to him as well

## 2017-05-29 ENCOUNTER — Other Ambulatory Visit: Payer: Self-pay

## 2017-05-29 DIAGNOSIS — K50119 Crohn's disease of large intestine with unspecified complications: Secondary | ICD-10-CM

## 2017-06-01 ENCOUNTER — Encounter (HOSPITAL_COMMUNITY): Payer: Medicaid Other

## 2017-06-04 ENCOUNTER — Other Ambulatory Visit: Payer: Self-pay

## 2017-06-04 DIAGNOSIS — K50119 Crohn's disease of large intestine with unspecified complications: Secondary | ICD-10-CM

## 2017-06-05 ENCOUNTER — Encounter (HOSPITAL_COMMUNITY): Payer: Self-pay

## 2017-06-05 ENCOUNTER — Encounter (HOSPITAL_COMMUNITY)
Admission: RE | Admit: 2017-06-05 | Discharge: 2017-06-05 | Disposition: A | Discharge status: Home / Self Care | Payer: Medicaid Other | Source: Ambulatory Visit | Attending: Internal Medicine | Admitting: Internal Medicine

## 2017-06-05 DIAGNOSIS — K50119 Crohn's disease of large intestine with unspecified complications: Secondary | ICD-10-CM | POA: Diagnosis present

## 2017-06-05 MED ORDER — SODIUM CHLORIDE 0.9 % IV SOLN
INTRAVENOUS | Status: DC
  Administered 2017-06-05: 11:00:00 via INTRAVENOUS

## 2017-06-05 MED ORDER — ACETAMINOPHEN 325 MG PO TABS
650.0000 mg | ORAL_TABLET | Freq: Every day | ORAL | Status: DC
  Administered 2017-06-05: 650 mg via ORAL
  Filled 2017-06-05: qty 2

## 2017-06-05 MED ORDER — INFLIXIMAB 100 MG IV SOLR
5.0000 mg/kg | INTRAVENOUS | Status: DC
  Administered 2017-06-05: 300 mg via INTRAVENOUS
  Filled 2017-06-05: qty 30

## 2017-06-05 MED ORDER — DIPHENHYDRAMINE HCL 25 MG PO CAPS
50.0000 mg | ORAL_CAPSULE | Freq: Every day | ORAL | Status: DC
  Administered 2017-06-05: 50 mg via ORAL
  Filled 2017-06-05: qty 2

## 2017-06-05 NOTE — Discharge Instructions (Signed)
Remicade Infliximab injection What is this medicine? INFLIXIMAB (in [place] i mab) is used to treat Crohn's disease and ulcerative colitis. It is also used to treat ankylosing spondylitis, plaque psoriasis, and some forms of arthritis. This medicine may be used for other purposes; ask your health care provider or pharmacist if you have questions. COMMON BRAND NAME(S): INFLECTRA, Remicade, RENFLEXIS What should I tell my health care provider before I take this medicine? They need to know if you have any of these conditions: -cancer -current or past resident of [place] or [place] -diabetes -exposure to tuberculosis -Guillain-Barre syndrome -heart failure -hepatitis or liver disease -immune system problems -infection -lung or breathing disease, like COPD -multiple sclerosis -receiving phototherapy for the skin -seizure disorder -an unusual or allergic reaction to infliximab, mouse proteins, other medicines, foods, dyes, or preservatives -pregnant or trying to get pregnant -breast-feeding How should I use this medicine? This medicine is for injection into a vein. It is usually given by a health care professional in a hospital or clinic setting. A special MedGuide will be given to you by the pharmacist with each prescription and refill. Be sure to read this information carefully each time. Talk to your pediatrician regarding the use of this medicine in children. While this drug may be prescribed for children as young as 75 years of age for selected conditions, precautions do apply. Overdosage: If you think you have taken too much of this medicine contact a poison control center or emergency room at once. NOTE: This medicine is only for you. Do not share this medicine with others. What if I miss a dose? It is important not to miss your dose. Call your doctor or health care professional if you are unable to keep an appointment. What may interact with this medicine? Do not  take this medicine with any of the following medications: -biologic medicines such as abatacept, adalimumab, anakinra, certolizumab, etanercept, golimumab, rituximab, secukinumab, tocilizumab, tofactinib, ustekinumab -live vaccines This list may not describe all possible interactions. Give your health care provider a list of all the medicines, herbs, non-prescription drugs, or dietary supplements you use. Also tell them if you smoke, drink alcohol, or use illegal drugs. Some items may interact with your medicine. What should I watch for while using this medicine? Your condition will be monitored carefully while you are receiving this medicine. Visit your doctor or health care professional for regular checks on your progress. You may need blood work done while you are taking this medicine. Before beginning therapy, your doctor may do a test to see if you have been exposed to tuberculosis. Call your doctor or health care professional for advice if you get a fever, chills or sore throat, or other symptoms of a cold or flu. Do not treat yourself. This drug decreases your body's ability to fight infections. Try to avoid being around people who are sick. This medicine may make the symptoms of heart failure worse in some patients. If you notice symptoms such as increased shortness of breath or swelling of the ankles or legs, contact your health care provider right away. If you are going to have surgery or dental work, tell your health care professional or dentist that you have received this medicine. If you take this medicine for plaque psoriasis, stay out of the sun. If you cannot avoid being in the sun, wear protective clothing and use sunscreen. Do not use sun lamps or tanning beds/booths. Talk to your doctor about your risk of cancer. You  may be more at risk for certain types of cancers if you take this medicine. What side effects may I notice from receiving this medicine? Side effects that you should  report to your doctor or health care professional as soon as possible: -allergic reactions like skin rash, itching or hives, swelling of the face, lips, or tongue -breathing problems -changes in vision -chest pain -fever or chills, usually related to the infusion -joint pain -pain, tingling, numbness in the hands or feet -redness, blistering, peeling or loosening of the skin, including inside the mouth -seizures -signs of infection - fever or chills, cough, sore throat, flu-like symptoms, pain or difficulty passing urine -signs and symptoms of liver injury like dark yellow or brown urine; general ill feeling; light-colored stools; loss of appetite; nausea; right upper belly pain; unusually weak or tired; yellowing of the eyes or skin -signs and symptoms of a stroke like changes in vision; confusion; trouble speaking or understanding; severe headaches; sudden numbness or weakness of the face, arm or leg; trouble walking; dizziness; loss of balance or coordination -swelling of the ankles, feet, or hands -swollen lymph nodes in the neck, underarm, or groin areas -unusual bleeding or bruising -unusually weak or tired Side effects that usually do not require medical attention (report to your doctor or health care professional if they continue or are bothersome): -headache -nausea -stomach pain -upset stomach This list may not describe all possible side effects. Call your doctor for medical advice about side effects. You may report side effects to FDA at 1-800-FDA-1088. Where should I keep my medicine? This drug is given in a hospital or clinic and will not be stored at home. NOTE: This sheet is a summary. It may not cover all possible information. If you have questions about this medicine, talk to your doctor, pharmacist, or health care provider.  2018 Elsevier/Gold Standard (2016-09-10 13:45:32)

## 2017-06-29 ENCOUNTER — Ambulatory Visit: Payer: Medicaid Other | Admitting: Internal Medicine

## 2017-07-27 ENCOUNTER — Encounter (HOSPITAL_COMMUNITY): Payer: Medicaid Other

## 2017-08-04 ENCOUNTER — Encounter (HOSPITAL_COMMUNITY): Payer: Medicaid Other

## 2017-09-22 ENCOUNTER — Telehealth: Payer: Self-pay

## 2017-09-22 NOTE — Telephone Encounter (Signed)
-----   Message from [name], MD sent at 09/22/2017 10:02 AM EST ----- Regarding: RE: Request for New Remicade Orders Contact: [phone] I think he should be seen before he gets another infusion  Could need re-induction   ----- Message ----- From: Marlon Pel, RN Sent: 09/22/2017   9:56 AM To: [name], MD Subject: FW: Request for New Remicade Orders            Patient's last Remicade infusion was 06/05/18.  Short Stay allowed him to schedule one for 09/29/17.  That is 16 weeks.  He no showed the office visit with you on 07/06/18 after he was seen in the ED.  Should he be allowed to have a Remicade without being seen? We haven't had any contact in some time. ----- Message ----- From: Fransico Setters, NT Sent: 09/22/2017   8:10 AM To: Marlon Pel, RN Subject: Request for New Remicade Orders                Hi Makyah Lavigne!!! Happy Tuesday!! I am sending this message to request new Remicade orders for Mr. Magallanes's next infusion please and thank you ma'am as always!!   Karena Addison

## 2017-09-22 NOTE — Telephone Encounter (Signed)
Philip Joseph at [employer] notified.   Patient notified appt scheduled for 09/24/17 11:00 to discuss with Dr. Carlean Purl

## 2017-09-24 ENCOUNTER — Encounter (INDEPENDENT_AMBULATORY_CARE_PROVIDER_SITE_OTHER): Payer: Self-pay

## 2017-09-24 ENCOUNTER — Other Ambulatory Visit: Payer: Medicaid Other

## 2017-09-24 ENCOUNTER — Ambulatory Visit (INDEPENDENT_AMBULATORY_CARE_PROVIDER_SITE_OTHER): Payer: Medicaid Other | Admitting: Internal Medicine

## 2017-09-24 ENCOUNTER — Encounter: Payer: Self-pay | Admitting: Internal Medicine

## 2017-09-24 DIAGNOSIS — Z7960 Long term (current) use of unspecified immunomodulators and immunosuppressants: Secondary | ICD-10-CM

## 2017-09-24 DIAGNOSIS — K5000 Crohn's disease of small intestine without complications: Secondary | ICD-10-CM | POA: Diagnosis not present

## 2017-09-24 DIAGNOSIS — Z79899 Other long term (current) drug therapy: Secondary | ICD-10-CM | POA: Diagnosis not present

## 2017-09-24 DIAGNOSIS — F17200 Nicotine dependence, unspecified, uncomplicated: Secondary | ICD-10-CM | POA: Diagnosis not present

## 2017-09-24 NOTE — Patient Instructions (Addendum)
Your physician has requested that you go to the basement for the  lab work before leaving today.   We will get you back on the books for your remicade infusions.    I appreciate the opportunity to care for you. Silvano Rusk, MD, [facility]

## 2017-09-24 NOTE — Progress Notes (Signed)
Philip Joseph 27 y.o. [birth year] [id number]  Assessment & Plan:  Crohn's ileitis Seems to be doing well Restart Remicade - check quantiferon RTC 6 mos approx   Long-term use of immunosuppressant medication- Remicade Declined influenza vaccine  Smoker Reducing he says counseled to quit   Cc:Harvie Junior, MD    Subjective:   Chief Complaint: f/u Crohn's disease  HPI He denies diarrhea or sig abdominal pain. Last Remicade in October - son was sick and did and cancelled dec infusion. Last Quantiferon neg 11/2016.  Still smoking cigarettes and marijuana but less.  Livving w/ his son and his mother. On disability he says Allergies  Allergen Reactions  . Penicillins Rash    Has patient had a PCN reaction causing immediate rash, facial/tongue/throat swelling, SOB or lightheadedness with hypotension: No Has patient had a PCN reaction causing severe rash involving mucus membranes or skin necrosis: No Has patient had a PCN reaction that required hospitalization No Has patient had a PCN reaction occurring within the last 10 years: No If all of the above answers are "NO", then may proceed with Cephalosporin use.    Current Meds  Medication Sig  . dicyclomine (BENTYL) 20 MG tablet Take 1 tablet (20 mg total) by mouth 2 (two) times daily.  . InFLIXimab (REMICADE IV) Inject 300 mg into the vein every 8 (eight) weeks. Restarting remicade, 1st dose 12/29/16.  Will be doing induction: week 2- 01/12/17, week 4:02/09/17, week 8:04/06/17  . ondansetron (ZOFRAN ODT) 4 MG disintegrating tablet Take 1 tablet (4 mg total) by mouth every 8 (eight) hours as needed for nausea or vomiting.   Past Medical History:  Diagnosis Date  . ADHD (attention deficit hyperactivity disorder)   . Crohn's ileitis ([place]) 12/19/2013  . IBS (irritable bowel syndrome)   . Long term current use of systemic steroids 11/27/2015  . 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 Unspecified vitamin D  deficiency 12/21/2013   Past Surgical History:  Procedure Laterality Date  . COLONOSCOPY N/A 12/20/2013   Procedure: COLONOSCOPY;  Surgeon: [name], MD;  Location: [facility];  Service: Endoscopy;  Laterality: N/A;  . COLONOSCOPY    . WRIST FRACTURE SURGERY     Social History   Social History Narrative   Single, 1 son born 2018.   Lives w/ baby's mother and son   + Smoker of tobacco and marijuana   Disability ?   family history includes Prostate cancer in his father.   Review of Systems Low back pain - sore after playing basketball for a few days  Objective:   Physical Exam @BP  102/74   Pulse (!) 58   Ht 5' 8"  (1.727 m)   Wt 138 lb 2 oz (62.7 kg)   BMI 21.00 kg/m @  General:  NAD Eyes:   anicteric Lungs:  clear Heart::  S1S2 no rubs, murmurs or gallops Abdomen:  soft and nontender, BS+ Ext:   no edema, cyanosis or clubbing    Data Reviewed:   Lab Results  Component Value Date   WBC 13.2 (H) 04/30/2017   HGB 14.8 04/30/2017   HCT 40.4 04/30/2017   MCV 87.6 04/30/2017   PLT 169 04/30/2017     Chemistry      Component Value Date/Time   NA 138 04/30/2017 0742   K 4.1 04/30/2017 0742   CL 102 04/30/2017 0742   CO2 25 04/30/2017 0742   BUN 8 04/30/2017 0742   CREATININE 0.98 04/30/2017 0742  Component Value Date/Time   CALCIUM 9.6 04/30/2017 0742   ALKPHOS 73 04/30/2017 0742   AST 31 04/30/2017 0742   ALT 17 04/30/2017 0742   BILITOT 0.9 04/30/2017 0742

## 2017-09-24 NOTE — Assessment & Plan Note (Addendum)
Seems to be doing well Restart Remicade - check quantiferon RTC 6 mos approx

## 2017-09-24 NOTE — Assessment & Plan Note (Signed)
Declined influenza vaccine

## 2017-09-24 NOTE — Assessment & Plan Note (Signed)
Reducing he says counseled to quit

## 2017-09-25 ENCOUNTER — Other Ambulatory Visit: Payer: Self-pay

## 2017-09-25 ENCOUNTER — Telehealth: Payer: Self-pay

## 2017-09-25 DIAGNOSIS — K50919 Crohn's disease, unspecified, with unspecified complications: Secondary | ICD-10-CM

## 2017-09-25 NOTE — Telephone Encounter (Signed)
Patient has been rescheduled for 10/13/17 11:00.  They will not schedule more than one infusion at a time for him due to his excessive no show history.  Patient's telephone is disconnected.  I spoke with his grandfather Mortimer Fries.  He will give him the information.  I will also mail him a letter with the appt information.

## 2017-09-25 NOTE — Telephone Encounter (Signed)
-----   Message from [name], MD sent at 09/24/2017  1:37 PM EST ----- Regarding: OK to schedule Remicade His quantiferon was drawn  OK to restart the REmicade at prior dose and frequency and order x 2 more

## 2017-09-26 LAB — QUANTIFERON-TB GOLD PLUS
Mitogen-NIL: >10 IU/mL
NIL: 0.05 [IU]/mL
QUANTIFERON-TB GOLD PLUS: NEGATIVE

## 2017-09-29 ENCOUNTER — Encounter (HOSPITAL_COMMUNITY): Payer: Medicaid Other

## 2017-09-30 NOTE — Progress Notes (Signed)
TB test is negative Needs a reminder message for 1 year to repeat

## 2017-10-13 ENCOUNTER — Encounter (HOSPITAL_COMMUNITY)
Admission: RE | Admit: 2017-10-13 | Discharge: 2017-10-13 | Disposition: A | Discharge status: Home / Self Care | Payer: Medicaid Other | Source: Ambulatory Visit | Attending: Internal Medicine | Admitting: Internal Medicine

## 2017-10-13 ENCOUNTER — Encounter (HOSPITAL_COMMUNITY): Payer: Self-pay

## 2017-10-13 DIAGNOSIS — K50919 Crohn's disease, unspecified, with unspecified complications: Secondary | ICD-10-CM

## 2017-10-13 MED ORDER — DIPHENHYDRAMINE HCL 25 MG PO TABS
50.0000 mg | ORAL_TABLET | Freq: Every day | ORAL | Status: DC
  Administered 2017-10-13: 50 mg via ORAL
  Filled 2017-10-13 (×4): qty 2

## 2017-10-13 MED ORDER — SODIUM CHLORIDE 0.9 % IV SOLN
5.0000 mg/kg | INTRAVENOUS | Status: DC
  Administered 2017-10-13: 300 mg via INTRAVENOUS
  Filled 2017-10-13: qty 30

## 2017-10-13 MED ORDER — SODIUM CHLORIDE 0.9 % IV SOLN
INTRAVENOUS | Status: DC
  Administered 2017-10-13: 11:00:00 via INTRAVENOUS

## 2017-10-13 MED ORDER — ACETAMINOPHEN 325 MG PO TABS
650.0000 mg | ORAL_TABLET | Freq: Every day | ORAL | Status: DC
  Administered 2017-10-13: 650 mg via ORAL
  Filled 2017-10-13: qty 2

## 2017-10-13 NOTE — Discharge Instructions (Signed)
Infliximab injection What is this medicine? INFLIXIMAB (in [place] i mab) is used to treat Crohn's disease and ulcerative colitis. It is also used to treat ankylosing spondylitis, plaque psoriasis, and some forms of arthritis. This medicine may be used for other purposes; ask your health care provider or pharmacist if you have questions. COMMON BRAND NAME(S): INFLECTRA, Remicade, RENFLEXIS What should I tell my health care provider before I take this medicine? They need to know if you have any of these conditions: -cancer -current or past resident of [place] or [place] -diabetes -exposure to tuberculosis -Guillain-Barre syndrome -heart failure -hepatitis or liver disease -immune system problems -infection -lung or breathing disease, like COPD -multiple sclerosis -receiving phototherapy for the skin -seizure disorder -an unusual or allergic reaction to infliximab, mouse proteins, other medicines, foods, dyes, or preservatives -pregnant or trying to get pregnant -breast-feeding How should I use this medicine? This medicine is for injection into a vein. It is usually given by a health care professional in a hospital or clinic setting. A special MedGuide will be given to you by the pharmacist with each prescription and refill. Be sure to read this information carefully each time. Talk to your pediatrician regarding the use of this medicine in children. While this drug may be prescribed for children as young as 36 years of age for selected conditions, precautions do apply. Overdosage: If you think you have taken too much of this medicine contact a poison control center or emergency room at once. NOTE: This medicine is only for you. Do not share this medicine with others. What if I miss a dose? It is important not to miss your dose. Call your doctor or health care professional if you are unable to keep an appointment. What may interact with this medicine? Do not take this  medicine with any of the following medications: -biologic medicines such as abatacept, adalimumab, anakinra, certolizumab, etanercept, golimumab, rituximab, secukinumab, tocilizumab, tofactinib, ustekinumab -live vaccines This list may not describe all possible interactions. Give your health care provider a list of all the medicines, herbs, non-prescription drugs, or dietary supplements you use. Also tell them if you smoke, drink alcohol, or use illegal drugs. Some items may interact with your medicine. What should I watch for while using this medicine? Your condition will be monitored carefully while you are receiving this medicine. Visit your doctor or health care professional for regular checks on your progress. You may need blood work done while you are taking this medicine. Before beginning therapy, your doctor may do a test to see if you have been exposed to tuberculosis. Call your doctor or health care professional for advice if you get a fever, chills or sore throat, or other symptoms of a cold or flu. Do not treat yourself. This drug decreases your body's ability to fight infections. Try to avoid being around people who are sick. This medicine may make the symptoms of heart failure worse in some patients. If you notice symptoms such as increased shortness of breath or swelling of the ankles or legs, contact your health care provider right away. If you are going to have surgery or dental work, tell your health care professional or dentist that you have received this medicine. If you take this medicine for plaque psoriasis, stay out of the sun. If you cannot avoid being in the sun, wear protective clothing and use sunscreen. Do not use sun lamps or tanning beds/booths. Talk to your doctor about your risk of cancer. You may be  more at risk for certain types of cancers if you take this medicine. What side effects may I notice from receiving this medicine? Side effects that you should report to your  doctor or health care professional as soon as possible: -allergic reactions like skin rash, itching or hives, swelling of the face, lips, or tongue -breathing problems -changes in vision -chest pain -fever or chills, usually related to the infusion -joint pain -pain, tingling, numbness in the hands or feet -redness, blistering, peeling or loosening of the skin, including inside the mouth -seizures -signs of infection - fever or chills, cough, sore throat, flu-like symptoms, pain or difficulty passing urine -signs and symptoms of liver injury like dark yellow or brown urine; general ill feeling; light-colored stools; loss of appetite; nausea; right upper belly pain; unusually weak or tired; yellowing of the eyes or skin -signs and symptoms of a stroke like changes in vision; confusion; trouble speaking or understanding; severe headaches; sudden numbness or weakness of the face, arm or leg; trouble walking; dizziness; loss of balance or coordination -swelling of the ankles, feet, or hands -swollen lymph nodes in the neck, underarm, or groin areas -unusual bleeding or bruising -unusually weak or tired Side effects that usually do not require medical attention (report to your doctor or health care professional if they continue or are bothersome): -headache -nausea -stomach pain -upset stomach This list may not describe all possible side effects. Call your doctor for medical advice about side effects. You may report side effects to FDA at 1-800-FDA-1088. Where should I keep my medicine? This drug is given in a hospital or clinic and will not be stored at home. NOTE: This sheet is a summary. It may not cover all possible information. If you have questions about this medicine, talk to your doctor, pharmacist, or health care provider.  2018 Elsevier/Gold Standard (2016-09-10 13:45:32)

## 2017-12-16 ENCOUNTER — Encounter (HOSPITAL_COMMUNITY)
Admission: RE | Admit: 2017-12-16 | Discharge: 2017-12-16 | Disposition: A | Discharge status: Home / Self Care | Payer: Medicaid Other | Source: Ambulatory Visit | Attending: Internal Medicine | Admitting: Internal Medicine

## 2017-12-16 ENCOUNTER — Encounter (HOSPITAL_COMMUNITY): Payer: Self-pay

## 2017-12-16 DIAGNOSIS — K50919 Crohn's disease, unspecified, with unspecified complications: Secondary | ICD-10-CM | POA: Diagnosis present

## 2017-12-16 MED ORDER — SODIUM CHLORIDE 0.9 % IV SOLN
5.0000 mg/kg | INTRAVENOUS | Status: DC
  Administered 2017-12-16: 300 mg via INTRAVENOUS
  Filled 2017-12-16: qty 30

## 2017-12-16 MED ORDER — ACETAMINOPHEN 325 MG PO TABS
650.0000 mg | ORAL_TABLET | Freq: Every day | ORAL | Status: DC
  Administered 2017-12-16: 650 mg via ORAL
  Filled 2017-12-16: qty 2

## 2017-12-16 MED ORDER — DIPHENHYDRAMINE HCL 25 MG PO CAPS
50.0000 mg | ORAL_CAPSULE | Freq: Every day | ORAL | Status: DC
  Administered 2017-12-16: 50 mg via ORAL
  Filled 2017-12-16: qty 2

## 2017-12-16 MED ORDER — SODIUM CHLORIDE 0.9 % IV SOLN
INTRAVENOUS | Status: DC
  Administered 2017-12-16: 10:00:00 via INTRAVENOUS

## 2018-02-09 ENCOUNTER — Other Ambulatory Visit: Payer: Self-pay

## 2018-02-09 ENCOUNTER — Other Ambulatory Visit (HOSPITAL_COMMUNITY): Payer: Self-pay | Admitting: General Practice

## 2018-02-09 DIAGNOSIS — K50919 Crohn's disease, unspecified, with unspecified complications: Secondary | ICD-10-CM

## 2018-02-17 ENCOUNTER — Encounter (HOSPITAL_COMMUNITY)
Admission: RE | Admit: 2018-02-17 | Discharge: 2018-02-17 | Disposition: A | Discharge status: Home / Self Care | Payer: Medicaid Other | Source: Ambulatory Visit | Attending: Internal Medicine | Admitting: Internal Medicine

## 2018-02-17 ENCOUNTER — Encounter (HOSPITAL_COMMUNITY): Payer: Self-pay

## 2018-02-17 DIAGNOSIS — K50919 Crohn's disease, unspecified, with unspecified complications: Secondary | ICD-10-CM | POA: Insufficient documentation

## 2018-02-17 MED ORDER — ACETAMINOPHEN 325 MG PO TABS
650.0000 mg | ORAL_TABLET | Freq: Every day | ORAL | Status: DC
  Administered 2018-02-17: 650 mg via ORAL
  Filled 2018-02-17: qty 2

## 2018-02-17 MED ORDER — SODIUM CHLORIDE 0.9 % IV SOLN
5.0000 mg/kg | INTRAVENOUS | Status: DC
  Administered 2018-02-17: 300 mg via INTRAVENOUS
  Filled 2018-02-17: qty 30

## 2018-02-17 MED ORDER — DIPHENHYDRAMINE HCL 25 MG PO CAPS
50.0000 mg | ORAL_CAPSULE | Freq: Every day | ORAL | Status: DC
  Administered 2018-02-17: 50 mg via ORAL
  Filled 2018-02-17: qty 2

## 2018-02-17 MED ORDER — SODIUM CHLORIDE 0.9 % IV SOLN
INTRAVENOUS | Status: DC
  Administered 2018-02-17: 13:00:00 via INTRAVENOUS

## 2018-02-17 NOTE — Discharge Instructions (Signed)
Remicade Infliximab injection What is this medicine? INFLIXIMAB (in [place] i mab) is used to treat Crohn's disease and ulcerative colitis. It is also used to treat ankylosing spondylitis, plaque psoriasis, and some forms of arthritis. This medicine may be used for other purposes; ask your health care provider or pharmacist if you have questions. COMMON BRAND NAME(S): INFLECTRA, Remicade, RENFLEXIS What should I tell my health care provider before I take this medicine? They need to know if you have any of these conditions: -cancer -current or past resident of [place] or [place] -diabetes -exposure to tuberculosis -Guillain-Barre syndrome -heart failure -hepatitis or liver disease -immune system problems -infection -lung or breathing disease, like COPD -multiple sclerosis -receiving phototherapy for the skin -seizure disorder -an unusual or allergic reaction to infliximab, mouse proteins, other medicines, foods, dyes, or preservatives -pregnant or trying to get pregnant -breast-feeding How should I use this medicine? This medicine is for injection into a vein. It is usually given by a health care professional in a hospital or clinic setting. A special MedGuide will be given to you by the pharmacist with each prescription and refill. Be sure to read this information carefully each time. Talk to your pediatrician regarding the use of this medicine in children. While this drug may be prescribed for children as young as 52 years of age for selected conditions, precautions do apply. Overdosage: If you think you have taken too much of this medicine contact a poison control center or emergency room at once. NOTE: This medicine is only for you. Do not share this medicine with others. What if I miss a dose? It is important not to miss your dose. Call your doctor or health care professional if you are unable to keep an appointment. What may interact with this medicine? Do not take  this medicine with any of the following medications: -biologic medicines such as abatacept, adalimumab, anakinra, certolizumab, etanercept, golimumab, rituximab, secukinumab, tocilizumab, tofactinib, ustekinumab -live vaccines This list may not describe all possible interactions. Give your health care provider a list of all the medicines, herbs, non-prescription drugs, or dietary supplements you use. Also tell them if you smoke, drink alcohol, or use illegal drugs. Some items may interact with your medicine. What should I watch for while using this medicine? Your condition will be monitored carefully while you are receiving this medicine. Visit your doctor or health care professional for regular checks on your progress. You may need blood work done while you are taking this medicine. Before beginning therapy, your doctor may do a test to see if you have been exposed to tuberculosis. Call your doctor or health care professional for advice if you get a fever, chills or sore throat, or other symptoms of a cold or flu. Do not treat yourself. This drug decreases your body's ability to fight infections. Try to avoid being around people who are sick. This medicine may make the symptoms of heart failure worse in some patients. If you notice symptoms such as increased shortness of breath or swelling of the ankles or legs, contact your health care provider right away. If you are going to have surgery or dental work, tell your health care professional or dentist that you have received this medicine. If you take this medicine for plaque psoriasis, stay out of the sun. If you cannot avoid being in the sun, wear protective clothing and use sunscreen. Do not use sun lamps or tanning beds/booths. Talk to your doctor about your risk of cancer. You may  be more at risk for certain types of cancers if you take this medicine. What side effects may I notice from receiving this medicine? Side effects that you should report to  your doctor or health care professional as soon as possible: -allergic reactions like skin rash, itching or hives, swelling of the face, lips, or tongue -breathing problems -changes in vision -chest pain -fever or chills, usually related to the infusion -joint pain -pain, tingling, numbness in the hands or feet -redness, blistering, peeling or loosening of the skin, including inside the mouth -seizures -signs of infection - fever or chills, cough, sore throat, flu-like symptoms, pain or difficulty passing urine -signs and symptoms of liver injury like dark yellow or brown urine; general ill feeling; light-colored stools; loss of appetite; nausea; right upper belly pain; unusually weak or tired; yellowing of the eyes or skin -signs and symptoms of a stroke like changes in vision; confusion; trouble speaking or understanding; severe headaches; sudden numbness or weakness of the face, arm or leg; trouble walking; dizziness; loss of balance or coordination -swelling of the ankles, feet, or hands -swollen lymph nodes in the neck, underarm, or groin areas -unusual bleeding or bruising -unusually weak or tired Side effects that usually do not require medical attention (report to your doctor or health care professional if they continue or are bothersome): -headache -nausea -stomach pain -upset stomach This list may not describe all possible side effects. Call your doctor for medical advice about side effects. You may report side effects to FDA at 1-800-FDA-1088. Where should I keep my medicine? This drug is given in a hospital or clinic and will not be stored at home. NOTE: This sheet is a summary. It may not cover all possible information. If you have questions about this medicine, talk to your doctor, pharmacist, or health care provider.  2018 Elsevier/Gold Standard (2016-09-10 13:45:32)

## 2018-04-21 ENCOUNTER — Other Ambulatory Visit (INDEPENDENT_AMBULATORY_CARE_PROVIDER_SITE_OTHER): Payer: Medicaid Other

## 2018-04-21 ENCOUNTER — Ambulatory Visit (INDEPENDENT_AMBULATORY_CARE_PROVIDER_SITE_OTHER): Payer: Medicaid Other | Admitting: Internal Medicine

## 2018-04-21 ENCOUNTER — Encounter: Payer: Self-pay | Admitting: Internal Medicine

## 2018-04-21 DIAGNOSIS — K56699 Other intestinal obstruction unspecified as to partial versus complete obstruction: Secondary | ICD-10-CM | POA: Diagnosis not present

## 2018-04-21 DIAGNOSIS — Z79899 Other long term (current) drug therapy: Secondary | ICD-10-CM

## 2018-04-21 DIAGNOSIS — K5000 Crohn's disease of small intestine without complications: Secondary | ICD-10-CM

## 2018-04-21 DIAGNOSIS — Z7960 Long term (current) use of unspecified immunomodulators and immunosuppressants: Secondary | ICD-10-CM

## 2018-04-21 LAB — COMPREHENSIVE METABOLIC PANEL
ALBUMIN: 4.9 g/dL (ref 3.5–5.2)
ALK PHOS: 93 U/L (ref 39–117)
ALT: 5 U/L (ref 0–53)
AST: 17 U/L (ref 0–37)
BILIRUBIN TOTAL: 0.9 mg/dL (ref 0.2–1.2)
BUN: 8 mg/dL (ref 6–23)
CO2: 24 mEq/L (ref 19–32)
CREATININE: 1.05 mg/dL (ref 0.40–1.50)
Calcium: 9.9 mg/dL (ref 8.4–10.5)
Chloride: 105 mEq/L (ref 96–112)
GFR: 109.69 mL/min (ref >60.00)
Glucose, Bld: 79 mg/dL (ref 70–99)
Potassium: 3.6 mEq/L (ref 3.5–5.1)
Sodium: 140 mEq/L (ref 135–145)
TOTAL PROTEIN: 8.3 g/dL (ref 6.0–8.3)

## 2018-04-21 LAB — CBC WITH DIFFERENTIAL/PLATELET
BASOS ABS: 0 10*3/uL (ref 0.0–0.1)
BASOS PCT: 0.7 % (ref 0.0–3.0)
EOS ABS: 0 10*3/uL (ref 0.0–0.7)
Eosinophils Relative: 0.5 % (ref 0.0–5.0)
HCT: 45.4 % (ref 39.0–52.0)
Hemoglobin: 15.6 g/dL (ref 13.0–17.0)
Lymphocytes Relative: 45.8 % (ref 12.0–46.0)
Lymphs Abs: 2.7 10*3/uL (ref 0.7–4.0)
MCHC: 34.4 g/dL (ref 30.0–36.0)
MCV: 91.1 fl (ref 78.0–100.0)
MONO ABS: 0.3 10*3/uL (ref 0.1–1.0)
Monocytes Relative: 5.4 % (ref 3.0–12.0)
Neutro Abs: 2.8 10*3/uL (ref 1.4–7.7)
Neutrophils Relative %: 47.6 % (ref 43.0–77.0)
Platelets: 170 10*3/uL (ref 150.0–400.0)
RBC: 4.98 Mil/uL (ref 4.22–5.81)
RDW: 12.8 % (ref 11.5–15.5)
WBC: 5.9 10*3/uL (ref 4.0–10.5)

## 2018-04-21 NOTE — Patient Instructions (Signed)
Your provider has requested that you go to the basement level for lab work before leaving today. Press "B" on the elevator. The lab is located at the first door on the left as you exit the elevator.   Please follow up with Dr Carlean Purl in 6 months.    I appreciate the opportunity to care for you. Silvano Rusk, MD, [facility]

## 2018-04-21 NOTE — Assessment & Plan Note (Addendum)
Stable doing well on Remicade 5 mg IV per kilogram every 8 weeks Labs labs will be checked today Return in 6 months sooner as needed

## 2018-04-21 NOTE — Assessment & Plan Note (Addendum)
Labs will be checked today.  Immunizations are up-to-date.  Recheck QuantiFERON and hepatitis B studies.

## 2018-04-21 NOTE — Progress Notes (Signed)
   Philip Joseph 27 y.o. April 16, 1991 [id number]  Assessment & Plan:   Crohn's ileitis Stable doing well on Remicade 5 mg IV per kilogram every 8 weeks Labs labs will be checked today Return in 6 months sooner as needed   Long-term use of immunosuppressant medication- Remicade Labs will be checked today.  Immunizations are up-to-date.  Recheck QuantiFERON and hepatitis B studies.    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  Unfortunately his grandfather has lung cancer and is "not doing well".  Ratio on is living with his or his girlfriends parents now and there is son.  He remains on disability he is not working.  GF lung ca Wt Readings from Last 3 Encounters:  04/21/18 130 lb 2 oz (59 kg)  02/17/18 131 lb 6 oz (59.6 kg)  12/16/17 132 lb (59.9 kg)    Allergies  Allergen Reactions  . Penicillins Rash    Has patient had a PCN reaction causing immediate rash, facial/tongue/throat swelling, SOB or lightheadedness with hypotension: No Has patient had a PCN reaction causing severe rash involving mucus membranes or skin necrosis: No Has patient had a PCN reaction that required hospitalization No Has patient had a PCN reaction occurring within the last 10 years: No If all of the above answers are "NO", then may proceed with Cephalosporin use.    Current Meds  Medication Sig  . dicyclomine (BENTYL) 20 MG tablet Take 1 tablet (20 mg total) by mouth 2 (two) times daily.  . InFLIXimab (REMICADE IV) Inject 300 mg into the vein every 8 (eight) weeks. Restarting remicade, 1st dose 12/29/16.  Will be doing induction: week 2- 01/12/17, week 4:02/09/17, week  8:04/06/17  . ondansetron (ZOFRAN ODT) 4 MG disintegrating tablet Take 1 tablet (4 mg total) by mouth every 8 (eight) hours as needed for nausea or vomiting.   Past Medical History:  Diagnosis Date  . ADHD (attention deficit hyperactivity disorder)   . Crohn's ileitis ([place]) 12/19/2013  . IBS (irritable bowel syndrome)   . Long term current use of systemic steroids 11/27/2015  . 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 Unspecified vitamin D deficiency 12/21/2013   Past Surgical History:  Procedure Laterality Date  . COLONOSCOPY N/A 12/20/2013   Procedure: COLONOSCOPY;  Surgeon: [name], MD;  Location: [facility];  Service: Endoscopy;  Laterality: N/A;  . COLONOSCOPY    . WRIST FRACTURE SURGERY     Social History   Social History Narrative   Single, 1 son born 2018.   Lives w/ baby's mother and son   + Smoker of tobacco and marijuana   Disability ?   family history includes Prostate cancer in his father.   Review of Systems As above  Objective:   Physical Exam BP 100/70 (BP Location: Left Arm, Patient Position: Sitting, Cuff Size: Normal)   Pulse 60   Ht 5' 7.75" (1.721 m)   Wt 130 lb 2 oz (59 kg)   BMI 19.93 kg/m  Eyes anict Thin well-developed well-nourished Lungs clear Heart sounds are normal Abdomen is scaphoid and nontender He has an appropriate mood and affect

## 2018-04-23 ENCOUNTER — Encounter (HOSPITAL_COMMUNITY): Payer: Self-pay

## 2018-04-23 ENCOUNTER — Encounter (HOSPITAL_COMMUNITY)
Admission: RE | Admit: 2018-04-23 | Discharge: 2018-04-23 | Disposition: A | Discharge status: Home / Self Care | Payer: Medicaid Other | Source: Ambulatory Visit | Attending: Internal Medicine | Admitting: Internal Medicine

## 2018-04-23 DIAGNOSIS — K50919 Crohn's disease, unspecified, with unspecified complications: Secondary | ICD-10-CM | POA: Insufficient documentation

## 2018-04-23 LAB — QUANTIFERON-TB GOLD PLUS
NIL: 0.09 [IU]/mL
QUANTIFERON-TB GOLD PLUS: NEGATIVE
TB2-NIL: <0 IU/mL

## 2018-04-23 LAB — HEPATITIS B SURFACE ANTIBODY,QUALITATIVE: HEP B S AB: NONREACTIVE

## 2018-04-23 LAB — HEPATITIS B SURFACE ANTIGEN: Hepatitis B Surface Ag: NONREACTIVE

## 2018-04-23 LAB — HEPATITIS B CORE ANTIBODY, TOTAL: Hep B Core Total Ab: NONREACTIVE

## 2018-04-23 MED ORDER — ACETAMINOPHEN 325 MG PO TABS
650.0000 mg | ORAL_TABLET | Freq: Every day | ORAL | Status: DC
  Administered 2018-04-23: 650 mg via ORAL
  Filled 2018-04-23: qty 2

## 2018-04-23 MED ORDER — SODIUM CHLORIDE 0.9 % IV SOLN
INTRAVENOUS | Status: DC
  Administered 2018-04-23: 10:00:00 via INTRAVENOUS

## 2018-04-23 MED ORDER — SODIUM CHLORIDE 0.9 % IV SOLN
5.0000 mg/kg | INTRAVENOUS | Status: AC
  Administered 2018-04-23: 300 mg via INTRAVENOUS
  Filled 2018-04-23: qty 30

## 2018-04-23 MED ORDER — DIPHENHYDRAMINE HCL 25 MG PO CAPS
50.0000 mg | ORAL_CAPSULE | Freq: Every day | ORAL | Status: DC
  Administered 2018-04-23: 50 mg via ORAL
  Filled 2018-04-23 (×2): qty 2

## 2018-04-23 NOTE — Discharge Instructions (Signed)
Infliximab injection What is this medicine? INFLIXIMAB (in [place] i mab) is used to treat Crohn's disease and ulcerative colitis. It is also used to treat ankylosing spondylitis, plaque psoriasis, and some forms of arthritis. This medicine may be used for other purposes; ask your health care provider or pharmacist if you have questions. COMMON BRAND NAME(S): INFLECTRA, Remicade, RENFLEXIS What should I tell my health care provider before I take this medicine? They need to know if you have any of these conditions: -cancer -current or past resident of [place] or [place] -diabetes -exposure to tuberculosis -Guillain-Barre syndrome -heart failure -hepatitis or liver disease -immune system problems -infection -lung or breathing disease, like COPD -multiple sclerosis -receiving phototherapy for the skin -seizure disorder -an unusual or allergic reaction to infliximab, mouse proteins, other medicines, foods, dyes, or preservatives -pregnant or trying to get pregnant -breast-feeding How should I use this medicine? This medicine is for injection into a vein. It is usually given by a health care professional in a hospital or clinic setting. A special MedGuide will be given to you by the pharmacist with each prescription and refill. Be sure to read this information carefully each time. Talk to your pediatrician regarding the use of this medicine in children. While this drug may be prescribed for children as young as 42 years of age for selected conditions, precautions do apply. Overdosage: If you think you have taken too much of this medicine contact a poison control center or emergency room at once. NOTE: This medicine is only for you. Do not share this medicine with others. What if I miss a dose? It is important not to miss your dose. Call your doctor or health care professional if you are unable to keep an appointment. What may interact with this medicine? Do not take this  medicine with any of the following medications: -biologic medicines such as abatacept, adalimumab, anakinra, certolizumab, etanercept, golimumab, rituximab, secukinumab, tocilizumab, tofactinib, ustekinumab -live vaccines This list may not describe all possible interactions. Give your health care provider a list of all the medicines, herbs, non-prescription drugs, or dietary supplements you use. Also tell them if you smoke, drink alcohol, or use illegal drugs. Some items may interact with your medicine. What should I watch for while using this medicine? Your condition will be monitored carefully while you are receiving this medicine. Visit your doctor or health care professional for regular checks on your progress. You may need blood work done while you are taking this medicine. Before beginning therapy, your doctor may do a test to see if you have been exposed to tuberculosis. Call your doctor or health care professional for advice if you get a fever, chills or sore throat, or other symptoms of a cold or flu. Do not treat yourself. This drug decreases your body's ability to fight infections. Try to avoid being around people who are sick. This medicine may make the symptoms of heart failure worse in some patients. If you notice symptoms such as increased shortness of breath or swelling of the ankles or legs, contact your health care provider right away. If you are going to have surgery or dental work, tell your health care professional or dentist that you have received this medicine. If you take this medicine for plaque psoriasis, stay out of the sun. If you cannot avoid being in the sun, wear protective clothing and use sunscreen. Do not use sun lamps or tanning beds/booths. Talk to your doctor about your risk of cancer. You may be  more at risk for certain types of cancers if you take this medicine. What side effects may I notice from receiving this medicine? Side effects that you should report to your  doctor or health care professional as soon as possible: -allergic reactions like skin rash, itching or hives, swelling of the face, lips, or tongue -breathing problems -changes in vision -chest pain -fever or chills, usually related to the infusion -joint pain -pain, tingling, numbness in the hands or feet -redness, blistering, peeling or loosening of the skin, including inside the mouth -seizures -signs of infection - fever or chills, cough, sore throat, flu-like symptoms, pain or difficulty passing urine -signs and symptoms of liver injury like dark yellow or brown urine; general ill feeling; light-colored stools; loss of appetite; nausea; right upper belly pain; unusually weak or tired; yellowing of the eyes or skin -signs and symptoms of a stroke like changes in vision; confusion; trouble speaking or understanding; severe headaches; sudden numbness or weakness of the face, arm or leg; trouble walking; dizziness; loss of balance or coordination -swelling of the ankles, feet, or hands -swollen lymph nodes in the neck, underarm, or groin areas -unusual bleeding or bruising -unusually weak or tired Side effects that usually do not require medical attention (report to your doctor or health care professional if they continue or are bothersome): -headache -nausea -stomach pain -upset stomach This list may not describe all possible side effects. Call your doctor for medical advice about side effects. You may report side effects to FDA at 1-800-FDA-1088. Where should I keep my medicine? This drug is given in a hospital or clinic and will not be stored at home. NOTE: This sheet is a summary. It may not cover all possible information. If you have questions about this medicine, talk to your doctor, pharmacist, or health care provider.  2018 Elsevier/Gold Standard (2016-09-10 13:45:32)

## 2018-04-23 NOTE — Assessment & Plan Note (Signed)
Does not seem to be symptomatic

## 2018-04-24 NOTE — Progress Notes (Signed)
Labs ok but he is not immune to hepatitis B   He should be vaccinated for that

## 2018-04-27 ENCOUNTER — Other Ambulatory Visit: Payer: Self-pay

## 2018-06-17 ENCOUNTER — Other Ambulatory Visit (HOSPITAL_COMMUNITY): Payer: Self-pay | Admitting: General Practice

## 2018-06-17 ENCOUNTER — Other Ambulatory Visit: Payer: Self-pay

## 2018-06-17 DIAGNOSIS — K5000 Crohn's disease of small intestine without complications: Secondary | ICD-10-CM

## 2018-06-22 ENCOUNTER — Encounter (HOSPITAL_COMMUNITY)
Admission: RE | Admit: 2018-06-22 | Discharge: 2018-06-22 | Disposition: A | Discharge status: Home / Self Care | Payer: Medicaid Other | Source: Ambulatory Visit | Attending: Internal Medicine | Admitting: Internal Medicine

## 2018-06-22 ENCOUNTER — Encounter (HOSPITAL_COMMUNITY): Payer: Self-pay

## 2018-06-22 DIAGNOSIS — K50919 Crohn's disease, unspecified, with unspecified complications: Secondary | ICD-10-CM | POA: Diagnosis present

## 2018-06-22 DIAGNOSIS — K5000 Crohn's disease of small intestine without complications: Secondary | ICD-10-CM

## 2018-06-22 MED ORDER — SODIUM CHLORIDE 0.9 % IV SOLN
INTRAVENOUS | Status: DC
  Administered 2018-06-22: 09:00:00 via INTRAVENOUS

## 2018-06-22 MED ORDER — INFLIXIMAB 100 MG IV SOLR
8.0000 mg/kg | Freq: Once | INTRAVENOUS | Status: AC
  Administered 2018-06-22: 500 mg via INTRAVENOUS
  Filled 2018-06-22: qty 50

## 2018-08-12 ENCOUNTER — Other Ambulatory Visit: Payer: Self-pay

## 2018-08-12 DIAGNOSIS — K5000 Crohn's disease of small intestine without complications: Secondary | ICD-10-CM

## 2018-08-23 ENCOUNTER — Encounter (HOSPITAL_COMMUNITY)
Admission: RE | Admit: 2018-08-23 | Discharge: 2018-08-23 | Disposition: A | Discharge status: Home / Self Care | Payer: Medicaid Other | Source: Ambulatory Visit | Attending: Internal Medicine | Admitting: Internal Medicine

## 2018-08-23 ENCOUNTER — Encounter (HOSPITAL_COMMUNITY): Payer: Self-pay

## 2018-08-23 DIAGNOSIS — K50919 Crohn's disease, unspecified, with unspecified complications: Secondary | ICD-10-CM | POA: Insufficient documentation

## 2018-08-23 DIAGNOSIS — K5000 Crohn's disease of small intestine without complications: Secondary | ICD-10-CM

## 2018-08-23 MED ORDER — SODIUM CHLORIDE 0.9 % IV SOLN
5.0000 mg/kg | INTRAVENOUS | Status: DC
  Administered 2018-08-23: 300 mg via INTRAVENOUS
  Filled 2018-08-23: qty 30

## 2018-08-23 MED ORDER — ACETAMINOPHEN 325 MG PO TABS
650.0000 mg | ORAL_TABLET | Freq: Once | ORAL | Status: AC
  Administered 2018-08-23: 650 mg via ORAL
  Filled 2018-08-23: qty 2

## 2018-08-23 MED ORDER — SODIUM CHLORIDE 0.9 % IV SOLN
INTRAVENOUS | Status: DC
  Administered 2018-08-23: 08:00:00 via INTRAVENOUS

## 2018-08-23 MED ORDER — DIPHENHYDRAMINE HCL 25 MG PO CAPS
25.0000 mg | ORAL_CAPSULE | Freq: Once | ORAL | Status: AC
  Administered 2018-08-23: 25 mg via ORAL
  Filled 2018-08-23: qty 1

## 2018-09-26 ENCOUNTER — Other Ambulatory Visit: Payer: Self-pay

## 2018-09-26 ENCOUNTER — Encounter (HOSPITAL_COMMUNITY): Payer: Self-pay | Admitting: Emergency Medicine

## 2018-09-26 ENCOUNTER — Emergency Department (HOSPITAL_COMMUNITY)
Admission: EM | Admit: 2018-09-26 | Discharge: 2018-09-26 | Disposition: A | Discharge status: Home / Self Care | Payer: Medicaid Other | Attending: Emergency Medicine | Admitting: Emergency Medicine

## 2018-09-26 DIAGNOSIS — R112 Nausea with vomiting, unspecified: Secondary | ICD-10-CM | POA: Diagnosis not present

## 2018-09-26 DIAGNOSIS — F17210 Nicotine dependence, cigarettes, uncomplicated: Secondary | ICD-10-CM | POA: Diagnosis not present

## 2018-09-26 DIAGNOSIS — R1030 Lower abdominal pain, unspecified: Secondary | ICD-10-CM | POA: Diagnosis present

## 2018-09-26 DIAGNOSIS — Z79899 Other long term (current) drug therapy: Secondary | ICD-10-CM | POA: Insufficient documentation

## 2018-09-26 LAB — URINALYSIS, ROUTINE W REFLEX MICROSCOPIC
Bacteria, UA: NONE SEEN
Bilirubin Urine: NEGATIVE
Glucose, UA: NEGATIVE mg/dL
Hgb urine dipstick: NEGATIVE
Ketones, ur: NEGATIVE mg/dL
Leukocytes, UA: NEGATIVE
Nitrite: NEGATIVE
Protein, ur: 100 mg/dL — AB
Specific Gravity, Urine: 1.024 (ref 1.005–1.030)
pH: 8 (ref 5.0–8.0)

## 2018-09-26 LAB — COMPREHENSIVE METABOLIC PANEL
ALT: 23 U/L (ref 0–44)
AST: 33 U/L (ref 15–41)
Albumin: 5.4 g/dL — ABNORMAL HIGH (ref 3.5–5.0)
Alkaline Phosphatase: 72 U/L (ref 38–126)
Anion gap: 11 (ref 5–15)
BUN: 9 mg/dL (ref 6–20)
CO2: 26 mmol/L (ref 22–32)
Calcium: 9.7 mg/dL (ref 8.9–10.3)
Chloride: 102 mmol/L (ref 98–111)
Creatinine, Ser: 0.96 mg/dL (ref 0.61–1.24)
GFR calc Af Amer: >60 mL/min (ref >60)
Glucose, Bld: 136 mg/dL — ABNORMAL HIGH (ref 70–99)
Potassium: 3.5 mmol/L (ref 3.5–5.1)
Sodium: 139 mmol/L (ref 135–145)
Total Bilirubin: 1.2 mg/dL (ref 0.3–1.2)
Total Protein: 8.6 g/dL — ABNORMAL HIGH (ref 6.5–8.1)

## 2018-09-26 LAB — CBC
HCT: 45.9 % (ref 39.0–52.0)
Hemoglobin: 15.9 g/dL (ref 13.0–17.0)
MCH: 31.5 pg (ref 26.0–34.0)
MCHC: 34.6 g/dL (ref 30.0–36.0)
MCV: 90.9 fL (ref 80.0–100.0)
Platelets: 200 10*3/uL (ref 150–400)
RBC: 5.05 MIL/uL (ref 4.22–5.81)
RDW: 12 % (ref 11.5–15.5)
WBC: 13 10*3/uL — ABNORMAL HIGH (ref 4.0–10.5)
nRBC: 0 % (ref 0.0–0.2)

## 2018-09-26 LAB — LIPASE, BLOOD: LIPASE: 25 U/L (ref 11–51)

## 2018-09-26 MED ORDER — DICYCLOMINE HCL 10 MG PO CAPS
10.0000 mg | ORAL_CAPSULE | Freq: Once | ORAL | Status: AC
  Administered 2018-09-26: 10 mg via ORAL
  Filled 2018-09-26: qty 1

## 2018-09-26 MED ORDER — SODIUM CHLORIDE 0.9 % IV BOLUS
1000.0000 mL | Freq: Once | INTRAVENOUS | Status: AC
  Administered 2018-09-26: 1000 mL via INTRAVENOUS

## 2018-09-26 MED ORDER — DICYCLOMINE HCL 20 MG PO TABS: 20.0000 mg | ORAL_TABLET | Freq: Two times a day (BID) | ORAL | 0 refills | Status: DC | PRN

## 2018-09-26 MED ORDER — KETOROLAC TROMETHAMINE 15 MG/ML IJ SOLN
15.0000 mg | Freq: Once | INTRAMUSCULAR | Status: AC
  Administered 2018-09-26: 15 mg via INTRAVENOUS
  Filled 2018-09-26: qty 1

## 2018-09-26 MED ORDER — METOCLOPRAMIDE HCL 5 MG/ML IJ SOLN
10.0000 mg | Freq: Once | INTRAMUSCULAR | Status: AC
  Administered 2018-09-26: 10 mg via INTRAVENOUS
  Filled 2018-09-26: qty 2

## 2018-09-26 MED ORDER — ONDANSETRON 4 MG PO TBDP: 4.0000 mg | ORAL_TABLET | Freq: Three times a day (TID) | ORAL | 0 refills | Status: DC | PRN

## 2018-09-26 MED ORDER — ONDANSETRON HCL 4 MG/2ML IJ SOLN
4.0000 mg | Freq: Once | INTRAMUSCULAR | Status: AC
  Administered 2018-09-26: 4 mg via INTRAVENOUS
  Filled 2018-09-26: qty 2

## 2018-09-26 MED ORDER — SODIUM CHLORIDE 0.9% FLUSH
3.0000 mL | Freq: Once | INTRAVENOUS | Status: AC
  Administered 2018-09-26: 3 mL via INTRAVENOUS

## 2018-09-26 NOTE — ED Notes (Signed)
Pt refused to remove his sweatshirt in order to have vitals taken. Pt told that I would be able  To complete his triage when he is able to remove sweatshirt so I can get vitals.

## 2018-09-26 NOTE — ED Notes (Signed)
Bed: WA01 Expected date:  Expected time:  Means of arrival:  Comments: 

## 2018-09-26 NOTE — ED Triage Notes (Signed)
Pt c/o abdominal pain and emesis since this morning.

## 2018-09-26 NOTE — ED Notes (Signed)
Requested for patient to complete phone call while patient is triaged and pt refused. Triage paused while patient completes phone call.

## 2018-09-26 NOTE — ED Provider Notes (Signed)
[facility] DEPT Provider Note   CSN: [id number] Arrival date & time: 09/26/18  1044     History   Chief Complaint Chief Complaint  Patient presents with  . Abdominal Pain  . Emesis    HPI Philip Joseph is a 28 y.o. male presenting for evaluation of nausea, vomiting, abdominal pain.  Patient states symptoms began at 3:00 this morning.  He reports multiple episodes of vomiting, denies hematemesis.  He reports lower abdominal pain, which is constant.  He reports frequent bowel movements today, which he states is normal.  He denies diarrhea or blood in the stool.  Patient has a history of Crohn's for which he gets Remicade infusions, last infusion 1 month ago.  He denies fevers, chills, chest pain, shortness of breath, or abnormal urination.  He denies sick contacts or recent travel.  Patient states this feels like a Crohn's flare. His pain is constant. Nothing makes it better or worse. He took pepto bismal without improvement of sxs, has not tried anything else. He sees Dr. Carlean Purl with GI.   HPI  Past Medical History:  Diagnosis Date  . ADHD (attention deficit hyperactivity disorder)   . Crohn's ileitis ([place]) 12/19/2013  . IBS (irritable bowel syndrome)   . Long term current use of systemic steroids 11/27/2015  . 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 Unspecified vitamin D deficiency 12/21/2013    Patient Active Problem List   Diagnosis Date Noted  . Long-term use of immunosuppressant medication- Remicade 12/09/2016  . 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 Smoker 11/29/2014  . Vitamin D deficiency 12/21/2013  . 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 Crohn's ileitis ([place]) 12/19/2013  . Attention deficit hyperactivity disorder (ADHD) 04/22/2010    Past Surgical History:  Procedure Laterality Date  . COLONOSCOPY N/A 12/20/2013   Procedure: COLONOSCOPY;  Surgeon: [name], MD;  Location: [facility];   Service: Endoscopy;  Laterality: N/A;  . COLONOSCOPY    . WRIST FRACTURE SURGERY          Home Medications    Prior to Admission medications   Medication Sig Start Date End Date Taking? Authorizing Provider  InFLIXimab (REMICADE IV) Inject 300 mg into the vein every 8 (eight) weeks.    Yes [provider]  dicyclomine (BENTYL) 20 MG tablet Take 1 tablet (20 mg total) by mouth 2 (two) times daily. Patient not taking: Reported on 09/26/2018 04/30/17   Carlisle Cater, PA-C  ondansetron (ZOFRAN ODT) 4 MG disintegrating tablet Take 1 tablet (4 mg total) by mouth every 8 (eight) hours as needed for nausea or vomiting. Patient not taking: Reported on 09/26/2018 04/30/17   Carlisle Cater, PA-C    Family History Family History  Problem Relation Age of Onset  . Prostate cancer Father     Social History Social History   Tobacco Use  . Smoking status: Current Some Day Smoker    Types: Cigarettes  . Smokeless tobacco: Never Used  Substance Use Topics  . Alcohol use: No    Alcohol/week: 0.0 standard drinks  . Drug use: Yes    Types: Marijuana    Comment: Denies      Allergies   Penicillins   Review of Systems Review of Systems  Gastrointestinal: Positive for abdominal pain, nausea and vomiting.  All other systems reviewed and are negative.    Physical Exam Updated Vital Signs BP (!) 114/99 (BP Location: Right Arm)   Pulse (!) 59  Temp 97.9 F (36.6 C) (Oral)   Resp 19   SpO2 100%   Physical Exam Vitals signs and nursing note reviewed.  Constitutional:      General: He is not in acute distress.    Appearance: He is well-developed.     Comments: Appears nontoxic  HENT:     Head: Normocephalic and atraumatic.  Eyes:     Conjunctiva/sclera: Conjunctivae normal.     Pupils: Pupils are equal, round, and reactive to light.  Neck:     Musculoskeletal: Normal range of motion and neck supple.  Cardiovascular:     Rate and Rhythm: Normal rate and regular rhythm.    Pulmonary:     Effort: Pulmonary effort is normal. No respiratory distress.     Breath sounds: Normal breath sounds. No wheezing.  Abdominal:     General: There is no distension.     Palpations: Abdomen is soft.     Tenderness: There is abdominal tenderness.     Comments: Generalized abd pain. No focal tenderness. No rigidity, guarding, or distention.  Musculoskeletal: Normal range of motion.  Skin:    General: Skin is warm and dry.     Capillary Refill: Capillary refill takes less than 2 seconds.  Neurological:     Mental Status: He is alert and oriented to person, place, and time.      ED Treatments / Results  Labs (all labs ordered are listed, but only abnormal results are displayed) Labs Reviewed  COMPREHENSIVE METABOLIC PANEL - Abnormal; Notable for the following components:      Result Value   Glucose, Bld 136 (*)    Total Protein 8.6 (*)    Albumin 5.4 (*)    All other components within normal limits  CBC - Abnormal; Notable for the following components:   WBC 13.0 (*)    All other components within normal limits  URINALYSIS, ROUTINE W REFLEX MICROSCOPIC - Abnormal; Notable for the following components:   APPearance CLOUDY (*)    Protein, ur 100 (*)    All other components within normal limits  LIPASE, BLOOD    EKG None  Radiology No results found.  Procedures Procedures (including critical care time)  Medications Ordered in ED Medications  sodium chloride 0.9 % bolus 1,000 mL (1,000 mLs Intravenous New Bag/Given 09/26/18 1315)  sodium chloride flush (NS) 0.9 % injection 3 mL (3 mLs Intravenous Given 09/26/18 1151)  ondansetron (ZOFRAN) injection 4 mg (4 mg Intravenous Given 09/26/18 1315)  ketorolac (TORADOL) 15 MG/ML injection 15 mg (15 mg Intravenous Given 09/26/18 1316)     Initial Impression / Assessment and Plan / ED Course  I have reviewed the triage vital signs and the nursing notes.  Pertinent labs & imaging results that were available during my  care of the patient were reviewed by me and considered in my medical decision making (see chart for details).     Patient resenting for evaluation nausea, vomiting, abdominal pain.  Physical exam reassuring, he is afebrile not tachycardic.  Appears nontoxic.  Mild generalized tenderness, without focal pain.  As such, lower suspicion for appendicitis, cholecystitis, diverticulitis, perf, or surgical abdomen.  Consider Crohn's flare, gastroenteritis, food bourne GI illness. Will obtain labs, urine, treat symptomatically, and reassess.  On reassessment, patient reports pain is improved.  He is no longer vomiting.  Will trial p.o. labs with mild leukocytosis at 13, has had similar in the past. As sxs are improving, he is without focal tenderness, and he is  without fever, low suspicion that this is infectious. As such, will not order CT scan.   Informed by RN that pt is asking for oxycodone for sleep. Pt has had to be awoken every time I have walked in the room. As such, no narcotics have been given.   Pt tolerating PO without difficulty. Pain unchanged. Discussed findings and plan with pt. Pt to f/u with GI as needed if sxs continue. Pt states he is allergic to prednisone, will hold on steroids. bentyl and zofran for sx control. At this time, pt appears safe for d/c. Return precautions given. Pt states he understands and agrees to plan.   Final Clinical Impressions(s) / ED Diagnoses   Final diagnoses:  Non-intractable vomiting with nausea, unspecified vomiting type  Lower abdominal pain    ED Discharge Orders    None       Franchot Heidelberg, PA-C 09/26/18 1548    Daleen Bo, MD 09/26/18 [phone]

## 2018-09-26 NOTE — Discharge Instructions (Addendum)
Use zofran as needed for nausea or vomiting.  Use bentyl as needed for pain and cramping.  Use tylenol as needed for pain.  Be careful with your diet- avoid spicy, greasy, and acidic foods.  Follow up with your GI doctor in 3 days if your symptoms are not improving.  Return to the ER with any new, worsening, or concerning symptoms.

## 2018-09-26 NOTE — ED Notes (Signed)
Patient given to ginger ale and vomited back. Patient given Reglan will continue to monitor.

## 2018-10-12 ENCOUNTER — Other Ambulatory Visit (HOSPITAL_COMMUNITY): Payer: Self-pay | Admitting: General Practice

## 2018-10-12 ENCOUNTER — Other Ambulatory Visit: Payer: Self-pay

## 2018-10-12 DIAGNOSIS — K5000 Crohn's disease of small intestine without complications: Secondary | ICD-10-CM

## 2018-10-12 NOTE — Addendum Note (Signed)
Addended by: Marlon Pel on: 10/12/2018 10:54 AM   Modules accepted: Orders

## 2018-10-13 ENCOUNTER — Telehealth: Payer: Self-pay

## 2018-10-13 NOTE — Telephone Encounter (Signed)
-----   Message from [name], MD sent at 10/12/2018 11:43 AM EST ----- Regarding: f/u appointment Please have him see me in March or April

## 2018-10-13 NOTE — Telephone Encounter (Signed)
  Left Ramelo a detailed message to call back and set up appointment.

## 2018-10-14 NOTE — Telephone Encounter (Signed)
Left another message for Kacen to call us and set up an appointment.

## 2018-10-19 ENCOUNTER — Encounter (HOSPITAL_COMMUNITY): Payer: Self-pay

## 2018-10-19 ENCOUNTER — Encounter (HOSPITAL_COMMUNITY)
Admission: RE | Admit: 2018-10-19 | Discharge: 2018-10-19 | Disposition: A | Discharge status: Home / Self Care | Payer: Medicaid Other | Source: Ambulatory Visit | Attending: Internal Medicine | Admitting: Internal Medicine

## 2018-10-19 DIAGNOSIS — K50919 Crohn's disease, unspecified, with unspecified complications: Secondary | ICD-10-CM | POA: Diagnosis present

## 2018-10-19 DIAGNOSIS — K5000 Crohn's disease of small intestine without complications: Secondary | ICD-10-CM

## 2018-10-19 MED ORDER — SODIUM CHLORIDE 0.9 % IV SOLN
INTRAVENOUS | Status: DC
  Administered 2018-10-19: 09:00:00 via INTRAVENOUS

## 2018-10-19 MED ORDER — ACETAMINOPHEN 325 MG PO TABS
650.0000 mg | ORAL_TABLET | Freq: Every day | ORAL | Status: DC
  Administered 2018-10-19: 650 mg via ORAL
  Filled 2018-10-19: qty 2

## 2018-10-19 MED ORDER — DIPHENHYDRAMINE HCL 25 MG PO CAPS
50.0000 mg | ORAL_CAPSULE | Freq: Every day | ORAL | Status: DC
  Administered 2018-10-19: 50 mg via ORAL
  Filled 2018-10-19: qty 2

## 2018-10-19 MED ORDER — SODIUM CHLORIDE 0.9 % IV SOLN
5.0000 mg/kg | INTRAVENOUS | Status: DC
  Administered 2018-10-19: 300 mg via INTRAVENOUS
  Filled 2018-10-19: qty 30

## 2018-10-19 NOTE — Telephone Encounter (Signed)
Spoke with him and booked appointment for 11/11/2018 at 8:45AM to see Dr Carlean Purl.

## 2018-10-19 NOTE — Discharge Instructions (Signed)
Infliximab injection What is this medicine? INFLIXIMAB (in [place] i mab) is used to treat Crohn's disease and ulcerative colitis. It is also used to treat ankylosing spondylitis, plaque psoriasis, and some forms of arthritis. This medicine may be used for other purposes; ask your health care provider or pharmacist if you have questions. COMMON BRAND NAME(S): INFLECTRA, Remicade, RENFLEXIS What should I tell my health care provider before I take this medicine? They need to know if you have any of these conditions: -cancer -current or past resident of [place] or [place] -diabetes -exposure to tuberculosis -Guillain-Barre syndrome -heart failure -hepatitis or liver disease -immune system problems -infection -lung or breathing disease, like COPD -multiple sclerosis -receiving phototherapy for the skin -seizure disorder -an unusual or allergic reaction to infliximab, mouse proteins, other medicines, foods, dyes, or preservatives -pregnant or trying to get pregnant -breast-feeding How should I use this medicine? This medicine is for injection into a vein. It is usually given by a health care professional in a hospital or clinic setting. A special MedGuide will be given to you by the pharmacist with each prescription and refill. Be sure to read this information carefully each time. Talk to your pediatrician regarding the use of this medicine in children. While this drug may be prescribed for children as young as 72 years of age for selected conditions, precautions do apply. Overdosage: If you think you have taken too much of this medicine contact a poison control center or emergency room at once. NOTE: This medicine is only for you. Do not share this medicine with others. What if I miss a dose? It is important not to miss your dose. Call your doctor or health care professional if you are unable to keep an appointment. What may interact with this medicine? Do not take this  medicine with any of the following medications: -biologic medicines such as abatacept, adalimumab, anakinra, certolizumab, etanercept, golimumab, rituximab, secukinumab, tocilizumab, tofactinib, ustekinumab -live vaccines This list may not describe all possible interactions. Give your health care provider a list of all the medicines, herbs, non-prescription drugs, or dietary supplements you use. Also tell them if you smoke, drink alcohol, or use illegal drugs. Some items may interact with your medicine. What should I watch for while using this medicine? Your condition will be monitored carefully while you are receiving this medicine. Visit your doctor or health care professional for regular checks on your progress. You may need blood work done while you are taking this medicine. Before beginning therapy, your doctor may do a test to see if you have been exposed to tuberculosis. Call your doctor or health care professional for advice if you get a fever, chills or sore throat, or other symptoms of a cold or flu. Do not treat yourself. This drug decreases your body's ability to fight infections. Try to avoid being around people who are sick. This medicine may make the symptoms of heart failure worse in some patients. If you notice symptoms such as increased shortness of breath or swelling of the ankles or legs, contact your health care provider right away. If you are going to have surgery or dental work, tell your health care professional or dentist that you have received this medicine. If you take this medicine for plaque psoriasis, stay out of the sun. If you cannot avoid being in the sun, wear protective clothing and use sunscreen. Do not use sun lamps or tanning beds/booths. Talk to your doctor about your risk of cancer. You may be  more at risk for certain types of cancers if you take this medicine. What side effects may I notice from receiving this medicine? Side effects that you should report to your  doctor or health care professional as soon as possible: -allergic reactions like skin rash, itching or hives, swelling of the face, lips, or tongue -breathing problems -changes in vision -chest pain -fever or chills, usually related to the infusion -joint pain -pain, tingling, numbness in the hands or feet -redness, blistering, peeling or loosening of the skin, including inside the mouth -seizures -signs of infection - fever or chills, cough, sore throat, flu-like symptoms, pain or difficulty passing urine -signs and symptoms of liver injury like dark yellow or brown urine; general ill feeling; light-colored stools; loss of appetite; nausea; right upper belly pain; unusually weak or tired; yellowing of the eyes or skin -signs and symptoms of a stroke like changes in vision; confusion; trouble speaking or understanding; severe headaches; sudden numbness or weakness of the face, arm or leg; trouble walking; dizziness; loss of balance or coordination -swelling of the ankles, feet, or hands -swollen lymph nodes in the neck, underarm, or groin areas -unusual bleeding or bruising -unusually weak or tired Side effects that usually do not require medical attention (report to your doctor or health care professional if they continue or are bothersome): -headache -nausea -stomach pain -upset stomach This list may not describe all possible side effects. Call your doctor for medical advice about side effects. You may report side effects to FDA at 1-800-FDA-1088. Where should I keep my medicine? This drug is given in a hospital or clinic and will not be stored at home. NOTE: This sheet is a summary. It may not cover all possible information. If you have questions about this medicine, talk to your doctor, pharmacist, or health care provider.  2019 Elsevier/Gold Standard (2016-09-10 13:45:32)

## 2018-10-19 NOTE — Progress Notes (Signed)
requesting medication review to change benadryl from po to IV due to pts difficulty with swallowing pills

## 2018-10-20 ENCOUNTER — Other Ambulatory Visit: Payer: Self-pay

## 2018-10-20 DIAGNOSIS — K5000 Crohn's disease of small intestine without complications: Secondary | ICD-10-CM

## 2018-11-10 ENCOUNTER — Telehealth: Payer: Self-pay

## 2018-11-10 NOTE — Telephone Encounter (Signed)
Covid-19 travel screening questions  Have you traveled in the last 14 days? NO  If yes where?  Do you now or have you had a fever in the last 14 days? NO  Do you have any respiratory symptoms of shortness of breath or cough now or in the last 14 days? NO   Do you have any family members or close contacts with diagnosed or suspected Covid-19? YES- Father    The pt was rescheduled to 12/10/18.  He has no complaints at this time.  He was being seen for follow-up.

## 2018-11-11 ENCOUNTER — Ambulatory Visit: Payer: Medicaid Other | Admitting: Internal Medicine

## 2018-12-08 ENCOUNTER — Other Ambulatory Visit (HOSPITAL_COMMUNITY): Payer: Self-pay | Admitting: General Practice

## 2018-12-08 ENCOUNTER — Encounter: Payer: Self-pay | Admitting: General Surgery

## 2018-12-09 ENCOUNTER — Other Ambulatory Visit: Payer: Self-pay | Admitting: Internal Medicine

## 2018-12-09 ENCOUNTER — Ambulatory Visit: Payer: Medicaid Other | Admitting: Internal Medicine

## 2018-12-09 ENCOUNTER — Encounter: Payer: Self-pay | Admitting: Internal Medicine

## 2018-12-09 ENCOUNTER — Other Ambulatory Visit: Payer: Self-pay

## 2018-12-09 ENCOUNTER — Ambulatory Visit (INDEPENDENT_AMBULATORY_CARE_PROVIDER_SITE_OTHER): Payer: Medicaid Other | Admitting: Internal Medicine

## 2018-12-09 DIAGNOSIS — K5000 Crohn's disease of small intestine without complications: Secondary | ICD-10-CM

## 2018-12-09 DIAGNOSIS — Z7960 Long term (current) use of unspecified immunomodulators and immunosuppressants: Secondary | ICD-10-CM

## 2018-12-09 DIAGNOSIS — Z79899 Other long term (current) drug therapy: Secondary | ICD-10-CM | POA: Diagnosis not present

## 2018-12-09 NOTE — Patient Instructions (Signed)
It was good to talk to you today.  I am glad you are feeling well.  Continue to take your Remicade and I should see you in about 6 months.  In August we need to recheck your blood with a QuantiFERON test.  Let me know if anything comes up in between and I hope you stay well.  I appreciate the opportunity to care for you. [name], MD, [facility]

## 2018-12-09 NOTE — Progress Notes (Signed)
    TELEHEALTH ENCOUNTER IN SETTING OF COVID-19 PANDEMIC - REQUESTED BY PATIENT SERVICE PROVIDED BY TELEMEDECINE - TYPE: Failed Zoom (phone) PATIENT LOCATION: Home PATIENT HAS CONSENTED TO TELEHEALTH VISIT PROVIDER LOCATION: OFFICE PARTICIPANTS OTHER THAN PATIENT:None TIME SPENT ON CALL:10 mins    Philip Joseph 27 y.o. 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  Long-term use of immunosuppressant medication- Remicade No signs of problems.  He needs QuantiFERON testing in August.      Subjective:   Chief Complaint: Follow-up of Crohn's disease on Remicade  HPI Patient reports that he feels stable and doing well on his Remicade every 8 weeks.  No significant abdominal pain diarrhea or rectal bleeding.  No complications noted. Allergies  Allergen Reactions  . Penicillins Rash    Has patient had a PCN reaction causing immediate rash, facial/tongue/throat swelling, SOB or lightheadedness with hypotension: No Has patient had a PCN reaction causing severe rash involving mucus membranes or skin necrosis: No Has patient had a PCN reaction that required hospitalization No Has patient had a PCN reaction occurring within the last 10 years: No If all of the above answers are "NO", then may proceed with Cephalosporin use.    Current Meds  Medication Sig  . dicyclomine (BENTYL) 20 MG tablet Take 1 tablet (20 mg total) by mouth 2 (two) times daily as needed for spasms.  . InFLIXimab (REMICADE IV) Inject 300 mg into the vein every 8 (eight) weeks.   . ondansetron (ZOFRAN ODT) 4 MG disintegrating tablet Take 1 tablet (4 mg total) by mouth every 8 (eight) hours as needed for nausea or vomiting.   Past Medical History:  Diagnosis Date  . ADHD (attention  deficit hyperactivity disorder)   . Crohn's ileitis ([place]) 12/19/2013  . IBS (irritable bowel syndrome)   . Long term current use of systemic steroids 11/27/2015  . 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 Unspecified vitamin D deficiency 12/21/2013   Past Surgical History:  Procedure Laterality Date  . COLONOSCOPY N/A 12/20/2013   Procedure: COLONOSCOPY;  Surgeon: [name], MD;  Location: [facility];  Service: Endoscopy;  Laterality: N/A;  . COLONOSCOPY    . WRIST FRACTURE SURGERY     Social History   Social History Narrative   Single, 1 son born 2018.   Lives w/ baby's mother and son   + Smoker of tobacco and marijuana   Disability ?   family history includes Prostate cancer in his father.   Review of Systems As per HPI

## 2018-12-09 NOTE — Assessment & Plan Note (Addendum)
It sounds like he remains in clinical remission on his Remicade we will continue and I should see him again in about 6 months.  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

## 2018-12-09 NOTE — Assessment & Plan Note (Addendum)
No signs of problems.  He needs QuantiFERON testing in August.

## 2018-12-10 ENCOUNTER — Ambulatory Visit: Payer: Medicaid Other | Admitting: Internal Medicine

## 2018-12-22 ENCOUNTER — Ambulatory Visit (HOSPITAL_COMMUNITY): Payer: Medicaid Other

## 2018-12-22 ENCOUNTER — Encounter (HOSPITAL_COMMUNITY): Payer: Medicaid Other

## 2018-12-27 ENCOUNTER — Other Ambulatory Visit: Payer: Self-pay

## 2018-12-27 ENCOUNTER — Ambulatory Visit (HOSPITAL_COMMUNITY)
Admission: RE | Admit: 2018-12-27 | Discharge: 2018-12-27 | Disposition: A | Discharge status: Home / Self Care | Payer: Medicaid Other | Source: Ambulatory Visit | Attending: Internal Medicine | Admitting: Internal Medicine

## 2018-12-27 DIAGNOSIS — K5000 Crohn's disease of small intestine without complications: Secondary | ICD-10-CM | POA: Diagnosis present

## 2018-12-27 MED ORDER — ACETAMINOPHEN 325 MG PO TABS
650.0000 mg | ORAL_TABLET | Freq: Every day | ORAL | Status: DC
  Administered 2018-12-27: 650 mg via ORAL
  Filled 2018-12-27: qty 2

## 2018-12-27 MED ORDER — DIPHENHYDRAMINE HCL 25 MG PO CAPS
50.0000 mg | ORAL_CAPSULE | Freq: Every day | ORAL | Status: DC
  Administered 2018-12-27: 50 mg via ORAL
  Filled 2018-12-27: qty 2

## 2018-12-27 MED ORDER — SODIUM CHLORIDE 0.9 % IV SOLN
INTRAVENOUS | Status: DC
  Administered 2018-12-27: 12:00:00 via INTRAVENOUS

## 2018-12-27 MED ORDER — SODIUM CHLORIDE 0.9 % IV SOLN
5.0000 mg/kg | INTRAVENOUS | Status: AC
  Administered 2018-12-27: 300 mg via INTRAVENOUS
  Filled 2018-12-27: qty 30

## 2018-12-27 NOTE — Discharge Instructions (Signed)
Infliximab injection What is this medicine? INFLIXIMAB (in [place] i mab) is used to treat Crohn's disease and ulcerative colitis. It is also used to treat ankylosing spondylitis, plaque psoriasis, and some forms of arthritis. This medicine may be used for other purposes; ask your health care provider or pharmacist if you have questions. COMMON BRAND NAME(S): INFLECTRA, Remicade, RENFLEXIS What should I tell my health care provider before I take this medicine? They need to know if you have any of these conditions: -cancer -current or past resident of [place] or [place] -diabetes -exposure to tuberculosis -Guillain-Barre syndrome -heart failure -hepatitis or liver disease -immune system problems -infection -lung or breathing disease, like COPD -multiple sclerosis -receiving phototherapy for the skin -seizure disorder -an unusual or allergic reaction to infliximab, mouse proteins, other medicines, foods, dyes, or preservatives -pregnant or trying to get pregnant -breast-feeding How should I use this medicine? This medicine is for injection into a vein. It is usually given by a health care professional in a hospital or clinic setting. A special MedGuide will be given to you by the pharmacist with each prescription and refill. Be sure to read this information carefully each time. Talk to your pediatrician regarding the use of this medicine in children. While this drug may be prescribed for children as young as 45 years of age for selected conditions, precautions do apply. Overdosage: If you think you have taken too much of this medicine contact a poison control center or emergency room at once. NOTE: This medicine is only for you. Do not share this medicine with others. What if I miss a dose? It is important not to miss your dose. Call your doctor or health care professional if you are unable to keep an appointment. What may interact with this medicine? Do not take this  medicine with any of the following medications: -biologic medicines such as abatacept, adalimumab, anakinra, certolizumab, etanercept, golimumab, rituximab, secukinumab, tocilizumab, tofactinib, ustekinumab -live vaccines This list may not describe all possible interactions. Give your health care provider a list of all the medicines, herbs, non-prescription drugs, or dietary supplements you use. Also tell them if you smoke, drink alcohol, or use illegal drugs. Some items may interact with your medicine. What should I watch for while using this medicine? Your condition will be monitored carefully while you are receiving this medicine. Visit your doctor or health care professional for regular checks on your progress. You may need blood work done while you are taking this medicine. Before beginning therapy, your doctor may do a test to see if you have been exposed to tuberculosis. Call your doctor or health care professional for advice if you get a fever, chills or sore throat, or other symptoms of a cold or flu. Do not treat yourself. This drug decreases your body's ability to fight infections. Try to avoid being around people who are sick. This medicine may make the symptoms of heart failure worse in some patients. If you notice symptoms such as increased shortness of breath or swelling of the ankles or legs, contact your health care provider right away. If you are going to have surgery or dental work, tell your health care professional or dentist that you have received this medicine. If you take this medicine for plaque psoriasis, stay out of the sun. If you cannot avoid being in the sun, wear protective clothing and use sunscreen. Do not use sun lamps or tanning beds/booths. Talk to your doctor about your risk of cancer. You may be  more at risk for certain types of cancers if you take this medicine. What side effects may I notice from receiving this medicine? Side effects that you should report to your  doctor or health care professional as soon as possible: -allergic reactions like skin rash, itching or hives, swelling of the face, lips, or tongue -breathing problems -changes in vision -chest pain -fever or chills, usually related to the infusion -joint pain -pain, tingling, numbness in the hands or feet -redness, blistering, peeling or loosening of the skin, including inside the mouth -seizures -signs of infection - fever or chills, cough, sore throat, flu-like symptoms, pain or difficulty passing urine -signs and symptoms of liver injury like dark yellow or brown urine; general ill feeling; light-colored stools; loss of appetite; nausea; right upper belly pain; unusually weak or tired; yellowing of the eyes or skin -signs and symptoms of a stroke like changes in vision; confusion; trouble speaking or understanding; severe headaches; sudden numbness or weakness of the face, arm or leg; trouble walking; dizziness; loss of balance or coordination -swelling of the ankles, feet, or hands -swollen lymph nodes in the neck, underarm, or groin areas -unusual bleeding or bruising -unusually weak or tired Side effects that usually do not require medical attention (report to your doctor or health care professional if they continue or are bothersome): -headache -nausea -stomach pain -upset stomach This list may not describe all possible side effects. Call your doctor for medical advice about side effects. You may report side effects to FDA at 1-800-FDA-1088. Where should I keep my medicine? This drug is given in a hospital or clinic and will not be stored at home. NOTE: This sheet is a summary. It may not cover all possible information. If you have questions about this medicine, talk to your doctor, pharmacist, or health care provider.  2019 Elsevier/Gold Standard (2016-09-10 13:45:32)

## 2018-12-27 NOTE — Progress Notes (Addendum)
PATIENT CARE CENTER NOTE  Diagnosis: Crohn's disease of ileum without complication ([address]) (H53.91)  Provider: Silvano Rusk, MD   Procedure: Remicade IV   Note: Patient received Remicade infusion. Infusion titrated per protocol. Patient tolerated well with no adverse reaction. Vital signs stable. Discharge instructions given. Patient alert, oriented and ambulatory at discharge.

## 2019-02-15 ENCOUNTER — Encounter (HOSPITAL_COMMUNITY): Payer: Medicaid Other

## 2019-02-21 ENCOUNTER — Encounter (HOSPITAL_COMMUNITY): Payer: Medicaid Other

## 2019-02-23 ENCOUNTER — Ambulatory Visit (HOSPITAL_COMMUNITY)
Admission: RE | Admit: 2019-02-23 | Discharge: 2019-02-23 | Disposition: A | Discharge status: Home / Self Care | Payer: Medicaid Other | Source: Ambulatory Visit | Attending: Internal Medicine | Admitting: Internal Medicine

## 2019-02-23 ENCOUNTER — Other Ambulatory Visit: Payer: Self-pay

## 2019-02-23 ENCOUNTER — Telehealth: Payer: Self-pay | Admitting: Internal Medicine

## 2019-02-23 DIAGNOSIS — K5000 Crohn's disease of small intestine without complications: Secondary | ICD-10-CM | POA: Diagnosis present

## 2019-02-23 MED ORDER — DIPHENHYDRAMINE HCL 25 MG PO CAPS
50.0000 mg | ORAL_CAPSULE | Freq: Once | ORAL | Status: AC
  Administered 2019-02-23: 50 mg via ORAL
  Filled 2019-02-23: qty 2

## 2019-02-23 MED ORDER — SODIUM CHLORIDE 0.9 % IV SOLN
INTRAVENOUS | Status: DC | PRN
  Administered 2019-02-23: 250 mL via INTRAVENOUS

## 2019-02-23 MED ORDER — ONDANSETRON 4 MG PO TBDP: 4.0000 mg | ORAL_TABLET | Freq: Three times a day (TID) | ORAL | 5 refills | Status: DC | PRN

## 2019-02-23 MED ORDER — DICYCLOMINE HCL 20 MG PO TABS: 20.0000 mg | ORAL_TABLET | Freq: Two times a day (BID) | ORAL | 5 refills | Status: DC | PRN

## 2019-02-23 MED ORDER — ACETAMINOPHEN 325 MG PO TABS
650.0000 mg | ORAL_TABLET | Freq: Once | ORAL | Status: AC
  Administered 2019-02-23: 650 mg via ORAL
  Filled 2019-02-23: qty 2

## 2019-02-23 MED ORDER — SODIUM CHLORIDE 0.9 % IV SOLN
300.0000 mg | INTRAVENOUS | Status: DC
  Administered 2019-02-23: 300 mg via INTRAVENOUS
  Filled 2019-02-23: qty 30

## 2019-02-23 NOTE — Telephone Encounter (Signed)
I left Philip Joseph a detailed message that his rx's have been sent in as requested.

## 2019-02-23 NOTE — Telephone Encounter (Signed)
Please advise Sir, thank you. 

## 2019-02-23 NOTE — Telephone Encounter (Signed)
Refill x 6

## 2019-02-23 NOTE — Progress Notes (Signed)
PATIENT CARE CENTER NOTE  Diagnosis: Crohn's disease of ileum without complication ([place]) (L29.57)  Provider: Silvano Rusk, MD   Procedure: Remicade IV   Note: Patient received Remicade infusion. Pre-medications given per order. Infusion titrated per protocol. Patient tolerated well with no adverse reaction. Vital signs stable. Discharge instructions given. Patient alert, oriented and ambulatory at discharge.

## 2019-02-23 NOTE — Discharge Instructions (Signed)
Infliximab injection What is this medicine? INFLIXIMAB (in [place] i mab) is used to treat Crohn's disease and ulcerative colitis. It is also used to treat ankylosing spondylitis, plaque psoriasis, and some forms of arthritis. This medicine may be used for other purposes; ask your health care provider or pharmacist if you have questions. COMMON BRAND NAME(S): INFLECTRA, Remicade, RENFLEXIS What should I tell my health care provider before I take this medicine? They need to know if you have any of these conditions:  cancer  current or past resident of [place] or [place]  diabetes  exposure to tuberculosis  Guillain-Barre syndrome  heart failure  hepatitis or liver disease  immune system problems  infection  lung or breathing disease, like COPD  multiple sclerosis  receiving phototherapy for the skin  seizure disorder  an unusual or allergic reaction to infliximab, mouse proteins, other medicines, foods, dyes, or preservatives  pregnant or trying to get pregnant  breast-feeding How should I use this medicine? This medicine is for injection into a vein. It is usually given by a health care professional in a hospital or clinic setting. A special MedGuide will be given to you by the pharmacist with each prescription and refill. Be sure to read this information carefully each time. Talk to your pediatrician regarding the use of this medicine in children. While this drug may be prescribed for children as young as 20 years of age for selected conditions, precautions do apply. Overdosage: If you think you have taken too much of this medicine contact a poison control center or emergency room at once. NOTE: This medicine is only for you. Do not share this medicine with others. What if I miss a dose? It is important not to miss your dose. Call your doctor or health care professional if you are unable to keep an appointment. What may interact with this medicine? Do not  take this medicine with any of the following medications:  biologic medicines such as abatacept, adalimumab, anakinra, certolizumab, etanercept, golimumab, rituximab, secukinumab, tocilizumab, tofactinib, ustekinumab  live vaccines This list may not describe all possible interactions. Give your health care provider a list of all the medicines, herbs, non-prescription drugs, or dietary supplements you use. Also tell them if you smoke, drink alcohol, or use illegal drugs. Some items may interact with your medicine. What should I watch for while using this medicine? Your condition will be monitored carefully while you are receiving this medicine. Visit your doctor or health care professional for regular checks on your progress. You may need blood work done while you are taking this medicine. Before beginning therapy, your doctor may do a test to see if you have been exposed to tuberculosis. Call your doctor or health care professional for advice if you get a fever, chills or sore throat, or other symptoms of a cold or flu. Do not treat yourself. This drug decreases your body's ability to fight infections. Try to avoid being around people who are sick. This medicine may make the symptoms of heart failure worse in some patients. If you notice symptoms such as increased shortness of breath or swelling of the ankles or legs, contact your health care provider right away. If you are going to have surgery or dental work, tell your health care professional or dentist that you have received this medicine. If you take this medicine for plaque psoriasis, stay out of the sun. If you cannot avoid being in the sun, wear protective clothing and use sunscreen. Do not  use sun lamps or tanning beds/booths. Talk to your doctor about your risk of cancer. You may be more at risk for certain types of cancers if you take this medicine. What side effects may I notice from receiving this medicine? Side effects that you should  report to your doctor or health care professional as soon as possible:  allergic reactions like skin rash, itching or hives, swelling of the face, lips, or tongue  breathing problems  changes in vision  chest pain  fever or chills, usually related to the infusion  joint pain  pain, tingling, numbness in the hands or feet  redness, blistering, peeling or loosening of the skin, including inside the mouth  seizures  signs of infection - fever or chills, cough, sore throat, flu-like symptoms, pain or difficulty passing urine  signs and symptoms of liver injury like dark yellow or brown urine; general ill feeling; light-colored stools; loss of appetite; nausea; right upper belly pain; unusually weak or tired; yellowing of the eyes or skin  signs and symptoms of a stroke like changes in vision; confusion; trouble speaking or understanding; severe headaches; sudden numbness or weakness of the face, arm or leg; trouble walking; dizziness; loss of balance or coordination  swelling of the ankles, feet, or hands  swollen lymph nodes in the neck, underarm, or groin areas  unusual bleeding or bruising  unusually weak or tired Side effects that usually do not require medical attention (report to your doctor or health care professional if they continue or are bothersome):  headache  nausea  stomach pain  upset stomach This list may not describe all possible side effects. Call your doctor for medical advice about side effects. You may report side effects to FDA at 1-800-FDA-1088. Where should I keep my medicine? This drug is given in a hospital or clinic and will not be stored at home. NOTE: This sheet is a summary. It may not cover all possible information. If you have questions about this medicine, talk to your doctor, pharmacist, or health care provider.  2020 Elsevier/Gold Standard (2016-09-10 13:45:32)

## 2019-04-20 ENCOUNTER — Ambulatory Visit (HOSPITAL_COMMUNITY)
Admission: RE | Admit: 2019-04-20 | Discharge: 2019-04-20 | Disposition: A | Discharge status: Home / Self Care | Payer: Medicaid Other | Source: Ambulatory Visit | Attending: Internal Medicine | Admitting: Internal Medicine

## 2019-04-20 ENCOUNTER — Other Ambulatory Visit: Payer: Self-pay

## 2019-04-20 DIAGNOSIS — K5000 Crohn's disease of small intestine without complications: Secondary | ICD-10-CM | POA: Insufficient documentation

## 2019-04-20 MED ORDER — DIPHENHYDRAMINE HCL 25 MG PO CAPS
50.0000 mg | ORAL_CAPSULE | Freq: Once | ORAL | Status: AC
  Administered 2019-04-20: 10:00:00 50 mg via ORAL
  Filled 2019-04-20: qty 2

## 2019-04-20 MED ORDER — ACETAMINOPHEN 325 MG PO TABS
650.0000 mg | ORAL_TABLET | Freq: Once | ORAL | Status: AC
  Administered 2019-04-20: 650 mg via ORAL
  Filled 2019-04-20: qty 2

## 2019-04-20 MED ORDER — SODIUM CHLORIDE 0.9 % IV SOLN
5.0000 mg/kg | INTRAVENOUS | Status: DC
  Administered 2019-04-20: 300 mg via INTRAVENOUS
  Filled 2019-04-20: qty 30

## 2019-04-20 MED ORDER — SODIUM CHLORIDE 0.9 % IV SOLN
INTRAVENOUS | Status: DC | PRN
  Administered 2019-04-20: 250 mL via INTRAVENOUS

## 2019-04-20 NOTE — Discharge Instructions (Signed)
Infliximab injection What is this medicine? INFLIXIMAB (in [place] i mab) is used to treat Crohn's disease and ulcerative colitis. It is also used to treat ankylosing spondylitis, plaque psoriasis, and some forms of arthritis. This medicine may be used for other purposes; ask your health care provider or pharmacist if you have questions. COMMON BRAND NAME(S): INFLECTRA, Remicade, RENFLEXIS What should I tell my health care provider before I take this medicine? They need to know if you have any of these conditions:  cancer  current or past resident of [place] or [place]  diabetes  exposure to tuberculosis  Guillain-Barre syndrome  heart failure  hepatitis or liver disease  immune system problems  infection  lung or breathing disease, like COPD  multiple sclerosis  receiving phototherapy for the skin  seizure disorder  an unusual or allergic reaction to infliximab, mouse proteins, other medicines, foods, dyes, or preservatives  pregnant or trying to get pregnant  breast-feeding How should I use this medicine? This medicine is for injection into a vein. It is usually given by a health care professional in a hospital or clinic setting. A special MedGuide will be given to you by the pharmacist with each prescription and refill. Be sure to read this information carefully each time. Talk to your pediatrician regarding the use of this medicine in children. While this drug may be prescribed for children as young as 63 years of age for selected conditions, precautions do apply. Overdosage: If you think you have taken too much of this medicine contact a poison control center or emergency room at once. NOTE: This medicine is only for you. Do not share this medicine with others. What if I miss a dose? It is important not to miss your dose. Call your doctor or health care professional if you are unable to keep an appointment. What may interact with this medicine? Do not  take this medicine with any of the following medications:  biologic medicines such as abatacept, adalimumab, anakinra, certolizumab, etanercept, golimumab, rituximab, secukinumab, tocilizumab, tofactinib, ustekinumab  live vaccines This list may not describe all possible interactions. Give your health care provider a list of all the medicines, herbs, non-prescription drugs, or dietary supplements you use. Also tell them if you smoke, drink alcohol, or use illegal drugs. Some items may interact with your medicine. What should I watch for while using this medicine? Your condition will be monitored carefully while you are receiving this medicine. Visit your doctor or health care professional for regular checks on your progress. You may need blood work done while you are taking this medicine. Before beginning therapy, your doctor may do a test to see if you have been exposed to tuberculosis. Call your doctor or health care professional for advice if you get a fever, chills or sore throat, or other symptoms of a cold or flu. Do not treat yourself. This drug decreases your body's ability to fight infections. Try to avoid being around people who are sick. This medicine may make the symptoms of heart failure worse in some patients. If you notice symptoms such as increased shortness of breath or swelling of the ankles or legs, contact your health care provider right away. If you are going to have surgery or dental work, tell your health care professional or dentist that you have received this medicine. If you take this medicine for plaque psoriasis, stay out of the sun. If you cannot avoid being in the sun, wear protective clothing and use sunscreen. Do not  use sun lamps or tanning beds/booths. Talk to your doctor about your risk of cancer. You may be more at risk for certain types of cancers if you take this medicine. What side effects may I notice from receiving this medicine? Side effects that you should  report to your doctor or health care professional as soon as possible:  allergic reactions like skin rash, itching or hives, swelling of the face, lips, or tongue  breathing problems  changes in vision  chest pain  fever or chills, usually related to the infusion  joint pain  pain, tingling, numbness in the hands or feet  redness, blistering, peeling or loosening of the skin, including inside the mouth  seizures  signs of infection - fever or chills, cough, sore throat, flu-like symptoms, pain or difficulty passing urine  signs and symptoms of liver injury like dark yellow or brown urine; general ill feeling; light-colored stools; loss of appetite; nausea; right upper belly pain; unusually weak or tired; yellowing of the eyes or skin  signs and symptoms of a stroke like changes in vision; confusion; trouble speaking or understanding; severe headaches; sudden numbness or weakness of the face, arm or leg; trouble walking; dizziness; loss of balance or coordination  swelling of the ankles, feet, or hands  swollen lymph nodes in the neck, underarm, or groin areas  unusual bleeding or bruising  unusually weak or tired Side effects that usually do not require medical attention (report to your doctor or health care professional if they continue or are bothersome):  headache  nausea  stomach pain  upset stomach This list may not describe all possible side effects. Call your doctor for medical advice about side effects. You may report side effects to FDA at 1-800-FDA-1088. Where should I keep my medicine? This drug is given in a hospital or clinic and will not be stored at home. NOTE: This sheet is a summary. It may not cover all possible information. If you have questions about this medicine, talk to your doctor, pharmacist, or health care provider.  2020 Elsevier/Gold Standard (2016-09-10 13:45:32)

## 2019-04-20 NOTE — Progress Notes (Signed)
PATIENT CARE CENTER NOTE  Diagnosis:Crohn's disease of ileum without complication ([place]) (N27.78)   Provider:Gessner, Glendell Docker, MD   Procedure:Remicade IV   Note:Patient received Remicade infusion. Pre-medications given per order. Infusion titrated per protocol. Patient tolerated well with no adverse reaction. Vital signs stable. Discharge instructions given. Patient alert, oriented and ambulatory at discharge.

## 2019-06-15 ENCOUNTER — Ambulatory Visit (HOSPITAL_COMMUNITY)
Admission: RE | Admit: 2019-06-15 | Discharge: 2019-06-15 | Disposition: A | Discharge status: Home / Self Care | Payer: Medicaid Other | Source: Ambulatory Visit | Attending: Internal Medicine | Admitting: Internal Medicine

## 2019-06-15 ENCOUNTER — Other Ambulatory Visit: Payer: Self-pay

## 2019-06-15 DIAGNOSIS — K5000 Crohn's disease of small intestine without complications: Secondary | ICD-10-CM | POA: Diagnosis present

## 2019-06-15 MED ORDER — SODIUM CHLORIDE 0.9 % IV SOLN
INTRAVENOUS | Status: DC | PRN
  Administered 2019-06-15: 250 mL via INTRAVENOUS

## 2019-06-15 MED ORDER — DIPHENHYDRAMINE HCL 25 MG PO CAPS
50.0000 mg | ORAL_CAPSULE | Freq: Once | ORAL | Status: AC
  Administered 2019-06-15: 11:00:00 50 mg via ORAL
  Filled 2019-06-15: qty 2

## 2019-06-15 MED ORDER — SODIUM CHLORIDE 0.9 % IV SOLN
5.0000 mg/kg | Freq: Once | INTRAVENOUS | Status: AC
  Administered 2019-06-15: 11:00:00 300 mg via INTRAVENOUS
  Filled 2019-06-15: qty 30

## 2019-06-15 MED ORDER — ACETAMINOPHEN 325 MG PO TABS
650.0000 mg | ORAL_TABLET | Freq: Once | ORAL | Status: AC
  Administered 2019-06-15: 650 mg via ORAL
  Filled 2019-06-15: qty 2

## 2019-06-15 NOTE — Discharge Instructions (Signed)
Infliximab injection What is this medicine? INFLIXIMAB (in [place] i mab) is used to treat Crohn's disease and ulcerative colitis. It is also used to treat ankylosing spondylitis, plaque psoriasis, and some forms of arthritis. This medicine may be used for other purposes; ask your health care provider or pharmacist if you have questions. COMMON BRAND NAME(S): INFLECTRA, Remicade, RENFLEXIS What should I tell my health care provider before I take this medicine? They need to know if you have any of these conditions:  cancer  current or past resident of [place] or [place]  diabetes  exposure to tuberculosis  Guillain-Barre syndrome  heart failure  hepatitis or liver disease  immune system problems  infection  lung or breathing disease, like COPD  multiple sclerosis  receiving phototherapy for the skin  seizure disorder  an unusual or allergic reaction to infliximab, mouse proteins, other medicines, foods, dyes, or preservatives  pregnant or trying to get pregnant  breast-feeding How should I use this medicine? This medicine is for injection into a vein. It is usually given by a health care professional in a hospital or clinic setting. A special MedGuide will be given to you by the pharmacist with each prescription and refill. Be sure to read this information carefully each time. Talk to your pediatrician regarding the use of this medicine in children. While this drug may be prescribed for children as young as 39 years of age for selected conditions, precautions do apply. Overdosage: If you think you have taken too much of this medicine contact a poison control center or emergency room at once. NOTE: This medicine is only for you. Do not share this medicine with others. What if I miss a dose? It is important not to miss your dose. Call your doctor or health care professional if you are unable to keep an appointment. What may interact with this medicine? Do not  take this medicine with any of the following medications:  biologic medicines such as abatacept, adalimumab, anakinra, certolizumab, etanercept, golimumab, rituximab, secukinumab, tocilizumab, tofactinib, ustekinumab  live vaccines This list may not describe all possible interactions. Give your health care provider a list of all the medicines, herbs, non-prescription drugs, or dietary supplements you use. Also tell them if you smoke, drink alcohol, or use illegal drugs. Some items may interact with your medicine. What should I watch for while using this medicine? Your condition will be monitored carefully while you are receiving this medicine. Visit your doctor or health care professional for regular checks on your progress. You may need blood work done while you are taking this medicine. Before beginning therapy, your doctor may do a test to see if you have been exposed to tuberculosis. Call your doctor or health care professional for advice if you get a fever, chills or sore throat, or other symptoms of a cold or flu. Do not treat yourself. This drug decreases your body's ability to fight infections. Try to avoid being around people who are sick. This medicine may make the symptoms of heart failure worse in some patients. If you notice symptoms such as increased shortness of breath or swelling of the ankles or legs, contact your health care provider right away. If you are going to have surgery or dental work, tell your health care professional or dentist that you have received this medicine. If you take this medicine for plaque psoriasis, stay out of the sun. If you cannot avoid being in the sun, wear protective clothing and use sunscreen. Do not  use sun lamps or tanning beds/booths. Talk to your doctor about your risk of cancer. You may be more at risk for certain types of cancers if you take this medicine. What side effects may I notice from receiving this medicine? Side effects that you should  report to your doctor or health care professional as soon as possible:  allergic reactions like skin rash, itching or hives, swelling of the face, lips, or tongue  breathing problems  changes in vision  chest pain  fever or chills, usually related to the infusion  joint pain  pain, tingling, numbness in the hands or feet  redness, blistering, peeling or loosening of the skin, including inside the mouth  seizures  signs of infection - fever or chills, cough, sore throat, flu-like symptoms, pain or difficulty passing urine  signs and symptoms of liver injury like dark yellow or brown urine; general ill feeling; light-colored stools; loss of appetite; nausea; right upper belly pain; unusually weak or tired; yellowing of the eyes or skin  signs and symptoms of a stroke like changes in vision; confusion; trouble speaking or understanding; severe headaches; sudden numbness or weakness of the face, arm or leg; trouble walking; dizziness; loss of balance or coordination  swelling of the ankles, feet, or hands  swollen lymph nodes in the neck, underarm, or groin areas  unusual bleeding or bruising  unusually weak or tired Side effects that usually do not require medical attention (report to your doctor or health care professional if they continue or are bothersome):  headache  nausea  stomach pain  upset stomach This list may not describe all possible side effects. Call your doctor for medical advice about side effects. You may report side effects to FDA at 1-800-FDA-1088. Where should I keep my medicine? This drug is given in a hospital or clinic and will not be stored at home. NOTE: This sheet is a summary. It may not cover all possible information. If you have questions about this medicine, talk to your doctor, pharmacist, or health care provider.  2020 Elsevier/Gold Standard (2016-09-10 13:45:32)

## 2019-06-15 NOTE — Progress Notes (Signed)
PATIENT CARE CENTER NOTE  Diagnosis:Crohn's disease of ileum without complication ([place]) (P79.48)   Provider:Gessner, Glendell Docker, MD    Procedure:Remicade IV   Note:Patient received Remicade infusion.Pre-medications given per order. Patient tolerated well with no adverse reaction. Vital signs stable. Discharge instructions given. Patient alert, oriented and ambulatory at discharge.

## 2019-08-04 ENCOUNTER — Telehealth: Payer: Self-pay | Admitting: Internal Medicine

## 2019-08-04 MED ORDER — ONDANSETRON 4 MG PO TBDP: 4.0000 mg | ORAL_TABLET | Freq: Three times a day (TID) | ORAL | 2 refills | Status: DC | PRN

## 2019-08-04 MED ORDER — DICYCLOMINE HCL 20 MG PO TABS: 20.0000 mg | ORAL_TABLET | Freq: Two times a day (BID) | ORAL | 2 refills | Status: DC | PRN

## 2019-08-04 NOTE — Telephone Encounter (Signed)
I sent in the refills as requested and tried to let him know but his voicemail is full and not taking new messages.

## 2019-08-10 ENCOUNTER — Other Ambulatory Visit: Payer: Self-pay

## 2019-08-10 ENCOUNTER — Ambulatory Visit (HOSPITAL_COMMUNITY)
Admission: RE | Admit: 2019-08-10 | Discharge: 2019-08-10 | Disposition: A | Discharge status: Home / Self Care | Payer: Medicaid Other | Source: Ambulatory Visit | Attending: Internal Medicine | Admitting: Internal Medicine

## 2019-08-10 DIAGNOSIS — K5000 Crohn's disease of small intestine without complications: Secondary | ICD-10-CM | POA: Diagnosis present

## 2019-08-10 MED ORDER — SODIUM CHLORIDE 0.9 % IV SOLN
300.0000 mg | Freq: Once | INTRAVENOUS | Status: AC
  Administered 2019-08-10: 300 mg via INTRAVENOUS
  Filled 2019-08-10: qty 30

## 2019-08-10 MED ORDER — ACETAMINOPHEN 325 MG PO TABS
650.0000 mg | ORAL_TABLET | Freq: Once | ORAL | Status: AC
  Administered 2019-08-10: 10:00:00 650 mg via ORAL
  Filled 2019-08-10: qty 2

## 2019-08-10 MED ORDER — SODIUM CHLORIDE 0.9 % IV SOLN
INTRAVENOUS | Status: DC | PRN
  Administered 2019-08-10: 250 mL via INTRAVENOUS

## 2019-08-10 MED ORDER — DIPHENHYDRAMINE HCL 25 MG PO CAPS
50.0000 mg | ORAL_CAPSULE | Freq: Once | ORAL | Status: AC
  Administered 2019-08-10: 10:00:00 50 mg via ORAL
  Filled 2019-08-10: qty 2

## 2019-08-10 NOTE — Progress Notes (Signed)
                   Patient Care Center Note   Diagnosis: Crohn's disease of ileum without complication   Provider: Silvano Rusk, MD   Procedure: Remicade IV   Note: Patient received Remicade infusion via PIV. Pre-medications giver per order. Patient tolerated well. Vitals signs stable. Discharge instructions given. Patient alert, oriented and ambulatory at discharge.   Otho Bellows, RN

## 2019-08-10 NOTE — Discharge Instructions (Signed)
Infliximab injection What is this medicine? INFLIXIMAB (in [place] i mab) is used to treat Crohn's disease and ulcerative colitis. It is also used to treat ankylosing spondylitis, plaque psoriasis, and some forms of arthritis. This medicine may be used for other purposes; ask your health care provider or pharmacist if you have questions. COMMON BRAND NAME(S): INFLECTRA, Remicade, RENFLEXIS What should I tell my health care provider before I take this medicine? They need to know if you have any of these conditions:  cancer  current or past resident of [place] or [place]  diabetes  exposure to tuberculosis  Guillain-Barre syndrome  heart failure  hepatitis or liver disease  immune system problems  infection  lung or breathing disease, like COPD  multiple sclerosis  receiving phototherapy for the skin  seizure disorder  an unusual or allergic reaction to infliximab, mouse proteins, other medicines, foods, dyes, or preservatives  pregnant or trying to get pregnant  breast-feeding How should I use this medicine? This medicine is for injection into a vein. It is usually given by a health care professional in a hospital or clinic setting. A special MedGuide will be given to you by the pharmacist with each prescription and refill. Be sure to read this information carefully each time. Talk to your pediatrician regarding the use of this medicine in children. While this drug may be prescribed for children as young as 64 years of age for selected conditions, precautions do apply. Overdosage: If you think you have taken too much of this medicine contact a poison control center or emergency room at once. NOTE: This medicine is only for you. Do not share this medicine with others. What if I miss a dose? It is important not to miss your dose. Call your doctor or health care professional if you are unable to keep an appointment. What may interact with this medicine? Do not  take this medicine with any of the following medications:  biologic medicines such as abatacept, adalimumab, anakinra, certolizumab, etanercept, golimumab, rituximab, secukinumab, tocilizumab, tofactinib, ustekinumab  live vaccines This list may not describe all possible interactions. Give your health care provider a list of all the medicines, herbs, non-prescription drugs, or dietary supplements you use. Also tell them if you smoke, drink alcohol, or use illegal drugs. Some items may interact with your medicine. What should I watch for while using this medicine? Your condition will be monitored carefully while you are receiving this medicine. Visit your doctor or health care professional for regular checks on your progress. You may need blood work done while you are taking this medicine. Before beginning therapy, your doctor may do a test to see if you have been exposed to tuberculosis. Call your doctor or health care professional for advice if you get a fever, chills or sore throat, or other symptoms of a cold or flu. Do not treat yourself. This drug decreases your body's ability to fight infections. Try to avoid being around people who are sick. This medicine may make the symptoms of heart failure worse in some patients. If you notice symptoms such as increased shortness of breath or swelling of the ankles or legs, contact your health care provider right away. If you are going to have surgery or dental work, tell your health care professional or dentist that you have received this medicine. If you take this medicine for plaque psoriasis, stay out of the sun. If you cannot avoid being in the sun, wear protective clothing and use sunscreen. Do not  use sun lamps or tanning beds/booths. Talk to your doctor about your risk of cancer. You may be more at risk for certain types of cancers if you take this medicine. What side effects may I notice from receiving this medicine? Side effects that you should  report to your doctor or health care professional as soon as possible:  allergic reactions like skin rash, itching or hives, swelling of the face, lips, or tongue  breathing problems  changes in vision  chest pain  fever or chills, usually related to the infusion  joint pain  pain, tingling, numbness in the hands or feet  redness, blistering, peeling or loosening of the skin, including inside the mouth  seizures  signs of infection - fever or chills, cough, sore throat, flu-like symptoms, pain or difficulty passing urine  signs and symptoms of liver injury like dark yellow or brown urine; general ill feeling; light-colored stools; loss of appetite; nausea; right upper belly pain; unusually weak or tired; yellowing of the eyes or skin  signs and symptoms of a stroke like changes in vision; confusion; trouble speaking or understanding; severe headaches; sudden numbness or weakness of the face, arm or leg; trouble walking; dizziness; loss of balance or coordination  swelling of the ankles, feet, or hands  swollen lymph nodes in the neck, underarm, or groin areas  unusual bleeding or bruising  unusually weak or tired Side effects that usually do not require medical attention (report to your doctor or health care professional if they continue or are bothersome):  headache  nausea  stomach pain  upset stomach This list may not describe all possible side effects. Call your doctor for medical advice about side effects. You may report side effects to FDA at 1-800-FDA-1088. Where should I keep my medicine? This drug is given in a hospital or clinic and will not be stored at home. NOTE: This sheet is a summary. It may not cover all possible information. If you have questions about this medicine, talk to your doctor, pharmacist, or health care provider.  2020 Elsevier/Gold Standard (2016-09-10 13:45:32)

## 2019-10-05 ENCOUNTER — Ambulatory Visit (HOSPITAL_COMMUNITY)
Admission: RE | Admit: 2019-10-05 | Discharge: 2019-10-05 | Disposition: A | Discharge status: Home / Self Care | Payer: Medicaid Other | Source: Ambulatory Visit | Attending: Internal Medicine | Admitting: Internal Medicine

## 2019-10-05 ENCOUNTER — Other Ambulatory Visit: Payer: Self-pay

## 2019-10-05 DIAGNOSIS — K5000 Crohn's disease of small intestine without complications: Secondary | ICD-10-CM | POA: Diagnosis present

## 2019-10-05 MED ORDER — ACETAMINOPHEN 325 MG PO TABS
650.0000 mg | ORAL_TABLET | Freq: Once | ORAL | Status: AC
  Administered 2019-10-05: 09:00:00 650 mg via ORAL
  Filled 2019-10-05: qty 2

## 2019-10-05 MED ORDER — SODIUM CHLORIDE 0.9 % IV SOLN
INTRAVENOUS | Status: DC | PRN
  Administered 2019-10-05: 250 mL via INTRAVENOUS

## 2019-10-05 MED ORDER — SODIUM CHLORIDE 0.9 % IV SOLN
300.0000 mg | Freq: Once | INTRAVENOUS | Status: AC
  Administered 2019-10-05: 300 mg via INTRAVENOUS
  Filled 2019-10-05: qty 30

## 2019-10-05 MED ORDER — DIPHENHYDRAMINE HCL 25 MG PO CAPS
50.0000 mg | ORAL_CAPSULE | Freq: Once | ORAL | Status: AC
  Administered 2019-10-05: 09:00:00 50 mg via ORAL
  Filled 2019-10-05: qty 2

## 2019-10-05 NOTE — Progress Notes (Signed)
Pt received Remicade infusion. Upon completion VS stable, denies pain or discomfort. DC paper and education given, Pt discharged home

## 2019-10-05 NOTE — Discharge Instructions (Signed)
Infliximab injection What is this medicine? INFLIXIMAB (in [place] i mab) is used to treat Crohn's disease and ulcerative colitis. It is also used to treat ankylosing spondylitis, plaque psoriasis, and some forms of arthritis. This medicine may be used for other purposes; ask your health care provider or pharmacist if you have questions. COMMON BRAND NAME(S): AVSOLA, INFLECTRA, Remicade, RENFLEXIS What should I tell my health care provider before I take this medicine? They need to know if you have any of these conditions:  cancer  current or past resident of [place] or [place]  diabetes  exposure to tuberculosis  Guillain-Barre syndrome  heart failure  hepatitis or liver disease  immune system problems  infection  lung or breathing disease, like COPD  multiple sclerosis  receiving phototherapy for the skin  seizure disorder  an unusual or allergic reaction to infliximab, mouse proteins, other medicines, foods, dyes, or preservatives  pregnant or trying to get pregnant  breast-feeding How should I use this medicine? This medicine is for injection into a vein. It is usually given by a health care professional in a hospital or clinic setting. A special MedGuide will be given to you by the pharmacist with each prescription and refill. Be sure to read this information carefully each time. Talk to your pediatrician regarding the use of this medicine in children. While this drug may be prescribed for children as young as 31 years of age for selected conditions, precautions do apply. Overdosage: If you think you have taken too much of this medicine contact a poison control center or emergency room at once. NOTE: This medicine is only for you. Do not share this medicine with others. What if I miss a dose? It is important not to miss your dose. Call your doctor or health care professional if you are unable to keep an appointment. What may interact with this  medicine? Do not take this medicine with any of the following medications:  biologic medicines such as abatacept, adalimumab, anakinra, certolizumab, etanercept, golimumab, rituximab, secukinumab, tocilizumab, tofactinib, ustekinumab  live vaccines This list may not describe all possible interactions. Give your health care provider a list of all the medicines, herbs, non-prescription drugs, or dietary supplements you use. Also tell them if you smoke, drink alcohol, or use illegal drugs. Some items may interact with your medicine. What should I watch for while using this medicine? Your condition will be monitored carefully while you are receiving this medicine. Visit your doctor or health care professional for regular checks on your progress. You may need blood work done while you are taking this medicine. Before beginning therapy, your doctor may do a test to see if you have been exposed to tuberculosis. Call your doctor or health care professional for advice if you get a fever, chills or sore throat, or other symptoms of a cold or flu. Do not treat yourself. This drug decreases your body's ability to fight infections. Try to avoid being around people who are sick. This medicine may make the symptoms of heart failure worse in some patients. If you notice symptoms such as increased shortness of breath or swelling of the ankles or legs, contact your health care provider right away. If you are going to have surgery or dental work, tell your health care professional or dentist that you have received this medicine. If you take this medicine for plaque psoriasis, stay out of the sun. If you cannot avoid being in the sun, wear protective clothing and use sunscreen. Do  not use sun lamps or tanning beds/booths. Talk to your doctor about your risk of cancer. You may be more at risk for certain types of cancers if you take this medicine. What side effects may I notice from receiving this medicine? Side effects  that you should report to your doctor or health care professional as soon as possible:  allergic reactions like skin rash, itching or hives, swelling of the face, lips, or tongue  breathing problems  changes in vision  chest pain  fever or chills, usually related to the infusion  joint pain  pain, tingling, numbness in the hands or feet  redness, blistering, peeling or loosening of the skin, including inside the mouth  seizures  signs of infection - fever or chills, cough, sore throat, flu-like symptoms, pain or difficulty passing urine  signs and symptoms of liver injury like dark yellow or brown urine; general ill feeling; light-colored stools; loss of appetite; nausea; right upper belly pain; unusually weak or tired; yellowing of the eyes or skin  signs and symptoms of a stroke like changes in vision; confusion; trouble speaking or understanding; severe headaches; sudden numbness or weakness of the face, arm or leg; trouble walking; dizziness; loss of balance or coordination  swelling of the ankles, feet, or hands  swollen lymph nodes in the neck, underarm, or groin areas  unusual bleeding or bruising  unusually weak or tired Side effects that usually do not require medical attention (report to your doctor or health care professional if they continue or are bothersome):  headache  nausea  stomach pain  upset stomach This list may not describe all possible side effects. Call your doctor for medical advice about side effects. You may report side effects to FDA at 1-800-FDA-1088. Where should I keep my medicine? This drug is given in a hospital or clinic and will not be stored at home. NOTE: This sheet is a summary. It may not cover all possible information. If you have questions about this medicine, talk to your doctor, pharmacist, or health care provider.  2020 Elsevier/Gold Standard (2016-09-10 13:45:32)

## 2019-10-05 NOTE — Progress Notes (Signed)
PATIENT CARE CENTER NOTE  Diagnosis: Crohn's disease of ileum without complication   Provider: Silvano Rusk MD   Procedure: Remicade IV   Note: Patient received Remicade infusion. Pre medications given per order. Tolerated well, vitals stable, discharge instructions given, verbalized understanding. Patient alert, oriented and ambulatory at the time of discharge.

## 2019-11-29 ENCOUNTER — Other Ambulatory Visit: Payer: Self-pay

## 2019-11-29 ENCOUNTER — Ambulatory Visit (HOSPITAL_COMMUNITY)
Admission: RE | Admit: 2019-11-29 | Discharge: 2019-11-29 | Disposition: A | Discharge status: Home / Self Care | Payer: Medicaid Other | Source: Ambulatory Visit | Attending: Internal Medicine | Admitting: Internal Medicine

## 2019-11-29 DIAGNOSIS — K5000 Crohn's disease of small intestine without complications: Secondary | ICD-10-CM | POA: Insufficient documentation

## 2019-11-29 MED ORDER — SODIUM CHLORIDE 0.9 % IV SOLN
INTRAVENOUS | Status: DC | PRN
  Administered 2019-11-29: 10:00:00 250 mL via INTRAVENOUS

## 2019-11-29 MED ORDER — SODIUM CHLORIDE 0.9 % IV SOLN
5.0000 mg/kg | INTRAVENOUS | Status: DC
  Administered 2019-11-29: 300 mg via INTRAVENOUS
  Filled 2019-11-29: qty 30

## 2019-11-29 MED ORDER — ACETAMINOPHEN 325 MG PO TABS
650.0000 mg | ORAL_TABLET | Freq: Once | ORAL | Status: AC
  Administered 2019-11-29: 650 mg via ORAL
  Filled 2019-11-29: qty 2

## 2019-11-29 MED ORDER — DIPHENHYDRAMINE HCL 25 MG PO CAPS
50.0000 mg | ORAL_CAPSULE | Freq: Once | ORAL | Status: AC
  Administered 2019-11-29: 50 mg via ORAL
  Filled 2019-11-29: qty 2

## 2019-11-29 NOTE — Progress Notes (Signed)
PATIENT CARE CENTER NOTE  Diagnosis:Crohn's disease of ileum without complication ([place]) (P10.68)   Provider:Gessner, Glendell Docker, MD    Procedure:Remicade IV   Note:Patient received Remicade infusion via PIV.Pre-medications (Tylenol and Benadryl) given per order. Infusion titrated per protocol. Patient tolerated well with no adverse reaction. Vital signs stable. Discharge instructions given. Patient alert, oriented and ambulatory at discharge.

## 2019-11-29 NOTE — Discharge Instructions (Signed)
Infliximab injection What is this medicine? INFLIXIMAB (in [place] i mab) is used to treat Crohn's disease and ulcerative colitis. It is also used to treat ankylosing spondylitis, plaque psoriasis, and some forms of arthritis. This medicine may be used for other purposes; ask your health care provider or pharmacist if you have questions. COMMON BRAND NAME(S): AVSOLA, INFLECTRA, Remicade, RENFLEXIS What should I tell my health care provider before I take this medicine? They need to know if you have any of these conditions:  cancer  current or past resident of [place] or [place]  diabetes  exposure to tuberculosis  Guillain-Barre syndrome  heart failure  hepatitis or liver disease  immune system problems  infection  lung or breathing disease, like COPD  multiple sclerosis  receiving phototherapy for the skin  seizure disorder  an unusual or allergic reaction to infliximab, mouse proteins, other medicines, foods, dyes, or preservatives  pregnant or trying to get pregnant  breast-feeding How should I use this medicine? This medicine is for injection into a vein. It is usually given by a health care professional in a hospital or clinic setting. A special MedGuide will be given to you by the pharmacist with each prescription and refill. Be sure to read this information carefully each time. Talk to your pediatrician regarding the use of this medicine in children. While this drug may be prescribed for children as young as 58 years of age for selected conditions, precautions do apply. Overdosage: If you think you have taken too much of this medicine contact a poison control center or emergency room at once. NOTE: This medicine is only for you. Do not share this medicine with others. What if I miss a dose? It is important not to miss your dose. Call your doctor or health care professional if you are unable to keep an appointment. What may interact with this  medicine? Do not take this medicine with any of the following medications:  biologic medicines such as abatacept, adalimumab, anakinra, certolizumab, etanercept, golimumab, rituximab, secukinumab, tocilizumab, tofactinib, ustekinumab  live vaccines This list may not describe all possible interactions. Give your health care provider a list of all the medicines, herbs, non-prescription drugs, or dietary supplements you use. Also tell them if you smoke, drink alcohol, or use illegal drugs. Some items may interact with your medicine. What should I watch for while using this medicine? Your condition will be monitored carefully while you are receiving this medicine. Visit your doctor or health care professional for regular checks on your progress. You may need blood work done while you are taking this medicine. Before beginning therapy, your doctor may do a test to see if you have been exposed to tuberculosis. Call your doctor or health care professional for advice if you get a fever, chills or sore throat, or other symptoms of a cold or flu. Do not treat yourself. This drug decreases your body's ability to fight infections. Try to avoid being around people who are sick. This medicine may make the symptoms of heart failure worse in some patients. If you notice symptoms such as increased shortness of breath or swelling of the ankles or legs, contact your health care provider right away. If you are going to have surgery or dental work, tell your health care professional or dentist that you have received this medicine. If you take this medicine for plaque psoriasis, stay out of the sun. If you cannot avoid being in the sun, wear protective clothing and use sunscreen. Do  not use sun lamps or tanning beds/booths. Talk to your doctor about your risk of cancer. You may be more at risk for certain types of cancers if you take this medicine. What side effects may I notice from receiving this medicine? Side effects  that you should report to your doctor or health care professional as soon as possible:  allergic reactions like skin rash, itching or hives, swelling of the face, lips, or tongue  breathing problems  changes in vision  chest pain  fever or chills, usually related to the infusion  joint pain  pain, tingling, numbness in the hands or feet  redness, blistering, peeling or loosening of the skin, including inside the mouth  seizures  signs of infection - fever or chills, cough, sore throat, flu-like symptoms, pain or difficulty passing urine  signs and symptoms of liver injury like dark yellow or brown urine; general ill feeling; light-colored stools; loss of appetite; nausea; right upper belly pain; unusually weak or tired; yellowing of the eyes or skin  signs and symptoms of a stroke like changes in vision; confusion; trouble speaking or understanding; severe headaches; sudden numbness or weakness of the face, arm or leg; trouble walking; dizziness; loss of balance or coordination  swelling of the ankles, feet, or hands  swollen lymph nodes in the neck, underarm, or groin areas  unusual bleeding or bruising  unusually weak or tired Side effects that usually do not require medical attention (report to your doctor or health care professional if they continue or are bothersome):  headache  nausea  stomach pain  upset stomach This list may not describe all possible side effects. Call your doctor for medical advice about side effects. You may report side effects to FDA at 1-800-FDA-1088. Where should I keep my medicine? This drug is given in a hospital or clinic and will not be stored at home. NOTE: This sheet is a summary. It may not cover all possible information. If you have questions about this medicine, talk to your doctor, pharmacist, or health care provider.  2020 Elsevier/Gold Standard (2016-09-10 13:45:32)

## 2019-12-07 ENCOUNTER — Other Ambulatory Visit: Payer: Self-pay

## 2019-12-07 DIAGNOSIS — K5000 Crohn's disease of small intestine without complications: Secondary | ICD-10-CM

## 2019-12-29 ENCOUNTER — Encounter: Payer: Self-pay | Admitting: Internal Medicine

## 2019-12-29 ENCOUNTER — Other Ambulatory Visit (INDEPENDENT_AMBULATORY_CARE_PROVIDER_SITE_OTHER): Payer: Medicaid Other

## 2019-12-29 ENCOUNTER — Ambulatory Visit (INDEPENDENT_AMBULATORY_CARE_PROVIDER_SITE_OTHER): Payer: Medicaid Other | Admitting: Internal Medicine

## 2019-12-29 ENCOUNTER — Telehealth: Payer: Self-pay

## 2019-12-29 VITALS — BP 96/68 | HR 72 | Temp 97.4°F | Ht 68.0 in | Wt 130.0 lb

## 2019-12-29 DIAGNOSIS — K5000 Crohn's disease of small intestine without complications: Secondary | ICD-10-CM | POA: Diagnosis not present

## 2019-12-29 DIAGNOSIS — E559 Vitamin D deficiency, unspecified: Secondary | ICD-10-CM

## 2019-12-29 DIAGNOSIS — K50012 Crohn's disease of small intestine with intestinal obstruction: Secondary | ICD-10-CM

## 2019-12-29 DIAGNOSIS — Z7960 Long term (current) use of unspecified immunomodulators and immunosuppressants: Secondary | ICD-10-CM

## 2019-12-29 DIAGNOSIS — Z79899 Other long term (current) drug therapy: Secondary | ICD-10-CM

## 2019-12-29 DIAGNOSIS — K56699 Other intestinal obstruction unspecified as to partial versus complete obstruction: Secondary | ICD-10-CM

## 2019-12-29 DIAGNOSIS — F17200 Nicotine dependence, unspecified, uncomplicated: Secondary | ICD-10-CM

## 2019-12-29 LAB — COMPREHENSIVE METABOLIC PANEL
ALT: 7 U/L (ref 0–53)
AST: 14 U/L (ref 0–37)
Albumin: 5 g/dL (ref 3.5–5.2)
Alkaline Phosphatase: 74 U/L (ref 39–117)
BUN: 8 mg/dL (ref 6–23)
CO2: 30 mEq/L (ref 19–32)
Calcium: 9.8 mg/dL (ref 8.4–10.5)
Chloride: 105 mEq/L (ref 96–112)
Creatinine, Ser: 0.98 mg/dL (ref 0.40–1.50)
GFR: 110.34 mL/min (ref >60.00)
Glucose, Bld: 78 mg/dL (ref 70–99)
Potassium: 3.9 mEq/L (ref 3.5–5.1)
Sodium: 140 mEq/L (ref 135–145)
Total Bilirubin: 0.6 mg/dL (ref 0.2–1.2)
Total Protein: 7.9 g/dL (ref 6.0–8.3)

## 2019-12-29 LAB — VITAMIN D 25 HYDROXY (VIT D DEFICIENCY, FRACTURES): VITD: 26.14 ng/mL — ABNORMAL LOW (ref 30.00–100.00)

## 2019-12-29 LAB — C-REACTIVE PROTEIN: CRP: <1 mg/dL (ref 0.5–20.0)

## 2019-12-29 NOTE — Patient Instructions (Signed)
Your provider has requested that you go to the basement level for lab work before leaving today. Press "B" on the elevator. The lab is located at the first door on the left as you exit the elevator.   Continue to cut back on your smoking and try to stop.   Follow up with Dr Carlean Purl in 6-12 months.    I appreciate the opportunity to care for you. Silvano Rusk, MD, [facility]

## 2019-12-29 NOTE — Assessment & Plan Note (Signed)
Reassess vitamin D level

## 2019-12-29 NOTE — Assessment & Plan Note (Signed)
Continues to do well.  Last imaging was in 2017.  Since he is well I do not think we need to image his disease has been limited to the small bowel.  He will continue his Remicade.  QuantiFERON will be done today.  Other labs as well to include follow-up hepatitis B testing, he may need revaccination or booster.  CRP CBC CMET

## 2019-12-29 NOTE — Progress Notes (Signed)
Philip Joseph 29 y.o. Mar 24, 1991 [id number]  Assessment & Plan:   Crohn's ileitis Continues to do well.  Last imaging was in 2017.  Since he is well I do not think we need to image his disease has been limited to the small bowel.  He will continue his Remicade.  QuantiFERON will be done today.  Other labs as well to include follow-up hepatitis B testing, he may need revaccination or booster.  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  Consider Pneumovax booster.  Consider Prevnar 13 if that is still appropriate.   CC: Harvie Junior, MD   Subjective:   Chief Complaint:Follow-up of Crohn's ileitis  HPI The patient is here for annual follow-up, he has Crohn's ileitis and has been maintained on Remicade for a long time doing well.  Other problems include attention deficit disorder and moderate intellectual disability.  He continues to report that he is doing well without problems.  He says he has been vaccinated for Covid but does not have a card, it was a single shot at the [facility] and when prompted he thinks it might have been the J&J vaccine.  He does not remember exactly when. Wt Readings from Last 3 Encounters:  12/29/19 130 lb (59 kg)  11/29/19 128 lb (58.1 kg)  06/15/19 130 lb (59 kg)   He is no longer living with his girlfriend and their son but is living with his mother now.  His son is 3.  Allergies  Allergen Reactions  . Penicillins Rash    Has patient had a PCN reaction causing immediate rash, facial/tongue/throat swelling, SOB or lightheadedness with hypotension: No Has patient had a PCN reaction causing severe rash involving mucus membranes or skin  necrosis: No Has patient had a PCN reaction that required hospitalization No Has patient had a PCN reaction occurring within the last 10 years: No If all of the above answers are "NO", then may proceed with Cephalosporin use.    Current Meds  Medication Sig  . dicyclomine (BENTYL) 20 MG tablet Take 1 tablet (20 mg total) by mouth 2 (two) times daily as needed for spasms.  . InFLIXimab (REMICADE IV) Inject 300 mg into the vein every 8 (eight) weeks.   . ondansetron (ZOFRAN ODT) 4 MG disintegrating tablet Take 1 tablet (4 mg total) by mouth every 8 (eight) hours as needed for nausea or vomiting.   Past Medical History:  Diagnosis Date  . ADHD (attention deficit hyperactivity disorder)   . Crohn's ileitis ([place]) 12/19/2013  . IBS (irritable bowel syndrome)   . Long term current use of systemic steroids 11/27/2015  . 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 Unspecified vitamin D deficiency 12/21/2013   Past Surgical History:  Procedure Laterality Date  . COLONOSCOPY N/A 12/20/2013   Procedure: COLONOSCOPY;  Surgeon: [name], MD;  Location: [facility];  Service: Endoscopy;  Laterality: N/A;  . COLONOSCOPY    . WRIST FRACTURE SURGERY     Social History   Social History Narrative   Single, 1 son born 2018.   Lives w/ mom   + Smoker of tobacco and marijuana   Disability ?   family  history includes Prostate cancer in his father.   Review of Systems As above  Objective:   Physical Exam BP 96/68   Pulse 72   Temp (!) 97.4 F (36.3 C)   Ht 5' 8"  (1.727 m)   Wt 130 lb (59 kg)   BMI 19.77 kg/m  Well-developed thin but well-nourished black man Eyes anicteric Lungs are clear Normal heart sounds Abdomen is scaphoid soft nontender without organomegaly or mass and bowel sounds are present

## 2019-12-29 NOTE — Telephone Encounter (Signed)
-----   Message from [name], MD sent at 12/29/2019 12:46 PM EDT ----- Regarding: missed labs Lab did not get the previously ordered CBC and CMET - still need those  Thanks

## 2019-12-29 NOTE — Assessment & Plan Note (Addendum)
QuantiFERON and hepatitis B studies today.  Consider Pneumovax booster.  Consider Prevnar 13 if that is still appropriate.

## 2019-12-29 NOTE — Assessment & Plan Note (Signed)
He has had this in the past it does not seem to be symptomatic

## 2019-12-29 NOTE — Assessment & Plan Note (Signed)
He says he is reducing I urged him to continue to reduce and quit

## 2019-12-29 NOTE — Telephone Encounter (Signed)
I spoke with Philip Joseph in the lab and they didn't get the CBC. They can do the CMET.   I have called and left Philip Joseph a detailed message to come back by and have the CBC drawn.

## 2019-12-31 LAB — QUANTIFERON-TB GOLD PLUS
Mitogen-NIL: 8.47 IU/mL
NIL: 0.06 IU/mL
QuantiFERON-TB Gold Plus: NEGATIVE
TB1-NIL: 0 IU/mL
TB2-NIL: 0.02 IU/mL

## 2019-12-31 LAB — HEPATITIS B SURFACE ANTIGEN: Hepatitis B Surface Ag: NONREACTIVE

## 2019-12-31 LAB — HEPATITIS B SURFACE ANTIBODY, QUANTITATIVE: Hep B S AB Quant (Post): <5 m[IU]/mL — ABNORMAL LOW (ref >=10)

## 2020-01-02 NOTE — Telephone Encounter (Signed)
Spoke with Philip Joseph and as soon as he can get a ride he will come back to have the CBC drawn.

## 2020-01-04 ENCOUNTER — Other Ambulatory Visit: Payer: Self-pay | Admitting: Internal Medicine

## 2020-01-11 NOTE — Telephone Encounter (Signed)
I spoke with Philip Joseph and he is still trying to find a ride here.

## 2020-01-20 NOTE — Telephone Encounter (Signed)
Philip Joseph is still working on getting a ride. He has his infusion next Tuesday but he doesn't want to come after. [name] he is concerned about taking meds with his infusion and then coming to get a shot.

## 2020-01-24 ENCOUNTER — Ambulatory Visit (HOSPITAL_COMMUNITY)
Admission: RE | Admit: 2020-01-24 | Discharge: 2020-01-24 | Disposition: A | Discharge status: Home / Self Care | Payer: Medicaid Other | Source: Ambulatory Visit | Attending: Internal Medicine | Admitting: Internal Medicine

## 2020-01-24 ENCOUNTER — Other Ambulatory Visit: Payer: Self-pay

## 2020-01-24 DIAGNOSIS — K5000 Crohn's disease of small intestine without complications: Secondary | ICD-10-CM | POA: Insufficient documentation

## 2020-01-24 MED ORDER — SODIUM CHLORIDE 0.9 % IV SOLN
INTRAVENOUS | Status: DC | PRN
  Administered 2020-01-24: 250 mL via INTRAVENOUS

## 2020-01-24 MED ORDER — DIPHENHYDRAMINE HCL 25 MG PO CAPS
50.0000 mg | ORAL_CAPSULE | Freq: Once | ORAL | Status: AC
  Administered 2020-01-24: 50 mg via ORAL
  Filled 2020-01-24: qty 2

## 2020-01-24 MED ORDER — ACETAMINOPHEN 325 MG PO TABS
650.0000 mg | ORAL_TABLET | Freq: Once | ORAL | Status: AC
  Administered 2020-01-24: 650 mg via ORAL
  Filled 2020-01-24: qty 2

## 2020-01-24 MED ORDER — SODIUM CHLORIDE 0.9 % IV SOLN
300.0000 mg | INTRAVENOUS | Status: DC
  Administered 2020-01-24: 300 mg via INTRAVENOUS
  Filled 2020-01-24: qty 30

## 2020-01-24 NOTE — Progress Notes (Signed)
PATIENT CARE CENTER NOTE  Diagnosis:Crohn's disease of ileum without complication ([place]) (F12.19)   Provider:Gessner, Glendell Docker, MD    Procedure:Remicade IV   Note:Patient received Remicade infusion via PIV.Pre-medications (Tylenol and Benadryl) given per order. Infusion titrated per protocol. Patient tolerated well with no adverse reaction. Vital signs stable. Discharge instructions given. Patient alert, oriented and ambulatory at discharge.

## 2020-01-24 NOTE — Discharge Instructions (Signed)
Infliximab injection What is this medicine? INFLIXIMAB (in [place] i mab) is used to treat Crohn's disease and ulcerative colitis. It is also used to treat ankylosing spondylitis, plaque psoriasis, and some forms of arthritis. This medicine may be used for other purposes; ask your health care provider or pharmacist if you have questions. COMMON BRAND NAME(S): AVSOLA, INFLECTRA, Remicade, RENFLEXIS What should I tell my health care provider before I take this medicine? They need to know if you have any of these conditions:  cancer  current or past resident of [place] or [place]  diabetes  exposure to tuberculosis  Guillain-Barre syndrome  heart failure  hepatitis or liver disease  immune system problems  infection  lung or breathing disease, like COPD  multiple sclerosis  receiving phototherapy for the skin  seizure disorder  an unusual or allergic reaction to infliximab, mouse proteins, other medicines, foods, dyes, or preservatives  pregnant or trying to get pregnant  breast-feeding How should I use this medicine? This medicine is for injection into a vein. It is usually given by a health care professional in a hospital or clinic setting. A special MedGuide will be given to you by the pharmacist with each prescription and refill. Be sure to read this information carefully each time. Talk to your pediatrician regarding the use of this medicine in children. While this drug may be prescribed for children as young as 31 years of age for selected conditions, precautions do apply. Overdosage: If you think you have taken too much of this medicine contact a poison control center or emergency room at once. NOTE: This medicine is only for you. Do not share this medicine with others. What if I miss a dose? It is important not to miss your dose. Call your doctor or health care professional if you are unable to keep an appointment. What may interact with this  medicine? Do not take this medicine with any of the following medications:  biologic medicines such as abatacept, adalimumab, anakinra, certolizumab, etanercept, golimumab, rituximab, secukinumab, tocilizumab, tofactinib, ustekinumab  live vaccines This list may not describe all possible interactions. Give your health care provider a list of all the medicines, herbs, non-prescription drugs, or dietary supplements you use. Also tell them if you smoke, drink alcohol, or use illegal drugs. Some items may interact with your medicine. What should I watch for while using this medicine? Your condition will be monitored carefully while you are receiving this medicine. Visit your doctor or health care professional for regular checks on your progress. You may need blood work done while you are taking this medicine. Before beginning therapy, your doctor may do a test to see if you have been exposed to tuberculosis. Call your doctor or health care professional for advice if you get a fever, chills or sore throat, or other symptoms of a cold or flu. Do not treat yourself. This drug decreases your body's ability to fight infections. Try to avoid being around people who are sick. This medicine may make the symptoms of heart failure worse in some patients. If you notice symptoms such as increased shortness of breath or swelling of the ankles or legs, contact your health care provider right away. If you are going to have surgery or dental work, tell your health care professional or dentist that you have received this medicine. If you take this medicine for plaque psoriasis, stay out of the sun. If you cannot avoid being in the sun, wear protective clothing and use sunscreen. Do  not use sun lamps or tanning beds/booths. Talk to your doctor about your risk of cancer. You may be more at risk for certain types of cancers if you take this medicine. What side effects may I notice from receiving this medicine? Side effects  that you should report to your doctor or health care professional as soon as possible:  allergic reactions like skin rash, itching or hives, swelling of the face, lips, or tongue  breathing problems  changes in vision  chest pain  fever or chills, usually related to the infusion  joint pain  pain, tingling, numbness in the hands or feet  redness, blistering, peeling or loosening of the skin, including inside the mouth  seizures  signs of infection - fever or chills, cough, sore throat, flu-like symptoms, pain or difficulty passing urine  signs and symptoms of liver injury like dark yellow or brown urine; general ill feeling; light-colored stools; loss of appetite; nausea; right upper belly pain; unusually weak or tired; yellowing of the eyes or skin  signs and symptoms of a stroke like changes in vision; confusion; trouble speaking or understanding; severe headaches; sudden numbness or weakness of the face, arm or leg; trouble walking; dizziness; loss of balance or coordination  swelling of the ankles, feet, or hands  swollen lymph nodes in the neck, underarm, or groin areas  unusual bleeding or bruising  unusually weak or tired Side effects that usually do not require medical attention (report to your doctor or health care professional if they continue or are bothersome):  headache  nausea  stomach pain  upset stomach This list may not describe all possible side effects. Call your doctor for medical advice about side effects. You may report side effects to FDA at 1-800-FDA-1088. Where should I keep my medicine? This drug is given in a hospital or clinic and will not be stored at home. NOTE: This sheet is a summary. It may not cover all possible information. If you have questions about this medicine, talk to your doctor, pharmacist, or health care provider.  2020 Elsevier/Gold Standard (2016-09-10 13:45:32)

## 2020-01-24 NOTE — Progress Notes (Signed)
Called [facility] and spoke with Dr. Wilkie Aye RN concerning patient's orders. Patient recieves Remicaide infusion every 8 weeks with [place] at [facility] [place].  Original sign and held order was placed on 12/27/2018 therefore order is over 28 year old. Called to verify if order was still active and correct. Dr. Celesta Aver RN verified that order was still active and accurate. Will continue to follow original order.

## 2020-01-27 NOTE — Progress Notes (Signed)
I left him another detailed message to call me and that he needs to come and get lab work (CBC) and the Heplisav vaccine.

## 2020-02-06 ENCOUNTER — Other Ambulatory Visit (INDEPENDENT_AMBULATORY_CARE_PROVIDER_SITE_OTHER): Payer: Medicaid Other

## 2020-02-06 DIAGNOSIS — K5000 Crohn's disease of small intestine without complications: Secondary | ICD-10-CM

## 2020-02-06 LAB — CBC
HCT: 43 % (ref 39.0–52.0)
Hemoglobin: 15 g/dL (ref 13.0–17.0)
MCHC: 34.9 g/dL (ref 30.0–36.0)
MCV: 92.3 fl (ref 78.0–100.0)
Platelets: 160 10*3/uL (ref 150.0–400.0)
RBC: 4.66 Mil/uL (ref 4.22–5.81)
RDW: 12.7 % (ref 11.5–15.5)
WBC: 6.7 10*3/uL (ref 4.0–10.5)

## 2020-02-23 ENCOUNTER — Telehealth: Payer: Self-pay | Admitting: Internal Medicine

## 2020-02-23 MED ORDER — DICYCLOMINE HCL 20 MG PO TABS: 20.0000 mg | ORAL_TABLET | Freq: Two times a day (BID) | ORAL | 2 refills | Status: DC | PRN

## 2020-02-23 MED ORDER — HEPATITIS B VAC RECOMB ADJ 20 MCG/0.5ML IM SOSY: 0.5000 mL | PREFILLED_SYRINGE | Freq: Once | INTRAMUSCULAR | 1 refills | Status: AC

## 2020-02-23 NOTE — Telephone Encounter (Signed)
I spoke with Philip Joseph and he is requesting a refill because he is getting ready to go on a trip for his birthday. We talked and since he hasn't come in for his Heplisav - B injection we sent it to the pharmacy so he can get it there when he goes to get his medicine.

## 2020-02-23 NOTE — Progress Notes (Signed)
I spoke with Philip Joseph and he has already come back for his CBC to be drawn and he request I send the rx for his Hep b vaccine to the pharmacy so he can get it there when he goes to pick up his rx today.

## 2020-02-23 NOTE — Telephone Encounter (Signed)
Philip Joseph had the CBC drawn and will go to the pharmacy for his Heplisav-B vaccine.

## 2020-02-23 NOTE — Telephone Encounter (Signed)
Patient requesting refill on [place]

## 2020-03-12 ENCOUNTER — Other Ambulatory Visit: Payer: Self-pay

## 2020-03-12 ENCOUNTER — Ambulatory Visit (HOSPITAL_COMMUNITY)
Admission: RE | Admit: 2020-03-12 | Discharge: 2020-03-12 | Disposition: A | Discharge status: Home / Self Care | Payer: Medicaid Other | Source: Ambulatory Visit | Attending: Internal Medicine | Admitting: Internal Medicine

## 2020-03-12 DIAGNOSIS — K5000 Crohn's disease of small intestine without complications: Secondary | ICD-10-CM | POA: Diagnosis present

## 2020-03-12 MED ORDER — SODIUM CHLORIDE 0.9 % IV SOLN
300.0000 mg | INTRAVENOUS | Status: DC
  Administered 2020-03-12: 300 mg via INTRAVENOUS
  Filled 2020-03-12: qty 30

## 2020-03-12 MED ORDER — ACETAMINOPHEN 325 MG PO TABS
650.0000 mg | ORAL_TABLET | Freq: Once | ORAL | Status: AC
  Administered 2020-03-12: 650 mg via ORAL
  Filled 2020-03-12: qty 2

## 2020-03-12 MED ORDER — SODIUM CHLORIDE 0.9 % IV SOLN
INTRAVENOUS | Status: DC | PRN
  Administered 2020-03-12: 250 mL via INTRAVENOUS

## 2020-03-12 MED ORDER — DIPHENHYDRAMINE HCL 25 MG PO CAPS
50.0000 mg | ORAL_CAPSULE | Freq: Once | ORAL | Status: AC
  Administered 2020-03-12: 50 mg via ORAL
  Filled 2020-03-12: qty 2

## 2020-03-12 NOTE — Discharge Instructions (Signed)
Infliximab injection What is this medicine? INFLIXIMAB (in [place] i mab) is used to treat Crohn's disease and ulcerative colitis. It is also used to treat ankylosing spondylitis, plaque psoriasis, and some forms of arthritis. This medicine may be used for other purposes; ask your health care provider or pharmacist if you have questions. COMMON BRAND NAME(S): AVSOLA, INFLECTRA, Remicade, RENFLEXIS What should I tell my health care provider before I take this medicine? They need to know if you have any of these conditions:  cancer  current or past resident of [place] or [place]  diabetes  exposure to tuberculosis  Guillain-Barre syndrome  heart failure  hepatitis or liver disease  immune system problems  infection  lung or breathing disease, like COPD  multiple sclerosis  receiving phototherapy for the skin  seizure disorder  an unusual or allergic reaction to infliximab, mouse proteins, other medicines, foods, dyes, or preservatives  pregnant or trying to get pregnant  breast-feeding How should I use this medicine? This medicine is for injection into a vein. It is usually given by a health care professional in a hospital or clinic setting. A special MedGuide will be given to you by the pharmacist with each prescription and refill. Be sure to read this information carefully each time. Talk to your pediatrician regarding the use of this medicine in children. While this drug may be prescribed for children as young as 17 years of age for selected conditions, precautions do apply. Overdosage: If you think you have taken too much of this medicine contact a poison control center or emergency room at once. NOTE: This medicine is only for you. Do not share this medicine with others. What if I miss a dose? It is important not to miss your dose. Call your doctor or health care professional if you are unable to keep an appointment. What may interact with this  medicine? Do not take this medicine with any of the following medications:  biologic medicines such as abatacept, adalimumab, anakinra, certolizumab, etanercept, golimumab, rituximab, secukinumab, tocilizumab, tofactinib, ustekinumab  live vaccines This list may not describe all possible interactions. Give your health care provider a list of all the medicines, herbs, non-prescription drugs, or dietary supplements you use. Also tell them if you smoke, drink alcohol, or use illegal drugs. Some items may interact with your medicine. What should I watch for while using this medicine? Your condition will be monitored carefully while you are receiving this medicine. Visit your doctor or health care professional for regular checks on your progress. You may need blood work done while you are taking this medicine. Before beginning therapy, your doctor may do a test to see if you have been exposed to tuberculosis. Call your doctor or health care professional for advice if you get a fever, chills or sore throat, or other symptoms of a cold or flu. Do not treat yourself. This drug decreases your body's ability to fight infections. Try to avoid being around people who are sick. This medicine may make the symptoms of heart failure worse in some patients. If you notice symptoms such as increased shortness of breath or swelling of the ankles or legs, contact your health care provider right away. If you are going to have surgery or dental work, tell your health care professional or dentist that you have received this medicine. If you take this medicine for plaque psoriasis, stay out of the sun. If you cannot avoid being in the sun, wear protective clothing and use sunscreen. Do  not use sun lamps or tanning beds/booths. Talk to your doctor about your risk of cancer. You may be more at risk for certain types of cancers if you take this medicine. What side effects may I notice from receiving this medicine? Side effects  that you should report to your doctor or health care professional as soon as possible:  allergic reactions like skin rash, itching or hives, swelling of the face, lips, or tongue  breathing problems  changes in vision  chest pain  fever or chills, usually related to the infusion  joint pain  pain, tingling, numbness in the hands or feet  redness, blistering, peeling or loosening of the skin, including inside the mouth  seizures  signs of infection - fever or chills, cough, sore throat, flu-like symptoms, pain or difficulty passing urine  signs and symptoms of liver injury like dark yellow or brown urine; general ill feeling; light-colored stools; loss of appetite; nausea; right upper belly pain; unusually weak or tired; yellowing of the eyes or skin  signs and symptoms of a stroke like changes in vision; confusion; trouble speaking or understanding; severe headaches; sudden numbness or weakness of the face, arm or leg; trouble walking; dizziness; loss of balance or coordination  swelling of the ankles, feet, or hands  swollen lymph nodes in the neck, underarm, or groin areas  unusual bleeding or bruising  unusually weak or tired Side effects that usually do not require medical attention (report to your doctor or health care professional if they continue or are bothersome):  headache  nausea  stomach pain  upset stomach This list may not describe all possible side effects. Call your doctor for medical advice about side effects. You may report side effects to FDA at 1-800-FDA-1088. Where should I keep my medicine? This drug is given in a hospital or clinic and will not be stored at home. NOTE: This sheet is a summary. It may not cover all possible information. If you have questions about this medicine, talk to your doctor, pharmacist, or health care provider.  2020 Elsevier/Gold Standard (2016-09-10 13:45:32)

## 2020-03-12 NOTE — Progress Notes (Signed)
PATIENT CARE CENTER NOTE  Diagnosis:Crohn's disease of ileum without complication ([place]) (X11.55)   Provider:Gessner, Glendell Docker, MD    Procedure:Remicade IV   Note:Patient received Remicade infusionvia PIV.Pre-medications(Tylenol and Benadryl)given per order. Infusion titrated per protocol.Patient tolerated well with no adverse reaction. Vital signs stable. Discharge instructions given. Patient alert, oriented and ambulatory at discharge.

## 2020-03-13 ENCOUNTER — Ambulatory Visit (HOSPITAL_COMMUNITY): Payer: Medicaid Other

## 2020-05-07 ENCOUNTER — Ambulatory Visit (HOSPITAL_COMMUNITY)
Admission: RE | Admit: 2020-05-07 | Discharge: 2020-05-07 | Disposition: A | Discharge status: Home / Self Care | Payer: Medicaid Other | Source: Ambulatory Visit | Attending: Internal Medicine | Admitting: Internal Medicine

## 2020-05-07 ENCOUNTER — Other Ambulatory Visit: Payer: Self-pay

## 2020-05-07 DIAGNOSIS — K5000 Crohn's disease of small intestine without complications: Secondary | ICD-10-CM | POA: Insufficient documentation

## 2020-05-07 MED ORDER — DIPHENHYDRAMINE HCL 25 MG PO CAPS
50.0000 mg | ORAL_CAPSULE | Freq: Once | ORAL | Status: AC
  Administered 2020-05-07: 50 mg via ORAL
  Filled 2020-05-07: qty 2

## 2020-05-07 MED ORDER — ACETAMINOPHEN 325 MG PO TABS
650.0000 mg | ORAL_TABLET | Freq: Once | ORAL | Status: AC
  Administered 2020-05-07: 650 mg via ORAL
  Filled 2020-05-07: qty 2

## 2020-05-07 MED ORDER — SODIUM CHLORIDE 0.9 % IV SOLN
300.0000 mg | INTRAVENOUS | Status: DC
  Administered 2020-05-07: 300 mg via INTRAVENOUS
  Filled 2020-05-07: qty 30

## 2020-05-07 MED ORDER — SODIUM CHLORIDE 0.9 % IV SOLN
INTRAVENOUS | Status: DC | PRN
  Administered 2020-05-07: 250 mL via INTRAVENOUS

## 2020-05-07 NOTE — Discharge Instructions (Signed)
Infliximab injection What is this medicine? INFLIXIMAB (in [place] i mab) is used to treat Crohn's disease and ulcerative colitis. It is also used to treat ankylosing spondylitis, plaque psoriasis, and some forms of arthritis. This medicine may be used for other purposes; ask your health care provider or pharmacist if you have questions. COMMON BRAND NAME(S): AVSOLA, INFLECTRA, Remicade, RENFLEXIS What should I tell my health care provider before I take this medicine? They need to know if you have any of these conditions:  cancer  current or past resident of [place] or [place]  diabetes  exposure to tuberculosis  Guillain-Barre syndrome  heart failure  hepatitis or liver disease  immune system problems  infection  lung or breathing disease, like COPD  multiple sclerosis  receiving phototherapy for the skin  seizure disorder  an unusual or allergic reaction to infliximab, mouse proteins, other medicines, foods, dyes, or preservatives  pregnant or trying to get pregnant  breast-feeding How should I use this medicine? This medicine is for injection into a vein. It is usually given by a health care professional in a hospital or clinic setting. A special MedGuide will be given to you by the pharmacist with each prescription and refill. Be sure to read this information carefully each time. Talk to your pediatrician regarding the use of this medicine in children. While this drug may be prescribed for children as young as 28 years of age for selected conditions, precautions do apply. Overdosage: If you think you have taken too much of this medicine contact a poison control center or emergency room at once. NOTE: This medicine is only for you. Do not share this medicine with others. What if I miss a dose? It is important not to miss your dose. Call your doctor or health care professional if you are unable to keep an appointment. What may interact with this  medicine? Do not take this medicine with any of the following medications:  biologic medicines such as abatacept, adalimumab, anakinra, certolizumab, etanercept, golimumab, rituximab, secukinumab, tocilizumab, tofactinib, ustekinumab  live vaccines This list may not describe all possible interactions. Give your health care provider a list of all the medicines, herbs, non-prescription drugs, or dietary supplements you use. Also tell them if you smoke, drink alcohol, or use illegal drugs. Some items may interact with your medicine. What should I watch for while using this medicine? Your condition will be monitored carefully while you are receiving this medicine. Visit your doctor or health care professional for regular checks on your progress. You may need blood work done while you are taking this medicine. Before beginning therapy, your doctor may do a test to see if you have been exposed to tuberculosis. Call your doctor or health care professional for advice if you get a fever, chills or sore throat, or other symptoms of a cold or flu. Do not treat yourself. This drug decreases your body's ability to fight infections. Try to avoid being around people who are sick. This medicine may make the symptoms of heart failure worse in some patients. If you notice symptoms such as increased shortness of breath or swelling of the ankles or legs, contact your health care provider right away. If you are going to have surgery or dental work, tell your health care professional or dentist that you have received this medicine. If you take this medicine for plaque psoriasis, stay out of the sun. If you cannot avoid being in the sun, wear protective clothing and use sunscreen. Do  not use sun lamps or tanning beds/booths. Talk to your doctor about your risk of cancer. You may be more at risk for certain types of cancers if you take this medicine. What side effects may I notice from receiving this medicine? Side effects  that you should report to your doctor or health care professional as soon as possible:  allergic reactions like skin rash, itching or hives, swelling of the face, lips, or tongue  breathing problems  changes in vision  chest pain  fever or chills, usually related to the infusion  joint pain  pain, tingling, numbness in the hands or feet  redness, blistering, peeling or loosening of the skin, including inside the mouth  seizures  signs of infection - fever or chills, cough, sore throat, flu-like symptoms, pain or difficulty passing urine  signs and symptoms of liver injury like dark yellow or brown urine; general ill feeling; light-colored stools; loss of appetite; nausea; right upper belly pain; unusually weak or tired; yellowing of the eyes or skin  signs and symptoms of a stroke like changes in vision; confusion; trouble speaking or understanding; severe headaches; sudden numbness or weakness of the face, arm or leg; trouble walking; dizziness; loss of balance or coordination  swelling of the ankles, feet, or hands  swollen lymph nodes in the neck, underarm, or groin areas  unusual bleeding or bruising  unusually weak or tired Side effects that usually do not require medical attention (report to your doctor or health care professional if they continue or are bothersome):  headache  nausea  stomach pain  upset stomach This list may not describe all possible side effects. Call your doctor for medical advice about side effects. You may report side effects to FDA at 1-800-FDA-1088. Where should I keep my medicine? This drug is given in a hospital or clinic and will not be stored at home. NOTE: This sheet is a summary. It may not cover all possible information. If you have questions about this medicine, talk to your doctor, pharmacist, or health care provider.  2020 Elsevier/Gold Standard (2016-09-10 13:45:32)

## 2020-05-07 NOTE — Progress Notes (Signed)
PATIENT CARE CENTER NOTE  Diagnosis:Crohn's disease of ileum without complication ([place]) (R67.89)   Provider:Gessner, Glendell Docker, MD    Procedure:Remicade IV   Note:Patient received Remicade infusionvia PIV.Pre-medications(Tylenol and Benadryl)given per order. Infusion titrated per protocol.Patient tolerated well with no adverse reaction. Vital signs stable. Discharge instructions given. Patient alert, oriented and ambulatory at discharge.

## 2020-05-27 ENCOUNTER — Emergency Department (HOSPITAL_COMMUNITY)
Admission: EM | Admit: 2020-05-27 | Discharge: 2020-05-27 | Disposition: A | Discharge status: Home / Self Care | Payer: Medicaid Other | Attending: Emergency Medicine | Admitting: Emergency Medicine

## 2020-05-27 ENCOUNTER — Encounter (HOSPITAL_COMMUNITY): Payer: Self-pay | Admitting: Obstetrics and Gynecology

## 2020-05-27 ENCOUNTER — Other Ambulatory Visit: Payer: Self-pay

## 2020-05-27 DIAGNOSIS — K2921 Alcoholic gastritis with bleeding: Secondary | ICD-10-CM | POA: Diagnosis not present

## 2020-05-27 DIAGNOSIS — F17210 Nicotine dependence, cigarettes, uncomplicated: Secondary | ICD-10-CM | POA: Diagnosis not present

## 2020-05-27 DIAGNOSIS — R112 Nausea with vomiting, unspecified: Secondary | ICD-10-CM

## 2020-05-27 DIAGNOSIS — K2920 Alcoholic gastritis without bleeding: Secondary | ICD-10-CM

## 2020-05-27 LAB — CBC
HCT: 46.9 % (ref 39.0–52.0)
Hemoglobin: 17.3 g/dL — ABNORMAL HIGH (ref 13.0–17.0)
MCH: 32.9 pg (ref 26.0–34.0)
MCHC: 36.9 g/dL — ABNORMAL HIGH (ref 30.0–36.0)
MCV: 89.2 fL (ref 80.0–100.0)
Platelets: 196 10*3/uL (ref 150–400)
RBC: 5.26 MIL/uL (ref 4.22–5.81)
RDW: 11.4 % — ABNORMAL LOW (ref 11.5–15.5)
WBC: 13.7 10*3/uL — ABNORMAL HIGH (ref 4.0–10.5)
nRBC: 0 % (ref 0.0–0.2)

## 2020-05-27 LAB — COMPREHENSIVE METABOLIC PANEL
ALT: 31 U/L (ref 0–44)
AST: 36 U/L (ref 15–41)
Albumin: 5.6 g/dL — ABNORMAL HIGH (ref 3.5–5.0)
Alkaline Phosphatase: 78 U/L (ref 38–126)
Anion gap: 14 (ref 5–15)
BUN: 26 mg/dL — ABNORMAL HIGH (ref 6–20)
CO2: 29 mmol/L (ref 22–32)
Calcium: 10.1 mg/dL (ref 8.9–10.3)
Chloride: 94 mmol/L — ABNORMAL LOW (ref 98–111)
Creatinine, Ser: 1.15 mg/dL (ref 0.61–1.24)
GFR calc Af Amer: >60 mL/min (ref >60)
GFR calc non Af Amer: >60 mL/min (ref >60)
Glucose, Bld: 117 mg/dL — ABNORMAL HIGH (ref 70–99)
Potassium: 3.7 mmol/L (ref 3.5–5.1)
Sodium: 137 mmol/L (ref 135–145)
Total Bilirubin: 2.1 mg/dL — ABNORMAL HIGH (ref 0.3–1.2)
Total Protein: 8.9 g/dL — ABNORMAL HIGH (ref 6.5–8.1)

## 2020-05-27 LAB — LIPASE, BLOOD: Lipase: 25 U/L (ref 11–51)

## 2020-05-27 MED ORDER — ONDANSETRON HCL 4 MG/2ML IJ SOLN
4.0000 mg | Freq: Once | INTRAMUSCULAR | Status: AC
  Administered 2020-05-27: 4 mg via INTRAVENOUS
  Filled 2020-05-27: qty 2

## 2020-05-27 MED ORDER — ONDANSETRON 4 MG PO TBDP: 4.0000 mg | ORAL_TABLET | Freq: Three times a day (TID) | ORAL | 0 refills | Status: AC | PRN

## 2020-05-27 MED ORDER — SUCRALFATE 1 G PO TABS: 1.0000 g | ORAL_TABLET | Freq: Three times a day (TID) | ORAL | 0 refills | Status: DC

## 2020-05-27 MED ORDER — LACTATED RINGERS IV BOLUS
1000.0000 mL | Freq: Once | INTRAVENOUS | Status: AC
  Administered 2020-05-27: 1000 mL via INTRAVENOUS

## 2020-05-27 MED ORDER — ALUM & MAG HYDROXIDE-SIMETH 200-200-20 MG/5ML PO SUSP
30.0000 mL | Freq: Once | ORAL | Status: AC
  Administered 2020-05-27: 30 mL via ORAL
  Filled 2020-05-27: qty 30

## 2020-05-27 MED ORDER — PANTOPRAZOLE SODIUM 40 MG IV SOLR
40.0000 mg | Freq: Once | INTRAVENOUS | Status: AC
  Administered 2020-05-27: 40 mg via INTRAVENOUS
  Filled 2020-05-27: qty 40

## 2020-05-27 NOTE — ED Notes (Signed)
PA aware that pt refused EKG and urinalysis.

## 2020-05-27 NOTE — ED Notes (Addendum)
Pt refused EKG.

## 2020-05-27 NOTE — ED Notes (Signed)
Pt tolerated maalox without emesis.

## 2020-05-27 NOTE — ED Provider Notes (Signed)
[facility] DEPT Provider Note   CSN: [id number] Arrival date & time: 05/27/20  1152     History Chief Complaint  Patient presents with  . Emesis    Philip Joseph is a 29 y.o. male with a past medical history of Crohn's, who presents today for evaluation of emesis.  He reports that 2 days ago he drank 2 double shots of liquor, 2 glasses of wine and 2 beers and started vomiting.  Since then he has continued to vomit and has been unable to tolerate p.o. intake.  He denies any fevers.  He states that this does not feel like a Crohn's flare.  He reports that anytime he tries to take any p.o. intake he vomits.  He had one very small bowel movement since it started.  He denies any known sick contacts.  He reports compliance with his medications.  He denies blood in his vomit.     HPI     Past Medical History:  Diagnosis Date  . ADHD (attention deficit hyperactivity disorder)   . Crohn's ileitis ([place]) 12/19/2013  . IBS (irritable bowel syndrome)   . Long term current use of systemic steroids 11/27/2015  . 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 Unspecified vitamin D deficiency 12/21/2013    Patient Active Problem List   Diagnosis Date Noted  . Long-term use of immunosuppressant medication- Remicade 12/09/2016  . 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 Smoker 11/29/2014  . Vitamin D deficiency 12/21/2013  . 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 Attention deficit hyperactivity disorder (ADHD) 04/22/2010    Past Surgical History:  Procedure Laterality Date  . COLONOSCOPY N/A 12/20/2013   Procedure: COLONOSCOPY;  Surgeon: [name], MD;  Location: [facility];  Service: Endoscopy;  Laterality: N/A;  . COLONOSCOPY    . WRIST FRACTURE SURGERY         Family History  Problem Relation Age of Onset  . Prostate cancer Father     Social History   Tobacco Use  . Smoking  status: Current Some Day Smoker    Types: Cigarettes  . Smokeless tobacco: Never Used  Vaping Use  . Vaping Use: Never used  Substance Use Topics  . Alcohol use: Yes    Alcohol/week: 0.0 standard drinks  . Drug use: Yes    Types: Marijuana    Comment: Denies     Home Medications Prior to Admission medications   Medication Sig Start Date End Date Taking? Authorizing Provider  dicyclomine (BENTYL) 20 MG tablet Take 1 tablet (20 mg total) by mouth 2 (two) times daily as needed for spasms. 02/23/20  Yes [name], MD  InFLIXimab (REMICADE IV) Inject 300 mg into the vein every 8 (eight) weeks.    Yes [provider]  ondansetron (ZOFRAN ODT) 4 MG disintegrating tablet Take 1 tablet (4 mg total) by mouth every 8 (eight) hours as needed for nausea or vomiting. 05/27/20   Lorin Glass, PA-C  sucralfate (CARAFATE) 1 g tablet Take 1 tablet (1 g total) by mouth 4 (four) times daily -  with meals and at bedtime for 7 days. 05/27/20 06/03/20  Lorin Glass, PA-C    Allergies    Penicillins  Review of Systems   Review of Systems  Constitutional: Negative for chills and fever.  HENT: Negative for congestion.   Respiratory: Negative for chest tightness and shortness of breath.   Cardiovascular: Negative for  chest pain.  Gastrointestinal: Positive for nausea and vomiting. Negative for abdominal pain, constipation and diarrhea.  Genitourinary: Negative for decreased urine volume, difficulty urinating and dysuria.  Musculoskeletal: Negative for back pain and neck pain.  Neurological: Negative for headaches.  Psychiatric/Behavioral: Negative for confusion.  All other systems reviewed and are negative.   Physical Exam Updated Vital Signs BP 118/66 (BP Location: Right Arm)   Pulse 64   Temp 98.7 F (37.1 C) (Oral)   Resp 16   Ht 5' 9"  (1.753 m)   Wt 68 kg   SpO2 100%   BMI 22.15 kg/m   Physical Exam Vitals and nursing note reviewed.  Constitutional:       General: He is not in acute distress.    Appearance: He is well-developed. He is not diaphoretic.  HENT:     Head: Normocephalic and atraumatic.  Eyes:     General: No scleral icterus.       Right eye: No discharge.        Left eye: No discharge.     Conjunctiva/sclera: Conjunctivae normal.  Cardiovascular:     Rate and Rhythm: Normal rate and regular rhythm.     Pulses: Normal pulses.     Heart sounds: Normal heart sounds.  Pulmonary:     Effort: Pulmonary effort is normal. No respiratory distress.     Breath sounds: No stridor.  Abdominal:     General: Abdomen is flat. There is no distension.     Tenderness: There is no abdominal tenderness. There is no guarding.  Musculoskeletal:        General: No deformity.     Cervical back: Normal range of motion.     Right lower leg: No edema.     Left lower leg: No edema.  Skin:    General: Skin is warm and dry.  Neurological:     General: No focal deficit present.     Mental Status: He is alert and oriented to person, place, and time.     Motor: No abnormal muscle tone.  Psychiatric:        Mood and Affect: Mood normal.        Behavior: Behavior normal.     ED Results / Procedures / Treatments   Labs (all labs ordered are listed, but only abnormal results are displayed) Labs Reviewed  COMPREHENSIVE METABOLIC PANEL - Abnormal; Notable for the following components:      Result Value   Chloride 94 (*)    Glucose, Bld 117 (*)    BUN 26 (*)    Total Protein 8.9 (*)    Albumin 5.6 (*)    Total Bilirubin 2.1 (*)    All other components within normal limits  CBC - Abnormal; Notable for the following components:   WBC 13.7 (*)    Hemoglobin 17.3 (*)    MCHC 36.9 (*)    RDW 11.4 (*)    All other components within normal limits  LIPASE, BLOOD    EKG None  Radiology No results found.  Procedures Procedures (including critical care time)  Medications Ordered in ED Medications  lactated ringers bolus 1,000 mL (0 mLs  Intravenous Stopped 05/27/20 1607)  ondansetron (ZOFRAN) injection 4 mg (4 mg Intravenous Given 05/27/20 1453)  pantoprazole (PROTONIX) injection 40 mg (40 mg Intravenous Given 05/27/20 1456)  alum & mag hydroxide-simeth (MAALOX/MYLANTA) 200-200-20 MG/5ML suspension 30 mL (30 mLs Oral Given 05/27/20 1458)    ED Course  I have reviewed the  triage vital signs and the nursing notes.  Pertinent labs & imaging results that were available during my care of the patient were reviewed by me and considered in my medical decision making (see chart for details).    MDM Rules/Calculators/A&P                          Patient with symptoms consistent with gastritis.  Likely /alcoholic in nature.  Vitals are stable, no fever or tachycardia.  Patient is nontoxic, nonseptic appearing, in no apparent distress.  Patient does not meet the SIRS or Sepsis criteria.  Pt's symptoms have been managed in the department; fluid bolus given, along with Zofran, Maalox, and Protonix.  No signs of dehydration, tolerating PO fluids > 6 oz.  Lungs are clear.  No focal abdominal pain, no peritoneal signs, no concern for appendicitis, cholecystitis, pancreatitis, ruptured viscus, UTI, kidney stone. Patient refused UA but does not have any urinary symptoms.  Supportive therapy indicated.    Return precautions were discussed with patient who states their understanding.  At the time of discharge patient denied any unaddressed complaints or concerns.  Patient is agreeable for discharge home.  Note: Portions of this report may have been transcribed using voice recognition software. Every effort was made to ensure accuracy; however, inadvertent computerized transcription errors may be present  Final Clinical Impression(s) / ED Diagnoses Final diagnoses:  Acute alcoholic gastritis, presence of bleeding unspecified  Non-intractable vomiting with nausea, unspecified vomiting type    Rx / DC Orders ED Discharge Orders         Ordered     sucralfate (CARAFATE) 1 g tablet  3 times daily with meals & bedtime        05/27/20 1600    ondansetron (ZOFRAN ODT) 4 MG disintegrating tablet  Every 8 hours PRN        05/27/20 1600           Ollen Gross 05/27/20 2303    Nat Christen, MD 05/31/20 1724

## 2020-05-27 NOTE — Discharge Instructions (Addendum)
I suspect that your upset stomach today is related to drinking large amounts of alcohol.    Today you were treated with IV fluids, stomach acid medicine, Maalox, Zofran.  You were given prescriptions for Zofran to use at home as needed for nausea and vomiting so that you can keep yourself hydrated, in addition to a prescription for Carafate to help coat the walls of your stomach to decrease inflammation.  Please make sure that you are drinking plenty of fluids to stay well-hydrated.  Please eat a bland diet for the next 2 days and then you can begin reintroducing normal foods slowly if you are not having any vomiting or other symptoms.

## 2020-05-27 NOTE — ED Triage Notes (Signed)
Patient reports he got "very drunk" on Friday night and has been nauseated and vomiting since Saturday.Patient denies drug use. Patient denies chest pain or SOB

## 2020-05-30 ENCOUNTER — Emergency Department (HOSPITAL_COMMUNITY)
Admission: EM | Admit: 2020-05-30 | Discharge: 2020-05-30 | Disposition: A | Discharge status: Home / Self Care | Payer: Medicaid Other | Attending: Emergency Medicine | Admitting: Emergency Medicine

## 2020-05-30 ENCOUNTER — Other Ambulatory Visit: Payer: Self-pay

## 2020-05-30 DIAGNOSIS — R1110 Vomiting, unspecified: Secondary | ICD-10-CM | POA: Insufficient documentation

## 2020-05-30 DIAGNOSIS — Z5321 Procedure and treatment not carried out due to patient leaving prior to being seen by health care provider: Secondary | ICD-10-CM | POA: Diagnosis not present

## 2020-05-30 LAB — COMPREHENSIVE METABOLIC PANEL
ALT: 25 U/L (ref 0–44)
AST: 27 U/L (ref 15–41)
Albumin: 4.7 g/dL (ref 3.5–5.0)
Alkaline Phosphatase: 70 U/L (ref 38–126)
Anion gap: 9 (ref 5–15)
BUN: 11 mg/dL (ref 6–20)
CO2: 28 mmol/L (ref 22–32)
Calcium: 9.2 mg/dL (ref 8.9–10.3)
Chloride: 100 mmol/L (ref 98–111)
Creatinine, Ser: 0.95 mg/dL (ref 0.61–1.24)
GFR calc non Af Amer: >60 mL/min (ref >60)
Glucose, Bld: 122 mg/dL — ABNORMAL HIGH (ref 70–99)
Potassium: 3.4 mmol/L — ABNORMAL LOW (ref 3.5–5.1)
Sodium: 137 mmol/L (ref 135–145)
Total Bilirubin: 0.8 mg/dL (ref 0.3–1.2)
Total Protein: 7.9 g/dL (ref 6.5–8.1)

## 2020-05-30 LAB — CBC
HCT: 42.4 % (ref 39.0–52.0)
Hemoglobin: 15.9 g/dL (ref 13.0–17.0)
MCH: 33.1 pg (ref 26.0–34.0)
MCHC: 37.5 g/dL — ABNORMAL HIGH (ref 30.0–36.0)
MCV: 88.3 fL (ref 80.0–100.0)
Platelets: 151 10*3/uL (ref 150–400)
RBC: 4.8 MIL/uL (ref 4.22–5.81)
RDW: 11.2 % — ABNORMAL LOW (ref 11.5–15.5)
WBC: 10.1 10*3/uL (ref 4.0–10.5)
nRBC: 0 % (ref 0.0–0.2)

## 2020-05-30 LAB — LIPASE, BLOOD: Lipase: 22 U/L (ref 11–51)

## 2020-05-30 NOTE — ED Notes (Signed)
Pt stated felt better after vomitting a few times while sitting in lobby. Pt stated he wanted to leave an walked out.

## 2020-05-30 NOTE — ED Provider Notes (Signed)
1721: notified by RN patient was in room and when tech went to get UA patient stated he was leaving because he felt better. I did not see or evaluate the patient.    Kinnie Feil, PA-C 05/30/20 1721    Sherwood Gambler, MD 06/12/20 [phone]

## 2020-05-30 NOTE — ED Notes (Signed)
Patient was called to go to a room but no response.

## 2020-05-30 NOTE — ED Notes (Signed)
Patient was called to reassess vital signs but no response. x1

## 2020-05-30 NOTE — ED Triage Notes (Signed)
Pt arrived via walk in, c/o vomiting. States he was recently seen, dc with gastritis, no relief.

## 2020-06-27 ENCOUNTER — Other Ambulatory Visit: Payer: Self-pay

## 2020-06-27 ENCOUNTER — Emergency Department (HOSPITAL_COMMUNITY)
Admission: EM | Admit: 2020-06-27 | Discharge: 2020-06-27 | Disposition: A | Discharge status: Home / Self Care | Payer: Medicaid Other | Attending: Emergency Medicine | Admitting: Emergency Medicine

## 2020-06-27 ENCOUNTER — Encounter (HOSPITAL_COMMUNITY): Payer: Self-pay

## 2020-06-27 DIAGNOSIS — F17210 Nicotine dependence, cigarettes, uncomplicated: Secondary | ICD-10-CM | POA: Diagnosis not present

## 2020-06-27 DIAGNOSIS — R6883 Chills (without fever): Secondary | ICD-10-CM | POA: Diagnosis not present

## 2020-06-27 DIAGNOSIS — R519 Headache, unspecified: Secondary | ICD-10-CM | POA: Insufficient documentation

## 2020-06-27 DIAGNOSIS — R439 Unspecified disturbances of smell and taste: Secondary | ICD-10-CM | POA: Insufficient documentation

## 2020-06-27 DIAGNOSIS — R0602 Shortness of breath: Secondary | ICD-10-CM | POA: Insufficient documentation

## 2020-06-27 DIAGNOSIS — Z20822 Contact with and (suspected) exposure to covid-19: Secondary | ICD-10-CM | POA: Diagnosis not present

## 2020-06-27 DIAGNOSIS — B349 Viral infection, unspecified: Secondary | ICD-10-CM

## 2020-06-27 LAB — RESPIRATORY PANEL BY RT PCR (FLU A&B, COVID)
Influenza A by PCR: NEGATIVE
Influenza B by PCR: NEGATIVE
SARS Coronavirus 2 by RT PCR: NEGATIVE

## 2020-06-27 NOTE — ED Provider Notes (Signed)
[facility] DEPT Provider Note   CSN: [id number] Arrival date & time: 06/27/20  1652     History Chief Complaint  Patient presents with  . Chills  . Headache  . Shortness of Breath  . loss of taste    Philip Joseph is a 29 y.o. male.  29 year old male presents with 1 day history of loss of taste, chills, headache.  Notes slight shortness of breath since this morning.  Denies productive cough.  No vomiting or diarrhea.  Patient history of Crohn's disease and he has not been vaccinated against Covid.  No treatment use prior to arrival        Past Medical History:  Diagnosis Date  . ADHD (attention deficit hyperactivity disorder)   . Crohn's ileitis ([place]) 12/19/2013  . IBS (irritable bowel syndrome)   . Long term current use of systemic steroids 11/27/2015  . 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 Unspecified vitamin D deficiency 12/21/2013    Patient Active Problem List   Diagnosis Date Noted  . Long-term use of immunosuppressant medication- Remicade 12/09/2016  . 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 Smoker 11/29/2014  . Vitamin D deficiency 12/21/2013  . 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 Attention deficit hyperactivity disorder (ADHD) 04/22/2010    Past Surgical History:  Procedure Laterality Date  . COLONOSCOPY N/A 12/20/2013   Procedure: COLONOSCOPY;  Surgeon: [name], MD;  Location: [facility];  Service: Endoscopy;  Laterality: N/A;  . COLONOSCOPY    . WRIST FRACTURE SURGERY         Family History  Problem Relation Age of Onset  . Prostate cancer Father     Social History   Tobacco Use  . Smoking status: Current Some Day Smoker    Types: Cigarettes  . Smokeless tobacco: Never Used  Vaping Use  . Vaping Use: Never used  Substance Use Topics  . Alcohol use: Yes    Alcohol/week: 0.0 standard drinks  . Drug use: Not Currently     Types: Marijuana    Home Medications Prior to Admission medications   Medication Sig Start Date End Date Taking? Authorizing Provider  dicyclomine (BENTYL) 20 MG tablet Take 1 tablet (20 mg total) by mouth 2 (two) times daily as needed for spasms. 02/23/20   [name], MD  InFLIXimab (REMICADE IV) Inject 300 mg into the vein every 8 (eight) weeks.     [provider]  ondansetron (ZOFRAN ODT) 4 MG disintegrating tablet Take 1 tablet (4 mg total) by mouth every 8 (eight) hours as needed for nausea or vomiting. 05/27/20   Lorin Glass, PA-C  sucralfate (CARAFATE) 1 g tablet Take 1 tablet (1 g total) by mouth 4 (four) times daily -  with meals and at bedtime for 7 days. 05/27/20 06/03/20  Lorin Glass, PA-C    Allergies    Penicillins  Review of Systems   Review of Systems  All other systems reviewed and are negative.   Physical Exam Updated Vital Signs BP 124/70 (BP Location: Right Arm)   Pulse 70   Temp 98.5 F (36.9 C) (Oral)   Resp 16   Ht 1.753 m (5' 9" )   Wt 70.3 kg   SpO2 100%   BMI 22.89 kg/m   Physical Exam Vitals and nursing note reviewed.  Constitutional:      General: He is not in acute distress.    Appearance: Normal  appearance. He is well-developed. He is not toxic-appearing.  HENT:     Head: Normocephalic and atraumatic.  Eyes:     General: Lids are normal.     Conjunctiva/sclera: Conjunctivae normal.     Pupils: Pupils are equal, round, and reactive to light.  Neck:     Thyroid: No thyroid mass.     Trachea: No tracheal deviation.  Cardiovascular:     Rate and Rhythm: Normal rate and regular rhythm.     Heart sounds: Normal heart sounds. No murmur heard.  No gallop.   Pulmonary:     Effort: Pulmonary effort is normal. No respiratory distress.     Breath sounds: Normal breath sounds. No stridor. No decreased breath sounds, wheezing, rhonchi or rales.  Abdominal:     General: Bowel sounds are normal. There is no distension.       Palpations: Abdomen is soft.     Tenderness: There is no abdominal tenderness. There is no rebound.  Musculoskeletal:        General: No tenderness. Normal range of motion.     Cervical back: Normal range of motion and neck supple.  Skin:    General: Skin is warm and dry.     Findings: No abrasion or rash.  Neurological:     Mental Status: He is alert and oriented to person, place, and time.     GCS: GCS eye subscore is 4. GCS verbal subscore is 5. GCS motor subscore is 6.     Cranial Nerves: No cranial nerve deficit.     Sensory: No sensory deficit.  Psychiatric:        Speech: Speech normal.        Behavior: Behavior normal.     ED Results / Procedures / Treatments   Labs (all labs ordered are listed, but only abnormal results are displayed) Labs Reviewed  RESPIRATORY PANEL BY RT PCR (FLU A&B, COVID)    EKG None  Radiology No results found.  Procedures Procedures (including critical care time)  Medications Ordered in ED Medications - No data to display  ED Course  I have reviewed the triage vital signs and the nursing notes.  Pertinent labs & imaging results that were available during my care of the patient were reviewed by me and considered in my medical decision making (see chart for details).    MDM Rules/Calculators/A&P                          Patient with stable vital signs here.  Suspect COVID-19 infection.  Will have nurses obtain swab here and follow-up instructions given Final Clinical Impression(s) / ED Diagnoses Final diagnoses:  None    Rx / DC Orders ED Discharge Orders    None       Lacretia Leigh, MD 06/27/20 1721

## 2020-06-27 NOTE — ED Triage Notes (Signed)
Patient reports having chills, loss of taste, headache, and SOB since this AM.

## 2020-06-27 NOTE — Discharge Instructions (Signed)
Please sign up with my chart so that you can get your Covid results

## 2020-07-02 ENCOUNTER — Ambulatory Visit (HOSPITAL_COMMUNITY)
Admission: RE | Admit: 2020-07-02 | Discharge: 2020-07-02 | Disposition: A | Discharge status: Home / Self Care | Payer: Medicaid Other | Source: Ambulatory Visit | Attending: Internal Medicine | Admitting: Internal Medicine

## 2020-07-02 ENCOUNTER — Other Ambulatory Visit: Payer: Self-pay

## 2020-07-02 DIAGNOSIS — K5000 Crohn's disease of small intestine without complications: Secondary | ICD-10-CM | POA: Insufficient documentation

## 2020-07-02 MED ORDER — DIPHENHYDRAMINE HCL 25 MG PO CAPS
50.0000 mg | ORAL_CAPSULE | Freq: Once | ORAL | Status: AC
  Administered 2020-07-02: 50 mg via ORAL
  Filled 2020-07-02: qty 2

## 2020-07-02 MED ORDER — ACETAMINOPHEN 325 MG PO TABS
650.0000 mg | ORAL_TABLET | Freq: Once | ORAL | Status: AC
  Administered 2020-07-02: 650 mg via ORAL
  Filled 2020-07-02: qty 2

## 2020-07-02 MED ORDER — SODIUM CHLORIDE 0.9 % IV SOLN
300.0000 mg | INTRAVENOUS | Status: DC
  Administered 2020-07-02: 300 mg via INTRAVENOUS
  Filled 2020-07-02: qty 30

## 2020-07-02 MED ORDER — SODIUM CHLORIDE 0.9 % IV SOLN
INTRAVENOUS | Status: DC | PRN
  Administered 2020-07-02: 250 mL via INTRAVENOUS

## 2020-07-02 NOTE — Progress Notes (Signed)
PATIENT CARE CENTER NOTE  Diagnosis: Crohn's disease of ileum without complication   Provider: Silvano Rusk MD   Procedure: Remicade IV   Note: Patient received Remicade infusion. Pre medications given per order. Tolerated well, vitals stable, discharge instructions given, declined printed AVS.  Patient alert, oriented and ambulatory at the time of discharge.

## 2020-07-02 NOTE — Discharge Instructions (Signed)
Infliximab injection What is this medicine? INFLIXIMAB (in [place] i mab) is used to treat Crohn's disease and ulcerative colitis. It is also used to treat ankylosing spondylitis, plaque psoriasis, and some forms of arthritis. This medicine may be used for other purposes; ask your health care provider or pharmacist if you have questions. COMMON BRAND NAME(S): AVSOLA, INFLECTRA, Remicade, RENFLEXIS What should I tell my health care provider before I take this medicine? They need to know if you have any of these conditions:  cancer  current or past resident of [place] or [place]  diabetes  exposure to tuberculosis  Guillain-Barre syndrome  heart failure  hepatitis or liver disease  immune system problems  infection  lung or breathing disease, like COPD  multiple sclerosis  receiving phototherapy for the skin  seizure disorder  an unusual or allergic reaction to infliximab, mouse proteins, other medicines, foods, dyes, or preservatives  pregnant or trying to get pregnant  breast-feeding How should I use this medicine? This medicine is for injection into a vein. It is usually given by a health care professional in a hospital or clinic setting. A special MedGuide will be given to you by the pharmacist with each prescription and refill. Be sure to read this information carefully each time. Talk to your pediatrician regarding the use of this medicine in children. While this drug may be prescribed for children as young as 1 years of age for selected conditions, precautions do apply. Overdosage: If you think you have taken too much of this medicine contact a poison control center or emergency room at once. NOTE: This medicine is only for you. Do not share this medicine with others. What if I miss a dose? It is important not to miss your dose. Call your doctor or health care professional if you are unable to keep an appointment. What may interact with this  medicine? Do not take this medicine with any of the following medications:  biologic medicines such as abatacept, adalimumab, anakinra, certolizumab, etanercept, golimumab, rituximab, secukinumab, tocilizumab, tofactinib, ustekinumab  live vaccines This list may not describe all possible interactions. Give your health care provider a list of all the medicines, herbs, non-prescription drugs, or dietary supplements you use. Also tell them if you smoke, drink alcohol, or use illegal drugs. Some items may interact with your medicine. What should I watch for while using this medicine? Your condition will be monitored carefully while you are receiving this medicine. Visit your doctor or health care professional for regular checks on your progress. You may need blood work done while you are taking this medicine. Before beginning therapy, your doctor may do a test to see if you have been exposed to tuberculosis. Call your doctor or health care professional for advice if you get a fever, chills or sore throat, or other symptoms of a cold or flu. Do not treat yourself. This drug decreases your body's ability to fight infections. Try to avoid being around people who are sick. This medicine may make the symptoms of heart failure worse in some patients. If you notice symptoms such as increased shortness of breath or swelling of the ankles or legs, contact your health care provider right away. If you are going to have surgery or dental work, tell your health care professional or dentist that you have received this medicine. If you take this medicine for plaque psoriasis, stay out of the sun. If you cannot avoid being in the sun, wear protective clothing and use sunscreen. Do  not use sun lamps or tanning beds/booths. Talk to your doctor about your risk of cancer. You may be more at risk for certain types of cancers if you take this medicine. What side effects may I notice from receiving this medicine? Side effects  that you should report to your doctor or health care professional as soon as possible:  allergic reactions like skin rash, itching or hives, swelling of the face, lips, or tongue  breathing problems  changes in vision  chest pain  fever or chills, usually related to the infusion  joint pain  pain, tingling, numbness in the hands or feet  redness, blistering, peeling or loosening of the skin, including inside the mouth  seizures  signs of infection - fever or chills, cough, sore throat, flu-like symptoms, pain or difficulty passing urine  signs and symptoms of liver injury like dark yellow or brown urine; general ill feeling; light-colored stools; loss of appetite; nausea; right upper belly pain; unusually weak or tired; yellowing of the eyes or skin  signs and symptoms of a stroke like changes in vision; confusion; trouble speaking or understanding; severe headaches; sudden numbness or weakness of the face, arm or leg; trouble walking; dizziness; loss of balance or coordination  swelling of the ankles, feet, or hands  swollen lymph nodes in the neck, underarm, or groin areas  unusual bleeding or bruising  unusually weak or tired Side effects that usually do not require medical attention (report to your doctor or health care professional if they continue or are bothersome):  headache  nausea  stomach pain  upset stomach This list may not describe all possible side effects. Call your doctor for medical advice about side effects. You may report side effects to FDA at 1-800-FDA-1088. Where should I keep my medicine? This drug is given in a hospital or clinic and will not be stored at home. NOTE: This sheet is a summary. It may not cover all possible information. If you have questions about this medicine, talk to your doctor, pharmacist, or health care provider.  2020 Elsevier/Gold Standard (2016-09-10 13:45:32)

## 2020-11-22 ENCOUNTER — Telehealth: Payer: Self-pay | Admitting: Internal Medicine

## 2020-11-22 NOTE — Telephone Encounter (Signed)
I spoke with Quintavis and he said he got his rx already. However while talking to him I made him a May appointment for his yearly check.

## 2021-01-01 ENCOUNTER — Ambulatory Visit (INDEPENDENT_AMBULATORY_CARE_PROVIDER_SITE_OTHER): Payer: Medicaid Other | Admitting: Internal Medicine

## 2021-01-01 ENCOUNTER — Other Ambulatory Visit: Payer: Self-pay | Admitting: Internal Medicine

## 2021-01-01 ENCOUNTER — Encounter: Payer: Self-pay | Admitting: Internal Medicine

## 2021-01-01 ENCOUNTER — Other Ambulatory Visit (INDEPENDENT_AMBULATORY_CARE_PROVIDER_SITE_OTHER): Payer: Medicaid Other

## 2021-01-01 VITALS — BP 100/64 | HR 80 | Ht 67.5 in | Wt 129.5 lb

## 2021-01-01 DIAGNOSIS — Z79899 Other long term (current) drug therapy: Secondary | ICD-10-CM

## 2021-01-01 DIAGNOSIS — E559 Vitamin D deficiency, unspecified: Secondary | ICD-10-CM

## 2021-01-01 DIAGNOSIS — F439 Reaction to severe stress, unspecified: Secondary | ICD-10-CM

## 2021-01-01 DIAGNOSIS — R634 Abnormal weight loss: Secondary | ICD-10-CM

## 2021-01-01 DIAGNOSIS — K5000 Crohn's disease of small intestine without complications: Secondary | ICD-10-CM

## 2021-01-01 DIAGNOSIS — F71 Moderate intellectual disabilities: Secondary | ICD-10-CM

## 2021-01-01 DIAGNOSIS — F17200 Nicotine dependence, unspecified, uncomplicated: Secondary | ICD-10-CM

## 2021-01-01 DIAGNOSIS — Z7960 Long term (current) use of unspecified immunomodulators and immunosuppressants: Secondary | ICD-10-CM

## 2021-01-01 LAB — SEDIMENTATION RATE: Sed Rate: 6 mm/hr (ref 0–15)

## 2021-01-01 LAB — CBC WITH DIFFERENTIAL/PLATELET
Basophils Absolute: 0 10*3/uL (ref 0.0–0.1)
Basophils Relative: 0.4 % (ref 0.0–3.0)
Eosinophils Absolute: 0.1 10*3/uL (ref 0.0–0.7)
Eosinophils Relative: 1 % (ref 0.0–5.0)
HCT: 48.4 % (ref 39.0–52.0)
Hemoglobin: 17.1 g/dL — ABNORMAL HIGH (ref 13.0–17.0)
Lymphocytes Relative: 38 % (ref 12.0–46.0)
Lymphs Abs: 2.8 10*3/uL (ref 0.7–4.0)
MCHC: 35.4 g/dL (ref 30.0–36.0)
MCV: 91.1 fl (ref 78.0–100.0)
Monocytes Absolute: 0.5 10*3/uL (ref 0.1–1.0)
Monocytes Relative: 6.8 % (ref 3.0–12.0)
Neutro Abs: 4 10*3/uL (ref 1.4–7.7)
Neutrophils Relative %: 53.8 % (ref 43.0–77.0)
Platelets: 196 10*3/uL (ref 150.0–400.0)
RBC: 5.32 Mil/uL (ref 4.22–5.81)
RDW: 12.6 % (ref 11.5–15.5)
WBC: 7.5 10*3/uL (ref 4.0–10.5)

## 2021-01-01 LAB — COMPREHENSIVE METABOLIC PANEL
ALT: 8 U/L (ref 0–53)
AST: 16 U/L (ref 0–37)
Albumin: 5.3 g/dL — ABNORMAL HIGH (ref 3.5–5.2)
Alkaline Phosphatase: 88 U/L (ref 39–117)
BUN: 11 mg/dL (ref 6–23)
CO2: 29 mEq/L (ref 19–32)
Calcium: 10.1 mg/dL (ref 8.4–10.5)
Chloride: 99 mEq/L (ref 96–112)
Creatinine, Ser: 1.03 mg/dL (ref 0.40–1.50)
GFR: 98.63 mL/min (ref >60.00)
Glucose, Bld: 91 mg/dL (ref 70–99)
Potassium: 3.5 mEq/L (ref 3.5–5.1)
Sodium: 137 mEq/L (ref 135–145)
Total Bilirubin: 1.2 mg/dL (ref 0.2–1.2)
Total Protein: 8.4 g/dL — ABNORMAL HIGH (ref 6.0–8.3)

## 2021-01-01 LAB — VITAMIN D 25 HYDROXY (VIT D DEFICIENCY, FRACTURES): VITD: 14.49 ng/mL — ABNORMAL LOW (ref 30.00–100.00)

## 2021-01-01 LAB — C-REACTIVE PROTEIN: CRP: <1 mg/dL (ref 0.5–20.0)

## 2021-01-01 NOTE — Patient Instructions (Signed)
Your provider has requested that you go to the basement level for lab work before leaving today. Press "B" on the elevator. The lab is located at the first door on the left as you exit the elevator.  Due to recent changes in healthcare laws, you may see the results of your imaging and laboratory studies on MyChart before your provider has had a chance to review them.  We understand that in some cases there may be results that are confusing or concerning to you. Not all laboratory results come back in the same time frame and the provider may be waiting for multiple results in order to interpret others.  Please give [place] 48 hours in order for your provider to thoroughly review all the results before contacting the office for clarification of your results.   You will be contacted by [facility] in the next 2 days to arrange a MRI.Marland Kitchen  The number on your caller ID will be [phone], please answer when they call.  If you have not heard from them in 2 days please call [phone] to schedule.     I appreciate the opportunity to care for you. Silvano Rusk, MD, [facility]

## 2021-01-01 NOTE — Progress Notes (Signed)
Philip Joseph 30 y.o. 04/27/1991 [id number]  Assessment & Plan:   Encounter Diagnoses  Name Primary?  . Crohn's disease of ileum without complication ([place]) Yes  . Long-term use of immunosuppressant medication- Remicade   . Smoker   . Situational stress   . Loss of weight     Orders Placed This Encounter  Procedures  . MR ENTERO ABDOMEN W WO CONTRAST  . MR ENTERO PELVIS W WO CONTRAST    I need to reassess things as above with imaging.  He should move forward with Remicade restart. Weight loss is concerning for disease activity.  Could be from stress.  Could be from substance abuse hopefully he will remain abstinent.  He did labs today that included a CMET that was normal except for an albumin of 5.3 and a total protein of 8.4.  Vitamin D level very low at 14.49.  This will be treated.  Hemoglobin 17.1 white count normal platelets normal MCV normal Sed rate was 6 QuantiFERON negative hepatitis B surface antigen and surface antibody nonreactive  We will consider hepatitis B booster Subjective:   Chief Complaint:  HPI The patient is here for follow-up with a history of Crohn's ileitis treated with infliximab infusion, last seen in May 2021, he has stopped his therapy.  He is scheduled for retreatment in July.  He reports he is doing well though he has lost weight, see below.  Stopped EtOH Stopped marijuana Still smoking   He has been in and out of the ER a few times last year in October he was thought to have alcoholic gastritis and a viral illness in November. He is living with his mother, his son lives with his mother and the patient says this is stressful, "baby mama drama".  Unemployed/disabled  Wt Readings from Last 3 Encounters:  01/01/21 129 lb 8 oz (58.7 kg)  06/27/20 155 lb (70.3 kg)  05/30/20 155 lb (70.3 kg)    Allergies  Allergen Reactions  . Penicillins Rash    Has patient had a PCN reaction causing immediate rash, facial/tongue/throat  swelling, SOB or lightheadedness with hypotension: No Has patient had a PCN reaction causing severe rash involving mucus membranes or skin necrosis: No Has patient had a PCN reaction that required hospitalization No Has patient had a PCN reaction occurring within the last 10 years: No If all of the above answers are "NO", then may proceed with Cephalosporin use.    Current Meds  Medication Sig  . dicyclomine (BENTYL) 20 MG tablet Take 1 tablet (20 mg total) by mouth 2 (two) times daily as needed for spasms.  . InFLIXimab (REMICADE IV) Inject 300 mg into the vein every 8 (eight) weeks.    Past Medical History:  Diagnosis Date  . ADHD (attention deficit hyperactivity disorder)   . Crohn's ileitis ([place]) 12/19/2013  . IBS (irritable bowel syndrome)   . Long term current use of systemic steroids 11/27/2015  . 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 Unspecified vitamin D deficiency 12/21/2013   Past Surgical History:  Procedure Laterality Date  . COLONOSCOPY N/A 12/20/2013   Procedure: COLONOSCOPY;  Surgeon: [name], MD;  Location: [facility];  Service: Endoscopy;  Laterality: N/A;  . COLONOSCOPY    . WRIST FRACTURE SURGERY     Social History   Social History Narrative   Single, 1 son born 2018.   Lives w/ mom   + Smoker of tobacco and marijuana   Disability ?   family history  includes Prostate cancer in his father.   Review of Systems  As above Objective:   Physical Exam BP 100/64 (BP Location: Left Arm, Patient Position: Sitting, Cuff Size: Normal)   Pulse 80   Ht 5' 7.5" (1.715 m) Comment: height measured without shoes  Wt 129 lb 8 oz (58.7 kg)   BMI 19.98 kg/m  Thin NAD Lungs cta Cor NL abd thin soft NT Perianal/rectal NL

## 2021-01-03 ENCOUNTER — Other Ambulatory Visit: Payer: Self-pay

## 2021-01-03 ENCOUNTER — Other Ambulatory Visit: Payer: Self-pay | Admitting: Internal Medicine

## 2021-01-03 LAB — HEPATITIS B SURFACE ANTIBODY,QUALITATIVE: Hep B S Ab: NONREACTIVE

## 2021-01-03 LAB — QUANTIFERON-TB GOLD PLUS
Mitogen-NIL: >10 IU/mL
NIL: 0.06 IU/mL
QuantiFERON-TB Gold Plus: NEGATIVE
TB1-NIL: 0.01 IU/mL
TB2-NIL: 0 IU/mL

## 2021-01-03 LAB — HEPATITIS B SURFACE ANTIGEN: Hepatitis B Surface Ag: NONREACTIVE

## 2021-01-03 MED ORDER — VITAMIN D (ERGOCALCIFEROL) 1.25 MG (50000 UNIT) PO CAPS: 50000.0000 [IU] | ORAL_CAPSULE | ORAL | 0 refills | Status: DC

## 2021-01-15 ENCOUNTER — Ambulatory Visit (HOSPITAL_COMMUNITY)
Admission: RE | Admit: 2021-01-15 | Discharge: 2021-01-15 | Disposition: A | Discharge status: Home / Self Care | Payer: Medicaid Other | Source: Ambulatory Visit | Attending: Internal Medicine | Admitting: Internal Medicine

## 2021-01-15 ENCOUNTER — Other Ambulatory Visit: Payer: Self-pay

## 2021-01-15 DIAGNOSIS — K5000 Crohn's disease of small intestine without complications: Secondary | ICD-10-CM | POA: Diagnosis present

## 2021-01-15 MED ORDER — BARIUM SULFATE 0.1 % PO SUSP
450.0000 mL | Freq: Once | ORAL | Status: AC
  Administered 2021-01-15: 450 mL via ORAL

## 2021-01-15 MED ORDER — GADOBUTROL 1 MMOL/ML IV SOLN
6.0000 mL | Freq: Once | INTRAVENOUS | Status: AC | PRN
  Administered 2021-01-15: 6 mL via INTRAVENOUS

## 2021-01-18 ENCOUNTER — Other Ambulatory Visit: Payer: Self-pay

## 2021-01-18 DIAGNOSIS — K5000 Crohn's disease of small intestine without complications: Secondary | ICD-10-CM

## 2021-01-18 MED ORDER — INFLIXIMAB 100 MG IV SOLR: INTRAVENOUS | 7 refills | Status: AC

## 2021-02-23 ENCOUNTER — Other Ambulatory Visit: Payer: Self-pay | Admitting: Internal Medicine

## 2021-03-01 ENCOUNTER — Non-Acute Institutional Stay (HOSPITAL_COMMUNITY)
Admission: RE | Admit: 2021-03-01 | Discharge: 2021-03-01 | Disposition: A | Discharge status: Home / Self Care | Payer: Medicaid Other | Source: Ambulatory Visit | Attending: Internal Medicine | Admitting: Internal Medicine

## 2021-03-01 ENCOUNTER — Other Ambulatory Visit: Payer: Self-pay

## 2021-03-01 DIAGNOSIS — K5000 Crohn's disease of small intestine without complications: Secondary | ICD-10-CM | POA: Insufficient documentation

## 2021-03-01 MED ORDER — ACETAMINOPHEN 325 MG PO TABS
650.0000 mg | ORAL_TABLET | Freq: Every day | ORAL | Status: DC
Start: 2021-03-01 — End: 2021-03-02
  Administered 2021-03-01: 650 mg via ORAL
  Filled 2021-03-01: qty 2

## 2021-03-01 MED ORDER — SODIUM CHLORIDE 0.9 % IV SOLN
5.0000 mg/kg | Freq: Once | INTRAVENOUS | Status: AC
  Administered 2021-03-01: 300 mg via INTRAVENOUS
  Filled 2021-03-01: qty 30

## 2021-03-01 MED ORDER — SODIUM CHLORIDE 0.9 % IV SOLN
INTRAVENOUS | Status: DC | PRN
  Administered 2021-03-01: 250 mL via INTRAVENOUS

## 2021-03-01 MED ORDER — DIPHENHYDRAMINE HCL 25 MG PO CAPS
25.0000 mg | ORAL_CAPSULE | Freq: Every day | ORAL | Status: DC
  Administered 2021-03-01: 25 mg via ORAL
  Filled 2021-03-01: qty 1

## 2021-03-01 NOTE — Progress Notes (Signed)
PATIENT CARE CENTER NOTE   Diagnosis: Crohn's disease of ileum without complication ([place]) (O13.08)   Provider: Silvano Rusk, MD   Procedure: Remicade infusion    Note:  Patient previously on Remicade and is now restarting infusions. Patient to received Remicade at week 0/2/6 then every 8 weeks. Patient received first loading dose (week 0) of Remicade. Pre-medications given (Tylenol and Benadryl) per order. Infusion titrated per protocol. Patient tolerated infusion well. Vital signs remained stable. Patient observed for 1 hour post infusion. Discharge instructions given. Patient to come back in 2 weeks for next loading dose of Remicade. Patient alert, oriented and ambulatory at discharge.

## 2021-03-12 ENCOUNTER — Other Ambulatory Visit (INDEPENDENT_AMBULATORY_CARE_PROVIDER_SITE_OTHER): Payer: Medicaid Other

## 2021-03-12 ENCOUNTER — Ambulatory Visit (INDEPENDENT_AMBULATORY_CARE_PROVIDER_SITE_OTHER): Payer: Medicaid Other | Admitting: Internal Medicine

## 2021-03-12 ENCOUNTER — Encounter: Payer: Self-pay | Admitting: Internal Medicine

## 2021-03-12 VITALS — BP 98/64 | HR 78 | Ht 69.0 in | Wt 130.8 lb

## 2021-03-12 DIAGNOSIS — K50012 Crohn's disease of small intestine with intestinal obstruction: Secondary | ICD-10-CM

## 2021-03-12 DIAGNOSIS — Z7960 Long term (current) use of unspecified immunomodulators and immunosuppressants: Secondary | ICD-10-CM

## 2021-03-12 DIAGNOSIS — Z79899 Other long term (current) drug therapy: Secondary | ICD-10-CM

## 2021-03-12 DIAGNOSIS — K56699 Other intestinal obstruction unspecified as to partial versus complete obstruction: Secondary | ICD-10-CM | POA: Diagnosis not present

## 2021-03-12 DIAGNOSIS — F439 Reaction to severe stress, unspecified: Secondary | ICD-10-CM

## 2021-03-12 DIAGNOSIS — E559 Vitamin D deficiency, unspecified: Secondary | ICD-10-CM

## 2021-03-12 DIAGNOSIS — R634 Abnormal weight loss: Secondary | ICD-10-CM

## 2021-03-12 LAB — VITAMIN D 25 HYDROXY (VIT D DEFICIENCY, FRACTURES): VITD: 33.5 ng/mL (ref 30.00–100.00)

## 2021-03-12 MED ORDER — DICYCLOMINE HCL 20 MG PO TABS: 20.0000 mg | ORAL_TABLET | Freq: Four times a day (QID) | ORAL | 5 refills | Status: AC | PRN

## 2021-03-12 NOTE — Patient Instructions (Signed)
  If you are age 29 or younger, your body mass index should be between 19-25. Your Body mass index is 19.32 kg/m. If this is out of the aformentioned range listed, please consider follow up with your Primary Care Provider.   __________________________________________________________  The [facility] providers would like to encourage you to use [facility] to communicate with providers for non-urgent requests or questions.  Due to long hold times on the telephone, sending your provider a message by [facility] may be a faster and more efficient way to get a response.  Please allow 48 business hours for a response.  Please remember that this is for non-urgent requests.    Your provider has requested that you go to the basement level for lab work before leaving today. Press "B" on the elevator. The lab is located at the first door on the left as you exit the elevator.  Due to recent changes in healthcare laws, you may see the results of your imaging and laboratory studies on MyChart before your provider has had a chance to review them.  We understand that in some cases there may be results that are confusing or concerning to you. Not all laboratory results come back in the same time frame and the provider may be waiting for multiple results in order to interpret others.  Please give [place] 48 hours in order for your provider to thoroughly review all the results before contacting the office for clarification of your results.   We have sent the following medications to your pharmacy for you to pick up at your convenience: Dicyclomine  I appreciate the opportunity to care for you. Silvano Rusk, MD, [facility]

## 2021-03-12 NOTE — Progress Notes (Signed)
Philip Joseph 30 y.o. Dec 30, 1990 [id number]  Assessment & Plan:   Encounter Diagnoses  Name Primary?   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  His weight is essentially stable.  He has had 1 dose of a reload of Remicade.  He will continue that.  I will recheck his vitamin D level today.   I will see him back in about 6 months sooner if needed  Dicyclomine refilled  Subjective:   Chief Complaint: Follow-up of Crohn's disease requesting refill on dicyclomine  HPI The patient is here for follow-up of his Crohn's disease with ileal stricture.  MRI after May visit demonstrated persistent stricturing without other complications.  He had gotten off his Remicade he has restarted it and had one of his reloading doses.  His vitamin D level was very low and I treated that.  He reports he is seeing his son and that stress is a little bit better.  His weight is essentially the same. Wt Readings from Last 3 Encounters:  03/12/21 130 lb 12.8 oz (59.3 kg)  01/01/21 129 lb 8 oz (58.7 kg)  06/27/20 155 lb (70.3 kg)   MR ENTERO PELVIS W WO CONTRAST CLINICAL DATA:  Crohn's disease. Continued loss of weight and abdominal pain  EXAM: MR ABDOMEN AND PELVIS WITHOUT AND WITH CONTRAST (MR ENTEROGRAPHY)   IMPRESSION: 1. Similar appearance of short segment of terminal ileum with mild wall thickening and mucosal enhancement compatible with residual terminal ileitis secondary to Crohn's disease. No signs of bowel obstruction, penetrating disease or abscess. 2. Large stool burden throughout the colon up to the level of the rectum.  Electronically Signed   By: Kerby Moors M.D.   On: 01/16/2021  16:23  Last vitamin D Lab Results  Component Value Date   VD25OH 14.49 (L) 01/01/2021   Lab Results  Component Value Date   WBC 7.5 01/01/2021   HGB 17.1 (H) 01/01/2021   HCT 48.4 01/01/2021   MCV 91.1 01/01/2021   PLT 196.0 01/01/2021   Lab Results  Component Value Date   CREATININE 1.03 01/01/2021   BUN 11 01/01/2021   NA 137 01/01/2021   K 3.5 01/01/2021   CL 99 01/01/2021   CO2 29 01/01/2021   Lab Results  Component Value Date   ALT 8 01/01/2021   AST 16 01/01/2021   ALKPHOS 88 01/01/2021   BILITOT 1.2 01/01/2021      Allergies  Allergen Reactions   Penicillins Rash    Has patient had a PCN reaction causing immediate rash, facial/tongue/throat swelling, SOB or lightheadedness with hypotension: No Has patient had a PCN reaction causing severe rash involving mucus membranes or skin necrosis: No Has patient had a PCN reaction that required hospitalization No Has patient had a PCN reaction occurring within the last 10 years: No If all of the above answers are "NO", then may proceed with Cephalosporin use.    Current Meds  Medication Sig   dicyclomine (BENTYL) 20 MG tablet Take 1 tablet (20 mg total) by mouth 2 (two) times daily as needed for spasms.   inFLIXimab (REMICADE) 100 MG injection Remicade 5 mg/kg IV on week 0,2,6 then q 8 weeks  ondansetron (ZOFRAN ODT) 4 MG disintegrating tablet Take 1 tablet (4 mg total) by mouth every 8 (eight) hours as needed for nausea or vomiting.   Vitamin D, Ergocalciferol, (DRISDOL) 1.25 MG (50000 UNIT) CAPS capsule Take 1 capsule (50,000 Units total) by mouth every 7 (seven) days.   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   Lives w/ mom   + Smoker of tobacco and marijuana   Disability ?   family history includes Prostate cancer in his father.   Review of Systems As above  Objective:   Physical Exam BP 98/64   Pulse 78   Ht 5' 9"  (1.753 m)   Wt 130 lb 12.8 oz (59.3 kg)   SpO2 98%   BMI 19.32 kg/m  Thin well-developed well-nourished black man in no acute distress Abdomen is scaphoid thin there is a little bit of fullness in the right lower quadrant with no mass nontender   Data reviewed include the labs that we did in May and previous MRI results from May of which I have personally reviewed the images

## 2021-03-13 ENCOUNTER — Other Ambulatory Visit: Payer: Self-pay

## 2021-03-13 MED ORDER — VITAMIN D 25 MCG (1000 UNIT) PO TABS: 1000.0000 [IU] | ORAL_TABLET | Freq: Every day | ORAL | 3 refills | Status: DC

## 2021-03-15 ENCOUNTER — Other Ambulatory Visit: Payer: Self-pay

## 2021-03-15 ENCOUNTER — Non-Acute Institutional Stay (HOSPITAL_COMMUNITY)
Admission: RE | Admit: 2021-03-15 | Discharge: 2021-03-15 | Disposition: A | Discharge status: Home / Self Care | Payer: Medicaid Other | Source: Ambulatory Visit | Attending: Internal Medicine | Admitting: Internal Medicine

## 2021-03-15 DIAGNOSIS — K5000 Crohn's disease of small intestine without complications: Secondary | ICD-10-CM | POA: Insufficient documentation

## 2021-03-15 MED ORDER — DIPHENHYDRAMINE HCL 25 MG PO CAPS
25.0000 mg | ORAL_CAPSULE | Freq: Once | ORAL | Status: AC
  Administered 2021-03-15: 25 mg via ORAL
  Filled 2021-03-15: qty 1

## 2021-03-15 MED ORDER — INFLIXIMAB 100 MG IV SOLR
5.0000 mg/kg | Freq: Once | INTRAVENOUS | Status: AC
  Administered 2021-03-15: 300 mg via INTRAVENOUS
  Filled 2021-03-15: qty 30

## 2021-03-15 MED ORDER — SODIUM CHLORIDE 0.9 % IV SOLN
INTRAVENOUS | Status: DC | PRN
  Administered 2021-03-15: 250 mL via INTRAVENOUS

## 2021-03-15 MED ORDER — ACETAMINOPHEN 325 MG PO TABS
650.0000 mg | ORAL_TABLET | Freq: Once | ORAL | Status: AC
  Administered 2021-03-15: 650 mg via ORAL
  Filled 2021-03-15: qty 2

## 2021-03-15 NOTE — Progress Notes (Signed)
PATIENT CARE CENTER NOTE     Diagnosis: Crohn's disease of ileum without complication ([place]) (M87.37)     Provider: Silvano Rusk, MD     Procedure: Remicade infusion      Note:  Patient previously on Remicade and is now restarting infusions. Patient to received Remicade at week 0/2/6 then every 8 weeks. Patient received week 2 loading dose of Remicade. Pre-medications given (Tylenol and Benadryl) per order. Infusion titrated per protocol. Patient tolerated infusion well. Vital signs remained stable. AVS offered but patient refused. Patient to come back in 4 weeks (week 6) for final loading dose of Remicade. Patient alert, oriented and ambulatory at discharge.

## 2021-04-12 ENCOUNTER — Encounter (HOSPITAL_COMMUNITY): Payer: Medicaid Other

## 2021-04-30 ENCOUNTER — Other Ambulatory Visit: Payer: Self-pay

## 2021-04-30 DIAGNOSIS — K50012 Crohn's disease of small intestine with intestinal obstruction: Secondary | ICD-10-CM

## 2021-04-30 MED ORDER — SODIUM CHLORIDE 0.9 % IV SOLN: INTRAVENOUS | Status: AC

## 2021-04-30 MED ORDER — ACETAMINOPHEN 325 MG PO CAPS: 650.0000 mg | ORAL_CAPSULE | Freq: Every day | ORAL | Status: AC

## 2021-04-30 MED ORDER — SODIUM CHLORIDE 0.9 % IV SOLN: 5.0000 mg/kg | Freq: Once | INTRAVENOUS | Status: DC

## 2021-04-30 MED ORDER — SODIUM CHLORIDE 0.9 % IV SOLN: 5.0000 mg/kg | INTRAVENOUS | Status: AC

## 2021-04-30 MED ORDER — DIPHENHYDRAMINE HCL 50 MG PO TABS: 50.0000 mg | ORAL_TABLET | Freq: Every day | ORAL | Status: AC

## 2021-05-01 ENCOUNTER — Non-Acute Institutional Stay (HOSPITAL_COMMUNITY)
Admission: RE | Admit: 2021-05-01 | Discharge: 2021-05-01 | Disposition: A | Discharge status: Home / Self Care | Payer: Medicaid Other | Source: Ambulatory Visit | Attending: Internal Medicine | Admitting: Internal Medicine

## 2021-05-01 ENCOUNTER — Other Ambulatory Visit: Payer: Self-pay

## 2021-05-01 DIAGNOSIS — K5000 Crohn's disease of small intestine without complications: Secondary | ICD-10-CM | POA: Diagnosis not present

## 2021-05-01 DIAGNOSIS — K5090 Crohn's disease, unspecified, without complications: Secondary | ICD-10-CM | POA: Insufficient documentation

## 2021-05-01 MED ORDER — SODIUM CHLORIDE 0.9 % IV SOLN
300.0000 mg | Freq: Once | INTRAVENOUS | Status: AC
  Administered 2021-05-01: 300 mg via INTRAVENOUS
  Filled 2021-05-01: qty 30

## 2021-05-01 MED ORDER — DIPHENHYDRAMINE HCL 25 MG PO CAPS
50.0000 mg | ORAL_CAPSULE | Freq: Once | ORAL | Status: AC
  Administered 2021-05-01: 50 mg via ORAL
  Filled 2021-05-01: qty 2

## 2021-05-01 MED ORDER — ACETAMINOPHEN 325 MG PO TABS
650.0000 mg | ORAL_TABLET | Freq: Once | ORAL | Status: AC
  Administered 2021-05-01: 650 mg via ORAL
  Filled 2021-05-01: qty 2

## 2021-05-01 MED ORDER — SODIUM CHLORIDE 0.9 % IV SOLN
INTRAVENOUS | Status: DC | PRN
Start: 2021-05-01 — End: 2021-05-02
  Administered 2021-05-01: 250 mL via INTRAVENOUS

## 2021-05-01 NOTE — Progress Notes (Signed)
PATIENT CARE CENTER NOTE   Diagnosis: Crohn's disease     Provider: Silvano Rusk MD     Procedure: Remicade infusion 344m     Note: Patient received Remicade infusion via PIV. Patient premedicated with Tylenol and Benadryl as ordered. Infusion titrated per protocol. Patient tolerated well with no adverse reaction. Vital signs stable.Pt declined to stay for 1 hour observation and declined AVS. Patient to come back every 8 weeks for infusion, instructed to call center to schedule appointment since front desk staff out at discharge, provided with phone number to patient care center, pt verbalized understanding.  Alert, oriented and ambulatory at discharge.

## 2021-05-03 ENCOUNTER — Encounter: Payer: Self-pay | Admitting: Internal Medicine

## 2021-06-27 ENCOUNTER — Other Ambulatory Visit: Payer: Self-pay

## 2021-06-27 ENCOUNTER — Non-Acute Institutional Stay (HOSPITAL_COMMUNITY)
Admission: RE | Admit: 2021-06-27 | Discharge: 2021-06-27 | Disposition: A | Discharge status: Home / Self Care | Payer: Medicaid Other | Source: Ambulatory Visit | Attending: Internal Medicine | Admitting: Internal Medicine

## 2021-06-27 DIAGNOSIS — K5090 Crohn's disease, unspecified, without complications: Secondary | ICD-10-CM | POA: Insufficient documentation

## 2021-06-27 MED ORDER — ACETAMINOPHEN 325 MG PO TABS
650.0000 mg | ORAL_TABLET | Freq: Once | ORAL | Status: AC
  Administered 2021-06-27: 650 mg via ORAL
  Filled 2021-06-27: qty 2

## 2021-06-27 MED ORDER — DIPHENHYDRAMINE HCL 25 MG PO CAPS
50.0000 mg | ORAL_CAPSULE | Freq: Once | ORAL | Status: AC
  Administered 2021-06-27: 50 mg via ORAL
  Filled 2021-06-27: qty 2

## 2021-06-27 MED ORDER — SODIUM CHLORIDE 0.9 % IV SOLN
INTRAVENOUS | Status: DC | PRN
Start: 2021-06-27 — End: 2021-06-28

## 2021-06-27 MED ORDER — SODIUM CHLORIDE 0.9 % IV SOLN
5.0000 mg/kg | INTRAVENOUS | Status: DC
  Administered 2021-06-27: 300 mg via INTRAVENOUS
  Filled 2021-06-27: qty 30

## 2021-06-27 NOTE — Progress Notes (Signed)
PATIENT CARE CENTER NOTE    Diagnosis: Crohn's disease     Provider: Silvano Rusk MD     Procedure: Remicade infusion 334m     Note: Patient received Remicade infusion via PIV. Patient premedicated with Tylenol and Benadryl as ordered. Infusion titrated per protocol. Patient tolerated well with no adverse reaction. Vital signs stable.Pt declined to stay for 1 hour observation and declined AVS. Patient advised to stop at the front desk and make next infusion appointment for 8 weeks from now. Patient's current order will need to be renewed after next infusion.  Alert, oriented and ambulatory at discharge.

## 2021-08-22 ENCOUNTER — Non-Acute Institutional Stay (HOSPITAL_COMMUNITY)
Admission: RE | Admit: 2021-08-22 | Discharge: 2021-08-22 | Disposition: A | Discharge status: Home / Self Care | Payer: Medicaid Other | Source: Ambulatory Visit | Attending: Internal Medicine | Admitting: Internal Medicine

## 2021-08-22 ENCOUNTER — Other Ambulatory Visit: Payer: Self-pay

## 2021-08-22 DIAGNOSIS — K5000 Crohn's disease of small intestine without complications: Secondary | ICD-10-CM | POA: Diagnosis present

## 2021-08-22 MED ORDER — ACETAMINOPHEN 325 MG PO TABS
650.0000 mg | ORAL_TABLET | Freq: Once | ORAL | Status: AC
  Administered 2021-08-22: 08:00:00 650 mg via ORAL
  Filled 2021-08-22: qty 2

## 2021-08-22 MED ORDER — DIPHENHYDRAMINE HCL 25 MG PO CAPS
50.0000 mg | ORAL_CAPSULE | Freq: Once | ORAL | Status: AC
  Administered 2021-08-22: 08:00:00 50 mg via ORAL
  Filled 2021-08-22: qty 2

## 2021-08-22 MED ORDER — SODIUM CHLORIDE 0.9 % IV SOLN
300.0000 mg | INTRAVENOUS | Status: DC
  Administered 2021-08-22: 09:00:00 300 mg via INTRAVENOUS
  Filled 2021-08-22: qty 30

## 2021-08-22 MED ORDER — SODIUM CHLORIDE 0.9 % IV SOLN: INTRAVENOUS | Status: DC | PRN

## 2021-08-22 NOTE — Progress Notes (Signed)
PATIENT CARE CENTER NOTE     Diagnosis: Crohn's disease     Provider: Silvano Rusk MD     Procedure: Remicade infusion      Note: Patient received Remicade infusion via PIV. Patient premedicated with Tylenol and Benadryl per order. Infusion titrated per protocol. Patient tolerated well with no adverse reaction. Vital signs stable.Pt declined to stay for 1 hour observation and declined AVS. Patient advised to stop at the front desk and make next infusion appointment for 8 weeks from now. Patient's order will need to be renewed before next infusion.  Alert, oriented and ambulatory at discharge.

## 2021-10-14 ENCOUNTER — Telehealth: Payer: Self-pay | Admitting: Internal Medicine

## 2021-10-14 ENCOUNTER — Other Ambulatory Visit: Payer: Self-pay

## 2021-10-14 DIAGNOSIS — K50012 Crohn's disease of small intestine with intestinal obstruction: Secondary | ICD-10-CM

## 2021-10-14 MED ORDER — DIPHENHYDRAMINE HCL 25 MG PO CAPS: 50.0000 mg | ORAL_CAPSULE | Freq: Every day | ORAL | Status: AC

## 2021-10-14 MED ORDER — SODIUM CHLORIDE 0.9 % IV SOLN
5.0000 mg/kg | INTRAVENOUS | Status: AC
Start: 2021-10-14 — End: 2022-11-10

## 2021-10-14 MED ORDER — SODIUM CHLORIDE 0.9 % IV SOLN: INTRAVENOUS | Status: DC

## 2021-10-14 MED ORDER — ACETAMINOPHEN 325 MG PO TABS: 650.0000 mg | ORAL_TABLET | Freq: Every day | ORAL | Status: AC

## 2021-10-14 NOTE — Telephone Encounter (Signed)
Caryl Pina from the Patient Philip Joseph, [phone], called and stated patient needs a new Remicaid order as he is coming in later this week for his infusion.

## 2021-10-14 NOTE — Progress Notes (Signed)
error 

## 2021-10-15 NOTE — Telephone Encounter (Signed)
Orders were placed for the Remicade so pt can continue to receive Infusions:  Pt was scheduled for a Follow Up Visit on 11/13/2021 at 9:30 with Dr. Carlean Purl: Pt made aware: Pt verbalized understanding with all questions answered.

## 2021-10-16 ENCOUNTER — Other Ambulatory Visit: Payer: Self-pay

## 2021-10-16 DIAGNOSIS — K5000 Crohn's disease of small intestine without complications: Secondary | ICD-10-CM

## 2021-10-16 DIAGNOSIS — K50012 Crohn's disease of small intestine with intestinal obstruction: Secondary | ICD-10-CM

## 2021-10-17 ENCOUNTER — Non-Acute Institutional Stay (HOSPITAL_COMMUNITY)
Admission: RE | Admit: 2021-10-17 | Discharge: 2021-10-17 | Disposition: A | Discharge status: Home / Self Care | Payer: Medicaid Other | Source: Ambulatory Visit | Attending: Internal Medicine | Admitting: Internal Medicine

## 2021-10-17 ENCOUNTER — Other Ambulatory Visit: Payer: Self-pay

## 2021-10-17 DIAGNOSIS — K5000 Crohn's disease of small intestine without complications: Secondary | ICD-10-CM

## 2021-10-17 DIAGNOSIS — K50012 Crohn's disease of small intestine with intestinal obstruction: Secondary | ICD-10-CM | POA: Diagnosis present

## 2021-10-17 MED ORDER — SODIUM CHLORIDE 0.9 % IV SOLN
5.0000 mg/kg | INTRAVENOUS | Status: DC
  Administered 2021-10-17: 300 mg via INTRAVENOUS
  Filled 2021-10-17: qty 30

## 2021-10-17 MED ORDER — ACETAMINOPHEN 325 MG PO TABS
650.0000 mg | ORAL_TABLET | Freq: Once | ORAL | Status: AC
  Administered 2021-10-17: 650 mg via ORAL
  Filled 2021-10-17: qty 2

## 2021-10-17 MED ORDER — DIPHENHYDRAMINE HCL 25 MG PO CAPS
50.0000 mg | ORAL_CAPSULE | Freq: Once | ORAL | Status: AC
  Administered 2021-10-17: 50 mg via ORAL
  Filled 2021-10-17: qty 2

## 2021-10-17 MED ORDER — SODIUM CHLORIDE 0.9 % IV SOLN: INTRAVENOUS | Status: DC

## 2021-10-17 NOTE — Progress Notes (Signed)
PATIENT CARE CENTER NOTE     Diagnosis: Crohn's disease of ileum with intestinal obstruction ([place]) (U31.497)     Provider: Silvano Rusk MD     Procedure: Remicade infusion      Note: Patient received Remicade infusion via PIV. Patient premedicated with Tylenol and Benadryl per order. Infusion titrated per protocol. Patient tolerated well with no adverse reaction. Vital signs stable.Pt declined to stay for 1 hour observation and declined AVS. Patient advised to stop at the front desk and make next infusion appointment for 8 weeks from now. Patient's order was for 1 x dose and will need to be renewed before next infusion.  Alert, oriented and ambulatory at discharge.

## 2021-11-13 ENCOUNTER — Other Ambulatory Visit (INDEPENDENT_AMBULATORY_CARE_PROVIDER_SITE_OTHER): Payer: Medicaid Other

## 2021-11-13 ENCOUNTER — Other Ambulatory Visit: Payer: Self-pay | Admitting: Internal Medicine

## 2021-11-13 ENCOUNTER — Encounter: Payer: Self-pay | Admitting: Internal Medicine

## 2021-11-13 ENCOUNTER — Ambulatory Visit (INDEPENDENT_AMBULATORY_CARE_PROVIDER_SITE_OTHER): Payer: Medicaid Other | Admitting: Internal Medicine

## 2021-11-13 VITALS — BP 90/60 | HR 77 | Ht 69.0 in | Wt 132.0 lb

## 2021-11-13 DIAGNOSIS — Z111 Encounter for screening for respiratory tuberculosis: Secondary | ICD-10-CM | POA: Diagnosis not present

## 2021-11-13 DIAGNOSIS — E559 Vitamin D deficiency, unspecified: Secondary | ICD-10-CM

## 2021-11-13 DIAGNOSIS — K50012 Crohn's disease of small intestine with intestinal obstruction: Secondary | ICD-10-CM

## 2021-11-13 DIAGNOSIS — Z7960 Long term (current) use of unspecified immunomodulators and immunosuppressants: Secondary | ICD-10-CM

## 2021-11-13 LAB — CBC WITH DIFFERENTIAL/PLATELET
Basophils Absolute: 0 10*3/uL (ref 0.0–0.1)
Basophils Relative: 0.4 % (ref 0.0–3.0)
Eosinophils Absolute: 0.1 10*3/uL (ref 0.0–0.7)
Eosinophils Relative: 1.3 % (ref 0.0–5.0)
HCT: 45.1 % (ref 39.0–52.0)
Hemoglobin: 15.7 g/dL (ref 13.0–17.0)
Lymphocytes Relative: 44.4 % (ref 12.0–46.0)
Lymphs Abs: 2.2 10*3/uL (ref 0.7–4.0)
MCHC: 34.7 g/dL (ref 30.0–36.0)
MCV: 92.3 fl (ref 78.0–100.0)
Monocytes Absolute: 0.3 10*3/uL (ref 0.1–1.0)
Monocytes Relative: 5.8 % (ref 3.0–12.0)
Neutro Abs: 2.4 10*3/uL (ref 1.4–7.7)
Neutrophils Relative %: 48.1 % (ref 43.0–77.0)
Platelets: 172 10*3/uL (ref 150.0–400.0)
RBC: 4.89 Mil/uL (ref 4.22–5.81)
RDW: 12.7 % (ref 11.5–15.5)
WBC: 5 10*3/uL (ref 4.0–10.5)

## 2021-11-13 LAB — COMPREHENSIVE METABOLIC PANEL
ALT: 6 U/L (ref 0–53)
AST: 12 U/L (ref 0–37)
Albumin: 4.9 g/dL (ref 3.5–5.2)
Alkaline Phosphatase: 72 U/L (ref 39–117)
BUN: 10 mg/dL (ref 6–23)
CO2: 27 mEq/L (ref 19–32)
Calcium: 9.7 mg/dL (ref 8.4–10.5)
Chloride: 105 mEq/L (ref 96–112)
Creatinine, Ser: 1.01 mg/dL (ref 0.40–1.50)
GFR: 100.37 mL/min (ref >60.00)
Glucose, Bld: 91 mg/dL (ref 70–99)
Potassium: 4.4 mEq/L (ref 3.5–5.1)
Sodium: 141 mEq/L (ref 135–145)
Total Bilirubin: 0.8 mg/dL (ref 0.2–1.2)
Total Protein: 7.5 g/dL (ref 6.0–8.3)

## 2021-11-13 LAB — FERRITIN: Ferritin: 60.5 ng/mL (ref 22.0–322.0)

## 2021-11-13 LAB — VITAMIN D 25 HYDROXY (VIT D DEFICIENCY, FRACTURES): VITD: 33.04 ng/mL (ref 30.00–100.00)

## 2021-11-13 LAB — B12 AND FOLATE PANEL
Folate: 9.9 ng/mL (ref >5.9)
Vitamin B-12: 240 pg/mL (ref 211–911)

## 2021-11-13 MED ORDER — VITAMIN D 25 MCG (1000 UNIT) PO TABS: 1000.0000 [IU] | ORAL_TABLET | Freq: Every day | ORAL | 3 refills | Status: AC

## 2021-11-13 MED ORDER — VITAMIN D (ERGOCALCIFEROL) 1.25 MG (50000 UNIT) PO CAPS: 50000.0000 [IU] | ORAL_CAPSULE | ORAL | 0 refills | Status: DC

## 2021-11-13 NOTE — Patient Instructions (Signed)
Your provider has requested that you go to the basement level for lab work before leaving today. Press "B" on the elevator. The lab is located at the first door on the left as you exit the elevator. ? ?We have sent the following medications to your pharmacy for you to pick up at your convenience: ?Vitamin D ? ?I appreciate the opportunity to care for you. ?Silvano Rusk, MD, [facility] ?

## 2021-11-13 NOTE — Progress Notes (Signed)
? ?[name] 31 y.o. May 15, 1991 [id number] ? ?Assessment & Plan:  ? ?Encounter Diagnoses  ?Name Primary?  ? Crohn's disease of ileum with intestinal obstruction ([place]) Yes  ? Vitamin D deficiency   ? Long-term use of immunosuppressant medication- Remicade   ? Tuberculosis screening   ? ? ?He continues to be clinically well.  He remains thin with a stable weight.  Vitamin D deficiency is still an issue based upon lab reviews from last fall at PCP.  We discussed repeat imaging he is not inclined to do so he feels well I am not sure it would add anything and I am not convinced colonoscopy would either.  We will see what the labs show.  Further plans pending that anticipate at least a routine annual follow-up sooner if needed. ? ?Orders Placed This Encounter  ?Procedures  ? B12 and Folate Panel  ? CBC w/Diff  ? Ferritin  ? Comp Met (CMET)  ? Vitamin D (25 hydroxy)  ? QuantiFERON-TB Gold Plus  ? ?Meds ordered this encounter  ?Medications  ? Vitamin D, Ergocalciferol, (DRISDOL) 1.25 MG (50000 UNIT) CAPS capsule  ?  Sig: Take 1 capsule (50,000 Units total) by mouth every 7 (seven) days.  ?  Dispense:  8 capsule  ?  Refill:  0  ? cholecalciferol (VITAMIN D3) 25 MCG (1000 UNIT) tablet  ?  Sig: Take 1 tablet (1,000 Units total) by mouth daily.  ?  Dispense:  90 tablet  ?  Refill:  3  ? ? ? ? ?Subjective:  ? ?Chief Complaint: Follow-up of Crohn's ileitis ? ?HPI ?Patient is a 31 year old African-American man who was diagnosed with Crohn's ileitis in 2015.  He has been on Remicade for many years with a lapse last year and we restarted it.  He reports normal formed bowel movements without bleeding.  He asks me if he was to continue taking vitamin D.  Last year I prescribed a course of vitamin D supplementation 50,000 units weekly and 1000 units daily.  It does not seem like he is on either 1 of these at this point.  He denies any perianal or perirectal symptoms. ?MR enterography last year off therapy  01/16/2021 ?IMPRESSION: ?1. Similar appearance of short segment of terminal ileum with mild ?wall thickening and mucosal enhancement compatible with residual ?terminal ileitis secondary to Crohn's disease. No signs of bowel ?obstruction, penetrating disease or abscess. ?2. Large stool burden throughout the colon up to the level of the ?rectum. ? ?Wt Readings from Last 3 Encounters:  ?11/13/21 132 lb (59.9 kg)  ?10/17/21 128 lb (58.1 kg)  ?08/22/21 131 lb (59.4 kg)  ? ? ? ?Allergies  ?Allergen Reactions  ? Penicillins Rash  ?  Has patient had a PCN reaction causing immediate rash, facial/tongue/throat swelling, SOB or lightheadedness with hypotension: No ?Has patient had a PCN reaction causing severe rash involving mucus membranes or skin necrosis: No ?Has patient had a PCN reaction that required hospitalization No ?Has patient had a PCN reaction occurring within the last 10 years: No ?If all of the above answers are "NO", then may proceed with Cephalosporin use. ?  ? ?Current Meds  ?Medication Sig  ? cholecalciferol (VITAMIN D3) 25 MCG (1000 UNIT) tablet Take 1 tablet (1,000 Units total) by mouth daily.  ? dicyclomine (BENTYL) 20 MG tablet Take 1 tablet (20 mg total) by mouth 4 (four) times daily as needed for spasms.  ? inFLIXimab (REMICADE) 100 MG injection Remicade 5 mg/kg IV on week  0,2,6 then q 8 weeks  ? ondansetron (ZOFRAN ODT) 4 MG disintegrating tablet Take 1 tablet (4 mg total) by mouth every 8 (eight) hours as needed for nausea or vomiting.  ?    ?    ?    ? ?Current Facility-Administered Medications for the 11/13/21 encounter (Office Visit) with Philip Mayer, MD  ?Medication  ? acetaminophen (TYLENOL) tablet 650 mg  ? diphenhydrAMINE (BENADRYL) capsule 50 mg  ? inFLIXimab (REMICADE) 5 mg/kg = 300 mg in sodium chloride 0.9 % 250 mL infusion  ? ?Past Medical History:  ?Diagnosis Date  ? ADHD (attention deficit hyperactivity disorder)   ? Crohn's ileitis ([place]) 12/19/2013  ? Long term current use of  systemic steroids 11/27/2015  ? 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 Unspecified vitamin D deficiency 12/21/2013  ? ?Past Surgical History:  ?Procedure Laterality Date  ? COLONOSCOPY N/A 12/20/2013  ? Procedure: COLONOSCOPY;  Surgeon: Philip Mayer, MD;  [facility]: [facility];  Service: Endoscopy;  Laterality: N/A;  ? COLONOSCOPY    ? WRIST FRACTURE SURGERY    ? ?Social History  ? ?Social History Narrative  ? ** Merged History Encounter **  ?    ? Single, 1 son born 2018. ?Lives w/ mom ?+ Smoker of tobacco and marijuana ?Disability ?  ? ?family history includes Prostate cancer in his father. ? ? ?Review of Systems ?As per HPI ? ?Objective:  ? Physical Exam ?@BP  90/60   Pulse 77   Ht 5' 9"  (1.753 m)   Wt 132 lb (59.9 kg)   BMI 19.49 kg/m? @ ? ?General:  NAD ?Eyes:   anicteric ?Lungs:  clear ?Heart::  S1S2 no rubs, murmurs or gallops ?Abdomen:  soft and nontender, BS+ ?Ext:   no edema, cyanosis or clubbing ? ? ? ?Data Reviewed:  ? ?See HPI ?

## 2021-11-16 LAB — QUANTIFERON-TB GOLD PLUS
Mitogen-NIL: >10 IU/mL
NIL: 0.1 IU/mL
QuantiFERON-TB Gold Plus: NEGATIVE
TB1-NIL: 0.02 IU/mL
TB2-NIL: 0.02 IU/mL

## 2021-11-18 ENCOUNTER — Other Ambulatory Visit: Payer: Self-pay

## 2021-12-12 ENCOUNTER — Non-Acute Institutional Stay (HOSPITAL_COMMUNITY)
Admission: RE | Admit: 2021-12-12 | Discharge: 2021-12-12 | Disposition: A | Discharge status: Home / Self Care | Payer: Medicaid Other | Source: Ambulatory Visit | Attending: Internal Medicine | Admitting: Internal Medicine

## 2021-12-12 DIAGNOSIS — K50012 Crohn's disease of small intestine with intestinal obstruction: Secondary | ICD-10-CM | POA: Diagnosis present

## 2021-12-12 MED ORDER — ACETAMINOPHEN 325 MG PO TABS
650.0000 mg | ORAL_TABLET | Freq: Once | ORAL | Status: AC
  Administered 2021-12-12: 650 mg via ORAL
  Filled 2021-12-12: qty 2

## 2021-12-12 MED ORDER — DIPHENHYDRAMINE HCL 25 MG PO CAPS
50.0000 mg | ORAL_CAPSULE | Freq: Once | ORAL | Status: AC
  Administered 2021-12-12: 50 mg via ORAL
  Filled 2021-12-12: qty 2

## 2021-12-12 MED ORDER — SODIUM CHLORIDE 0.9 % IV SOLN: INTRAVENOUS | Status: DC | PRN

## 2021-12-12 MED ORDER — SODIUM CHLORIDE 0.9 % IV SOLN
300.0000 mg | INTRAVENOUS | Status: DC
  Administered 2021-12-12: 300 mg via INTRAVENOUS
  Filled 2021-12-12: qty 30

## 2021-12-12 NOTE — Progress Notes (Signed)
PATIENT CARE CENTER NOTE ?  ?  ?Diagnosis: Crohn's disease of ileum with intestinal obstruction ([place]) (K50.012) ?  ?  ?Provider: Silvano Rusk MD ?  ?  ?Procedure: Remicade infusion  ?  ?  ?Note: Patient received Remicade infusion (dose 1 of 7) via PIV. Patient pre-medicated with Tylenol and Benadryl per order. Infusion titrated per protocol. Patient tolerated well with no adverse reaction. Vital signs stable.Pt declined to stay for 1 hour observation post infusion and declined AVS. Patient advised to stop at the front desk and make next infusion appointment for 8 weeks from now.  Alert, oriented and ambulatory at discharge. ?

## 2022-02-06 ENCOUNTER — Non-Acute Institutional Stay (HOSPITAL_COMMUNITY)
Admission: RE | Admit: 2022-02-06 | Discharge: 2022-02-06 | Disposition: A | Discharge status: Home / Self Care | Payer: Medicaid Other | Source: Ambulatory Visit | Attending: Internal Medicine | Admitting: Internal Medicine

## 2022-02-06 DIAGNOSIS — K50012 Crohn's disease of small intestine with intestinal obstruction: Secondary | ICD-10-CM | POA: Diagnosis present

## 2022-02-06 MED ORDER — DIPHENHYDRAMINE HCL 25 MG PO CAPS
50.0000 mg | ORAL_CAPSULE | Freq: Once | ORAL | Status: AC
Start: 2022-02-06 — End: 2022-02-06
  Administered 2022-02-06: 50 mg via ORAL
  Filled 2022-02-06: qty 2

## 2022-02-06 MED ORDER — ACETAMINOPHEN 325 MG PO TABS
650.0000 mg | ORAL_TABLET | Freq: Once | ORAL | Status: AC
  Administered 2022-02-06: 650 mg via ORAL
  Filled 2022-02-06: qty 2

## 2022-02-06 MED ORDER — SODIUM CHLORIDE 0.9 % IV SOLN: INTRAVENOUS | Status: DC | PRN

## 2022-02-06 MED ORDER — SODIUM CHLORIDE 0.9 % IV SOLN
5.0000 mg/kg | INTRAVENOUS | Status: DC
  Administered 2022-02-06: 300 mg via INTRAVENOUS
  Filled 2022-02-06: qty 30

## 2022-02-06 NOTE — Progress Notes (Signed)
PATIENT CARE CENTER NOTE     Diagnosis: Crohn's disease of ileum with intestinal obstruction ([place]) (F12.527)     Provider: Silvano Rusk MD     Procedure: Remicade infusion      Note: Patient received Remicade infusion (dose 2 of 7) via PIV. Patient pre-medicated with Tylenol and Benadryl per order. Infusion titrated per protocol. Patient tolerated well with no adverse reaction. Vital signs stable.Pt declined to stay for 1 hour observation post infusion and declined AVS. Patient advised to stop at the front desk and make next infusion appointment for 8 weeks from now.  Alert, oriented and ambulatory at discharge.

## 2022-04-03 ENCOUNTER — Encounter (HOSPITAL_COMMUNITY): Payer: Medicaid Other

## 2022-04-08 ENCOUNTER — Non-Acute Institutional Stay (HOSPITAL_COMMUNITY)
Admission: RE | Admit: 2022-04-08 | Discharge: 2022-04-08 | Disposition: A | Discharge status: Home / Self Care | Payer: Medicaid Other | Source: Ambulatory Visit | Attending: Internal Medicine | Admitting: Internal Medicine

## 2022-04-08 DIAGNOSIS — K50012 Crohn's disease of small intestine with intestinal obstruction: Secondary | ICD-10-CM | POA: Diagnosis present

## 2022-04-08 MED ORDER — ACETAMINOPHEN 325 MG PO TABS
650.0000 mg | ORAL_TABLET | Freq: Once | ORAL | Status: AC
  Administered 2022-04-08: 650 mg via ORAL
  Filled 2022-04-08: qty 2

## 2022-04-08 MED ORDER — DIPHENHYDRAMINE HCL 25 MG PO CAPS
50.0000 mg | ORAL_CAPSULE | Freq: Once | ORAL | Status: AC
  Administered 2022-04-08: 50 mg via ORAL
  Filled 2022-04-08: qty 2

## 2022-04-08 MED ORDER — SODIUM CHLORIDE 0.9 % IV SOLN
300.0000 mg | INTRAVENOUS | Status: DC
  Administered 2022-04-08: 300 mg via INTRAVENOUS
  Filled 2022-04-08: qty 30

## 2022-04-08 MED ORDER — SODIUM CHLORIDE 0.9 % IV SOLN
INTRAVENOUS | Status: DC | PRN
Start: 2022-04-08 — End: 2022-04-09

## 2022-04-08 NOTE — Progress Notes (Signed)
PATIENT CARE CENTER NOTE     Diagnosis: Crohn's disease of ileum with intestinal obstruction ([place]) (M14.709)     Provider: Silvano Rusk MD     Procedure: Remicade infusion      Note: Patient received Remicade infusion (dose 3 of 7) via PIV. Patient pre-medicated with Tylenol and Benadryl per order. Infusion titrated per protocol. Patient tolerated well with no adverse reaction. Vital signs stable.Pt declined to stay for 1 hour observation post infusion and declined AVS. Patient advised to stop at the front desk and make next infusion appointment for 8 weeks from now.  Alert, oriented and ambulatory at discharge.

## 2022-04-19 ENCOUNTER — Emergency Department (HOSPITAL_COMMUNITY): Payer: Medicaid Other

## 2022-04-19 ENCOUNTER — Emergency Department (HOSPITAL_COMMUNITY)
Admission: EM | Admit: 2022-04-19 | Discharge: 2022-04-20 | Disposition: A | Discharge status: Home / Self Care | Payer: Medicaid Other | Attending: Emergency Medicine | Admitting: Emergency Medicine

## 2022-04-19 ENCOUNTER — Other Ambulatory Visit: Payer: Self-pay

## 2022-04-19 ENCOUNTER — Encounter (HOSPITAL_COMMUNITY): Payer: Self-pay | Admitting: *Deleted

## 2022-04-19 DIAGNOSIS — R0789 Other chest pain: Secondary | ICD-10-CM | POA: Diagnosis present

## 2022-04-19 DIAGNOSIS — R002 Palpitations: Secondary | ICD-10-CM | POA: Insufficient documentation

## 2022-04-19 NOTE — ED Triage Notes (Addendum)
Left upper chest pain for "months" and worse today. "Sharp".Also states that his heart feels like it is beating fast at times.

## 2022-04-19 NOTE — ED Triage Notes (Signed)
Patient w/ L sided anterior chest pain and body aches for 4 weeks. VSS   BP-148/82  Hr-92 Spo2- 100%  CBG 114

## 2022-04-19 NOTE — ED Provider Triage Note (Signed)
Emergency Medicine Provider Triage Evaluation Note  Philip Joseph , a 31 y.o. male  was evaluated in triage.  Pt complains of chest pain. States he has been having intermittent chest pain for months. He states it is sometimes sharp and can be central or left sided. He will randomly have palpitations or shortness of breath with the chest pain. No cough or fever or syncope  Review of Systems  Positive: See above Negative:   Physical Exam  BP 125/74   Pulse 77   Temp 98.5 F (36.9 C) (Oral)   Resp 18   SpO2 100%  Gen:   Awake, no distress   Resp:  Normal effort  MSK:   Moves extremities without difficulty  Other:  S1/S2 without murmur, lungs CTAB  Medical Decision Making  Medically screening exam initiated at 7:38 PM.  Appropriate orders placed.  Epimenio Foot was informed that the remainder of the evaluation will be completed by another provider, this initial triage assessment does not replace that evaluation, and the importance of remaining in the ED until their evaluation is complete.     Mickie Hillier, PA-C 04/19/22 1939

## 2022-04-20 LAB — BASIC METABOLIC PANEL
Anion gap: 8 (ref 5–15)
BUN: 8 mg/dL (ref 6–20)
CO2: 25 mmol/L (ref 22–32)
Calcium: 9 mg/dL (ref 8.9–10.3)
Chloride: 105 mmol/L (ref 98–111)
Creatinine, Ser: 0.95 mg/dL (ref 0.61–1.24)
GFR, Estimated: >60 mL/min (ref >60)
Glucose, Bld: 95 mg/dL (ref 70–99)
Potassium: 3.6 mmol/L (ref 3.5–5.1)
Sodium: 138 mmol/L (ref 135–145)

## 2022-04-20 LAB — TROPONIN I (HIGH SENSITIVITY): Troponin I (High Sensitivity): 4 ng/L (ref <18)

## 2022-04-20 LAB — CBC
HCT: 40.7 % (ref 39.0–52.0)
Hemoglobin: 14.7 g/dL (ref 13.0–17.0)
MCH: 32.6 pg (ref 26.0–34.0)
MCHC: 36.1 g/dL — ABNORMAL HIGH (ref 30.0–36.0)
MCV: 90.2 fL (ref 80.0–100.0)
Platelets: 164 10*3/uL (ref 150–400)
RBC: 4.51 MIL/uL (ref 4.22–5.81)
RDW: 11.8 % (ref 11.5–15.5)
WBC: 6 10*3/uL (ref 4.0–10.5)
nRBC: 0 % (ref 0.0–0.2)

## 2022-04-20 MED ORDER — IBUPROFEN 400 MG PO TABS
400.0000 mg | ORAL_TABLET | Freq: Once | ORAL | Status: AC | PRN
  Administered 2022-04-20: 400 mg via ORAL
  Filled 2022-04-20: qty 1

## 2022-04-20 NOTE — Discharge Instructions (Signed)
Your work-up looks good today.  Your EKG is normal and your chest x-ray is also normal.  The lab work that we obtained is without abnormalities.  Look out for a phone call within the next 3 days from a cardiology office for you to see if you continue to have these episodes.  Return with any worsening chest pain, bothersome palpitations or any other worsening symptoms.  It was a pleasure to meet you and I hope that you feel better!

## 2022-04-20 NOTE — ED Notes (Signed)
Provided pt snacks and ginger ale. RN updated pt that blood work is the only thing EDP is waiting on.

## 2022-04-20 NOTE — ED Notes (Signed)
Pt eating breakfast at the bedside. Pt updated on blood work pending

## 2022-04-20 NOTE — ED Notes (Signed)
Pt received breakfast tray 

## 2022-04-20 NOTE — ED Provider Notes (Signed)
[facility] EMERGENCY DEPARTMENT Provider Note   CSN: [id number] Arrival date & time: 04/19/22  1842     History  Chief Complaint  Patient presents with   Chest Pain    Philip Joseph is a 31 y.o. male with a past medical history of Crohn's colitis presenting today with chest pain.  Reports that for "a while" he has had intermittent chest discomfort.  It can be relieved by massaging the chest wall.  No associated shortness of breath but does endorse palpitations.  This does not seem to be brought on by anything in particular.  Has never seen a cardiologist.  Crohn's colitis is well controlled.  Currently asymptomatic   Chest Pain Associated symptoms: palpitations   Associated symptoms: no fever and no shortness of breath        Home Medications Prior to Admission medications   Medication Sig Start Date End Date Taking? Authorizing Provider  cholecalciferol (VITAMIN D3) 25 MCG (1000 UNIT) tablet Take 1 tablet (1,000 Units total) by mouth daily. 11/13/21   [name], MD  dicyclomine (BENTYL) 20 MG tablet Take 1 tablet (20 mg total) by mouth 4 (four) times daily as needed for spasms. 03/12/21   [name], MD  inFLIXimab (REMICADE) 100 MG injection Remicade 5 mg/kg IV on week 0,2,6 then q 8 weeks 01/18/21   [name], MD  ondansetron (ZOFRAN ODT) 4 MG disintegrating tablet Take 1 tablet (4 mg total) by mouth every 8 (eight) hours as needed for nausea or vomiting. 05/27/20   Lorin Glass, PA-C  Vitamin D, Ergocalciferol, (DRISDOL) 1.25 MG (50000 UNIT) CAPS capsule Take 1 capsule (50,000 Units total) by mouth every 7 (seven) days. 11/13/21   [name], MD      Allergies    Penicillins    Review of Systems   Review of Systems  Constitutional:  Negative for chills and fever.  Respiratory:  Negative for shortness of breath.   Cardiovascular:  Positive for chest pain and palpitations.    Physical Exam Updated Vital Signs BP  122/88 (BP Location: Left Arm)   Pulse (!) 57   Temp 99.1 F (37.3 C) (Oral)   Resp 18   SpO2 100%  Physical Exam Vitals and nursing note reviewed.  Constitutional:      Appearance: Normal appearance.  HENT:     Head: Normocephalic and atraumatic.  Eyes:     General: No scleral icterus.    Conjunctiva/sclera: Conjunctivae normal.  Cardiovascular:     Rate and Rhythm: Normal rate and regular rhythm.     Heart sounds: Normal heart sounds.  Pulmonary:     Effort: Pulmonary effort is normal. No respiratory distress.     Breath sounds: Normal breath sounds.  Skin:    General: Skin is warm and dry.     Findings: No rash.  Neurological:     Mental Status: He is alert.  Psychiatric:        Mood and Affect: Mood normal.     ED Results / Procedures / Treatments   Labs (all labs ordered are listed, but only abnormal results are displayed) Labs Reviewed  CBC - Abnormal; Notable for the following components:      Result Value   MCHC 36.1 (*)    All other components within normal limits  BASIC METABOLIC PANEL  TROPONIN I (HIGH SENSITIVITY)    EKG EKG Interpretation  Date/Time:  Saturday April 19 2022 19:31:14 EDT Ventricular Rate:  82 PR Interval:  144 QRS Duration: 94 QT Interval:  366 QTC Calculation: 427 R Axis:   63 Text Interpretation: Normal sinus rhythm Normal ECG When compared with ECG of 19-Apr-2022 19:30, PREVIOUS ECG IS PRESENT Confirmed by Dene Gentry [phone]) on 04/20/2022 7:54:53 AM  Radiology DG Chest 2 View  Result Date: 04/19/2022 CLINICAL DATA:  Chest pain EXAM: CHEST - 2 VIEW COMPARISON:  Chest x-ray dated July 24, 2016 FINDINGS: The heart size and mediastinal contours are within normal limits. Both lungs are clear. The visualized skeletal structures are unremarkable. IMPRESSION: No active cardiopulmonary disease. Electronically Signed   By: Yetta Glassman M.D.   On: 04/19/2022 20:13    Procedures Procedures   Medications Ordered in  ED Medications  ibuprofen (ADVIL) tablet 400 mg (400 mg Oral Given 04/20/22 0600)    ED Course/ Medical Decision Making/ A&P Clinical Course as of 04/20/22 0802  Sun Apr 20, 2022  0753 1. Upon my arrival today, patient's labs were absent.  I spoke with lab technician who reported that there was a problem with his registration so she will run them now.  [MR]  [phone] RN notified me that lab requested redraw. [MR]    Clinical Course User Index [MR] Abygail Galeno, Cecilio Asper, PA-C                           Medical Decision Making Amount and/or Complexity of Data Reviewed Labs: ordered.  Risk Prescription drug management.   This patient presents to the ED for concern of chest pains and palpitations over the past few months.  Differential includes but is not limited to arrhythmia, PVCs/PACs, ACS, angina, GERD, PUD and PE.   This is not an exhaustive differential.    Past Medical History / Co-morbidities / Social History: Crohn's   Additional history: Patient has never been treated for chest pain in the past, only multiple visits for Crohn's colitis   Physical Exam: Pertinent physical exam findings include RRR, lung sounds CTA B  Lab Tests: I ordered, and personally interpreted labs.  The pertinent results include: Negative troponin x1 Normal other labs   Imaging Studies: I ordered and independently visualized and interpreted CXR which showed no abnormalities. I agree with the radiologist interpretation.   Medications: Advil given in triage 1-1/2 hours ago    Disposition: This is a 31 year old male who presented today with intermittent chest pains over the past.  Did not seem to be provoked by anything.  Occasionally has palpitations as well.  No shortness of breath.  Has never seen a cardiologist.  No history of hypertension, hyperlipidemia or ACS.  Heart score 0.  Lab work negative.  Only 1 troponin was performed, low heart score and patient is requesting discharge after being in  the department for 14 hours.  He is asymptomatic.  I suspect this is more musculoskeletal as it is relieved with massaging.  Potentially costochondritis.  He will use ibuprofen and I will give him a cardiology follow-up as needed.  He is agreeable to the plan.  No emergent or urgent condition requiring admission or for patient to sign AMA paperwork to wait for second troponin   Final Clinical Impression(s) / ED Diagnoses Final diagnoses:  Chest wall pain    Rx / DC Orders ED Discharge Orders          Ordered    Ambulatory referral to Cardiology       Comments: If you have not  heard from the Cardiology office within the next 72 hours please call [phone].   04/20/22 0757           Results and diagnoses were explained to the patient. Return precautions discussed in full. Patient had no additional questions and expressed complete understanding.   This chart was dictated using voice recognition software.  Despite best efforts to proofread,  errors can occur which can change the documentation meaning.     Darliss Ridgel 04/20/22 8550    Valarie Merino, MD 04/21/22 [phone]

## 2022-04-27 ENCOUNTER — Emergency Department (HOSPITAL_COMMUNITY): Payer: Medicaid Other

## 2022-04-27 ENCOUNTER — Emergency Department (HOSPITAL_COMMUNITY)
Admission: EM | Admit: 2022-04-27 | Discharge: 2022-04-28 | Disposition: A | Discharge status: Home / Self Care | Payer: Medicaid Other | Attending: Emergency Medicine | Admitting: Emergency Medicine

## 2022-04-27 ENCOUNTER — Other Ambulatory Visit: Payer: Self-pay

## 2022-04-27 ENCOUNTER — Encounter (HOSPITAL_COMMUNITY): Payer: Self-pay | Admitting: Emergency Medicine

## 2022-04-27 DIAGNOSIS — F17210 Nicotine dependence, cigarettes, uncomplicated: Secondary | ICD-10-CM | POA: Insufficient documentation

## 2022-04-27 DIAGNOSIS — R079 Chest pain, unspecified: Secondary | ICD-10-CM | POA: Diagnosis not present

## 2022-04-27 LAB — CBC WITH DIFFERENTIAL/PLATELET
Abs Immature Granulocytes: 0.02 10*3/uL (ref 0.00–0.07)
Basophils Absolute: 0 10*3/uL (ref 0.0–0.1)
Basophils Relative: 0 %
Eosinophils Absolute: 0.1 10*3/uL (ref 0.0–0.5)
Eosinophils Relative: 1 %
HCT: 43.8 % (ref 39.0–52.0)
Hemoglobin: 15.7 g/dL (ref 13.0–17.0)
Immature Granulocytes: 0 %
Lymphocytes Relative: 54 %
Lymphs Abs: 5.2 10*3/uL — ABNORMAL HIGH (ref 0.7–4.0)
MCH: 32.2 pg (ref 26.0–34.0)
MCHC: 35.8 g/dL (ref 30.0–36.0)
MCV: 89.9 fL (ref 80.0–100.0)
Monocytes Absolute: 0.6 10*3/uL (ref 0.1–1.0)
Monocytes Relative: 6 %
Neutro Abs: 3.9 10*3/uL (ref 1.7–7.7)
Neutrophils Relative %: 39 %
Platelets: 191 10*3/uL (ref 150–400)
RBC: 4.87 MIL/uL (ref 4.22–5.81)
RDW: 11.8 % (ref 11.5–15.5)
WBC: 9.9 10*3/uL (ref 4.0–10.5)
nRBC: 0 % (ref 0.0–0.2)

## 2022-04-27 NOTE — ED Provider Triage Note (Signed)
Emergency Medicine Provider Triage Evaluation Note  Philip Joseph , a 31 y.o. male  was evaluated in triage.  Pt complains of chest pain for "a while".  Has back pain and SOB now too, feels like his back is "locking up".  No cardiac hx.  Review of Systems  Positive: Chest pain Negative: Fever, cough  Physical Exam  BP (!) 134/95 (BP Location: Right Arm)   Pulse 93   Temp 98.2 F (36.8 C) (Oral)   Resp 18   SpO2 100%  Gen:   Awake, no distress   Resp:  Normal effort  MSK:   Moves extremities without difficulty  Other:    Medical Decision Making  Medically screening exam initiated at 11:35 PM.  Appropriate orders placed.  Epimenio Foot was informed that the remainder of the evaluation will be completed by another provider, this initial triage assessment does not replace that evaluation, and the importance of remaining in the ED until their evaluation is complete.  Chest pain.  EKG, labs, CXR.   Larene Pickett, PA-C 04/27/22 2337

## 2022-04-27 NOTE — ED Triage Notes (Signed)
Pt c/o chest pain, shortness of breath, and now back pain. Seen previously for similar chest pain.

## 2022-04-28 LAB — BASIC METABOLIC PANEL
Anion gap: 12 (ref 5–15)
BUN: 10 mg/dL (ref 6–20)
CO2: 25 mmol/L (ref 22–32)
Calcium: 9.5 mg/dL (ref 8.9–10.3)
Chloride: 103 mmol/L (ref 98–111)
Creatinine, Ser: 1.01 mg/dL (ref 0.61–1.24)
GFR, Estimated: >60 mL/min (ref >60)
Glucose, Bld: 112 mg/dL — ABNORMAL HIGH (ref 70–99)
Potassium: 3.6 mmol/L (ref 3.5–5.1)
Sodium: 140 mmol/L (ref 135–145)

## 2022-04-28 LAB — TROPONIN I (HIGH SENSITIVITY)
Troponin I (High Sensitivity): 3 ng/L (ref <18)
Troponin I (High Sensitivity): 4 ng/L (ref <18)

## 2022-04-28 MED ORDER — NAPROXEN 500 MG PO TABS: 500.0000 mg | ORAL_TABLET | Freq: Two times a day (BID) | ORAL | 0 refills | Status: DC

## 2022-04-28 NOTE — ED Notes (Signed)
Patient verbalizes understanding of discharge instructions. Opportunity for questioning and answers were provided. Armband removed by staff, pt discharged from ED ambulatory.   

## 2022-04-28 NOTE — ED Provider Notes (Signed)
[facility] Emergency Department Provider Note MRN:  [id number]  Arrival date & time: 04/28/22     Chief Complaint   Chest Pain   History of Present Illness   Philip Joseph is a 31 y.o. year-old male with a history of Crohn's presenting to the ED with chief complaint of chest pain.  Left-sided chest pain with radiation along the left ribs into the left back.  Worse with certain movements or positions.  Has been sleeping on a cot recently and thinks maybe it is related to that.  No shortness of breath, no fever or cough, no leg pain or swelling.  Review of Systems  A thorough review of systems was obtained and all systems are negative except as noted in the HPI and PMH.   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   Unspecified vitamin D deficiency 12/21/2013    Past Surgical History:  Procedure Laterality Date   COLONOSCOPY N/A 12/20/2013   Procedure: COLONOSCOPY;  Surgeon: [name], MD;  Location: [facility] ENDOSCOPY;  Service: Endoscopy;  Laterality: N/A;   COLONOSCOPY     WRIST FRACTURE SURGERY      Family History  Problem Relation Age of Onset   Prostate cancer Father     Social History   Socioeconomic History   Marital status: Single    Spouse name: Not on file   Number of children: 0   Years of education: Not on file   Highest education level: Not on file  Occupational History   Occupation: unemployed  Tobacco Use   Smoking status: Some Days    Types: Cigarettes   Smokeless tobacco: Never  Vaping Use   Vaping Use: Never used  Substance and Sexual Activity   Alcohol use: Yes    Alcohol/week: 0.0 standard drinks of alcohol   Drug use: Not Currently    Types: Marijuana   Sexual activity: Yes  Other Topics Concern    Not on file  Social History Narrative   ** Merged History Encounter **       Single, 1 son born 2018. Lives w/ mom + Smoker of tobacco and marijuana Disability ?   Social Determinants of Health   Financial Resource Strain: Not on file  Food Insecurity: Not on file  Transportation Needs: Not on file  Physical Activity: Not on file  Stress: Not on file  Social Connections: Not on file  Intimate Partner Violence: Not on file     Physical Exam   Vitals:   04/28/22 0249 04/28/22 0554  BP: (!) 104/55   Pulse: (!) 52   Resp: 19   Temp: 98.6 F (37 C) 98.7 F (37.1 C)  SpO2: 100%     CONSTITUTIONAL: Well-appearing, NAD NEURO/PSYCH:  Alert and oriented x 3, no focal deficits EYES:  eyes equal and reactive ENT/NECK:  no LAD, no JVD CARDIO: Regular rate, well-perfused, normal S1 and S2 PULM:  CTAB no wheezing or rhonchi GI/GU:  non-distended, non-tender MSK/SPINE:  No gross deformities, no edema SKIN:  no rash, atraumatic   *Additional and/or pertinent findings included in MDM below  Diagnostic and Interventional Summary    EKG Interpretation  Date/Time:  Sunday April 27 2022 23:31:19 EDT Ventricular Rate:  85 PR Interval:  148 QRS Duration: 98 QT  Interval:  374 QTC Calculation: 445 R Axis:   58 Text Interpretation: Normal sinus rhythm Normal ECG When compared with ECG of 19-Apr-2022 19:31, PREVIOUS ECG IS PRESENT Confirmed by Gerlene Fee [id number]) on 04/28/2022 5:27:50 AM       Labs Reviewed  CBC WITH DIFFERENTIAL/PLATELET - Abnormal; Notable for the following components:      Result Value   Lymphs Abs 5.2 (*)    All other components within normal limits  BASIC METABOLIC PANEL - Abnormal; Notable for the following components:   Glucose, Bld 112 (*)    All other components within normal limits  TROPONIN I (HIGH SENSITIVITY)  TROPONIN I (HIGH SENSITIVITY)    DG Chest 2 View  Final Result      Medications - No data to display   Procedures  /   Critical Care Procedures  ED Course and Medical Decision Making  Initial Impression and Ddx Favoring MSK chest pain, highly doubt PE or dissection or ACS.  Sitting comfortably, a bit reproducible on exam, definitely reproducible when he tries to sit up in bed.  Reassuring work-up, appropriate for discharge.  Past medical/surgical history that increases complexity of ED encounter: None  Interpretation of Diagnostics I personally reviewed the EKG and my interpretation is as follows: Sinus rhythm, no concerning ischemic features  Labs reassuring with no significant blood count or electrolyte disturbance, troponin negative  Patient Reassessment and Ultimate Disposition/Management     Discharge  Patient management required discussion with the following services or consulting groups:  None  Complexity of Problems Addressed Acute illness or injury that poses threat of life of bodily function  Additional Data Reviewed and Analyzed Further history obtained from: None  Additional Factors Impacting ED Encounter Risk Prescriptions  Barth Kirks. Sedonia Small, [place] [email or web address]  Final Clinical Impressions(s) / ED Diagnoses     ICD-10-CM   1. Chest pain, unspecified type  R07.9       ED Discharge Orders          Ordered    naproxen (NAPROSYN) 500 MG tablet  2 times daily        04/28/22 0553             Discharge Instructions Discussed with and Provided to Patient:    Discharge Instructions      You were evaluated in the Emergency Department and after careful evaluation, we did not find any emergent condition requiring admission or further testing in the hospital.  Your exam/testing today is overall reassuring.  Symptoms likely due to muscular strain or spasm of the muscles of the chest wall.  Recommend taking the Naprosyn twice daily as directed.  Please return to the Emergency Department if you experience any  worsening of your condition.   Thank you for allowing [place] to be a part of your care.      Maudie Flakes, MD 04/28/22 [phone]

## 2022-04-28 NOTE — Discharge Instructions (Signed)
You were evaluated in the Emergency Department and after careful evaluation, we did not find any emergent condition requiring admission or further testing in the hospital.  Your exam/testing today is overall reassuring.  Symptoms likely due to muscular strain or spasm of the muscles of the chest wall.  Recommend taking the Naprosyn twice daily as directed.  Please return to the Emergency Department if you experience any worsening of your condition.   Thank you for allowing [place] to be a part of your care.

## 2022-05-11 ENCOUNTER — Encounter (HOSPITAL_BASED_OUTPATIENT_CLINIC_OR_DEPARTMENT_OTHER): Payer: Self-pay | Admitting: Emergency Medicine

## 2022-05-11 ENCOUNTER — Other Ambulatory Visit: Payer: Self-pay

## 2022-05-11 ENCOUNTER — Emergency Department (HOSPITAL_BASED_OUTPATIENT_CLINIC_OR_DEPARTMENT_OTHER)
Admission: EM | Admit: 2022-05-11 | Discharge: 2022-05-11 | Disposition: A | Discharge status: Home / Self Care | Payer: Medicaid Other | Attending: Emergency Medicine | Admitting: Emergency Medicine

## 2022-05-11 DIAGNOSIS — H66002 Acute suppurative otitis media without spontaneous rupture of ear drum, left ear: Secondary | ICD-10-CM | POA: Insufficient documentation

## 2022-05-11 DIAGNOSIS — H9202 Otalgia, left ear: Secondary | ICD-10-CM | POA: Diagnosis present

## 2022-05-11 MED ORDER — AZITHROMYCIN 250 MG PO TABS: 250.0000 mg | ORAL_TABLET | Freq: Every day | ORAL | 0 refills | Status: DC

## 2022-05-11 MED ORDER — AZITHROMYCIN 250 MG PO TABS
250.0000 mg | ORAL_TABLET | Freq: Every day | ORAL | 0 refills | Status: AC
Start: 2022-05-12 — End: 2022-05-16

## 2022-05-11 MED ORDER — AZITHROMYCIN 250 MG PO TABS
500.0000 mg | ORAL_TABLET | Freq: Once | ORAL | Status: AC
  Administered 2022-05-11: 500 mg via ORAL
  Filled 2022-05-11: qty 2

## 2022-05-11 NOTE — ED Notes (Signed)
D/c paperwork reviewed prior to d/c. All questions addressed prior to d/c. Pt ambulatory to ED exit, NAD.

## 2022-05-11 NOTE — ED Triage Notes (Signed)
Patient c/o left ear pain since earlier today, states he is unable to hear out of his ear. Patient did buy earache relief drops pta without improvement.

## 2022-05-11 NOTE — ED Provider Notes (Signed)
[place] [facility] EMERGENCY DEPARTMENT  Provider Note  CSN: [id number] Arrival date & time: 05/11/22 2206  History Chief Complaint  Patient presents with   Ear Pain    Philip Joseph is a 31 y.o. male with history of crohn's presents for evaluation of L otalgia ongoing for 1 day, associated with decreased hearing. No fevers. No recent URI symptoms. Has not been swimming. No drainage from the ear.    Home Medications Prior to Admission medications   Medication Sig Start Date End Date Taking? Authorizing Provider  azithromycin (ZITHROMAX) 250 MG tablet Take 1 tablet (250 mg total) by mouth daily for 4 days. First dose in ER 05/12/22 05/16/22  Truddie Hidden, MD  cholecalciferol (VITAMIN D3) 25 MCG (1000 UNIT) tablet Take 1 tablet (1,000 Units total) by mouth daily. 11/13/21   [name], MD  dicyclomine (BENTYL) 20 MG tablet Take 1 tablet (20 mg total) by mouth 4 (four) times daily as needed for spasms. 03/12/21   [name], MD  inFLIXimab (REMICADE) 100 MG injection Remicade 5 mg/kg IV on week 0,2,6 then q 8 weeks 01/18/21   [name], MD  naproxen (NAPROSYN) 500 MG tablet Take 1 tablet (500 mg total) by mouth 2 (two) times daily. 04/28/22   Maudie Flakes, MD  ondansetron (ZOFRAN ODT) 4 MG disintegrating tablet Take 1 tablet (4 mg total) by mouth every 8 (eight) hours as needed for nausea or vomiting. 05/27/20   Lorin Glass, PA-C  Vitamin D, Ergocalciferol, (DRISDOL) 1.25 MG (50000 UNIT) CAPS capsule Take 1 capsule (50,000 Units total) by mouth every 7 (seven) days. 11/13/21   [name], MD     Allergies    Penicillins   Review of Systems   Review of Systems Please see HPI for pertinent positives and negatives  Physical Exam BP (!) 124/98 (BP Location: Left Arm)   Pulse 72   Temp 98 F (36.7 C) (Oral)   Resp 18   Ht 5' 9"  (1.753 m)   Wt 68 kg   SpO2 97%   BMI 22.15 kg/m   Physical Exam Vitals and nursing note reviewed.   Constitutional:      Appearance: Normal appearance.  HENT:     Head: Normocephalic and atraumatic.     Right Ear: Tympanic membrane and ear canal normal.     Left Ear: Ear canal normal.     Ears:     Comments: L TM with erythema and pus    Nose: Nose normal.     Mouth/Throat:     Mouth: Mucous membranes are moist.  Eyes:     Extraocular Movements: Extraocular movements intact.     Conjunctiva/sclera: Conjunctivae normal.  Cardiovascular:     Rate and Rhythm: Normal rate.  Pulmonary:     Effort: Pulmonary effort is normal.     Breath sounds: Normal breath sounds.  Abdominal:     General: Abdomen is flat.     Palpations: Abdomen is soft.     Tenderness: There is no abdominal tenderness.  Musculoskeletal:        General: No swelling. Normal range of motion.     Cervical back: Neck supple.  Skin:    General: Skin is warm and dry.  Neurological:     General: No focal deficit present.     Mental Status: He is alert.  Psychiatric:        Mood and Affect: Mood normal.  ED Results / Procedures / Treatments   EKG None  Procedures Procedures  Medications Ordered in the ED Medications  azithromycin (ZITHROMAX) tablet 500 mg (has no administration in time range)    Initial Impression and Plan  Patient here with ear pain, has OM on exam. External canal is clear. He has a PCN allergy, will plan Rx for Zithromax. PCP follow up.   ED Course       MDM Rules/Calculators/A&P Medical Decision Making Problems Addressed: Acute suppurative otitis media of left ear without spontaneous rupture of tympanic membrane, recurrence not specified: acute illness or injury  Risk Prescription drug management.    Final Clinical Impression(s) / ED Diagnoses Final diagnoses:  Acute suppurative otitis media of left ear without spontaneous rupture of tympanic membrane, recurrence not specified    Rx / DC Orders ED Discharge Orders          Ordered    azithromycin (ZITHROMAX)  250 MG tablet  Daily,   Status:  Discontinued        05/11/22 2313    azithromycin (ZITHROMAX) 250 MG tablet  Daily        05/11/22 2313             Truddie Hidden, MD 05/11/22 2314

## 2022-05-12 ENCOUNTER — Other Ambulatory Visit (HOSPITAL_BASED_OUTPATIENT_CLINIC_OR_DEPARTMENT_OTHER): Payer: Self-pay

## 2022-05-12 ENCOUNTER — Encounter: Payer: Self-pay | Admitting: *Deleted

## 2022-05-17 ENCOUNTER — Encounter (HOSPITAL_BASED_OUTPATIENT_CLINIC_OR_DEPARTMENT_OTHER): Payer: Self-pay | Admitting: Emergency Medicine

## 2022-05-17 ENCOUNTER — Other Ambulatory Visit: Payer: Self-pay

## 2022-05-17 ENCOUNTER — Emergency Department (HOSPITAL_BASED_OUTPATIENT_CLINIC_OR_DEPARTMENT_OTHER)
Admission: EM | Admit: 2022-05-17 | Discharge: 2022-05-17 | Disposition: A | Discharge status: Home / Self Care | Payer: Medicaid Other | Attending: Emergency Medicine | Admitting: Emergency Medicine

## 2022-05-17 DIAGNOSIS — H6692 Otitis media, unspecified, left ear: Secondary | ICD-10-CM

## 2022-05-17 DIAGNOSIS — H9202 Otalgia, left ear: Secondary | ICD-10-CM | POA: Diagnosis present

## 2022-05-17 MED ORDER — CEFDINIR 300 MG PO CAPS: 300.0000 mg | ORAL_CAPSULE | Freq: Two times a day (BID) | ORAL | 0 refills | Status: DC

## 2022-05-17 MED ORDER — IBUPROFEN 400 MG PO TABS
600.0000 mg | ORAL_TABLET | Freq: Once | ORAL | Status: AC
  Administered 2022-05-17: 600 mg via ORAL
  Filled 2022-05-17: qty 1

## 2022-05-17 NOTE — Discharge Instructions (Addendum)
Note the work-up today was overall consistent with your infection as we discussed, we will change antibiotics to more broad-spectrum antibiotic.  I recommend setting an appointment with the ENT outpatient for further evaluation.  Please do not hesitate to return to the emergency department if the worrisome signs and symptoms we discussed become apparent.

## 2022-05-17 NOTE — ED Triage Notes (Signed)
Patient reports continues to have pain to left ear. Pt recently diagnosed with ear infection and given azithromycin. Patient completed treatment.

## 2022-05-17 NOTE — ED Provider Notes (Addendum)
[facility] EMERGENCY DEPARTMENT Provider Note   CSN: [id number] Arrival date & time: 05/17/22  1032     History  Chief Complaint  Patient presents with   Otalgia    Philip Joseph is a 31 y.o. male.   Otalgia   31 year old male presents emergency department with complaints of left ear pain.  Patient was recently seen in the emergency department on 05/11/2022 for the same complaint.  He states that he has been taking his azithromycin at home as prescribed.  She is antibiotics ran out yesterday.  He complains of persistent left ear pain since his previous ER visit but did not want to come back until antibiotics were completed.  Denies changes in hearing.  Denies drainage from ear.  Denies fever, cough, congestion, sore throat, chills, night sweats.  Denies history of ear infections up until his previous ER visit.  Past medical history significant for Crohn's of which she takes infliximab, ADHD  Home Medications Prior to Admission medications   Medication Sig Start Date End Date Taking? Authorizing Provider  cefdinir (OMNICEF) 300 MG capsule Take 1 capsule (300 mg total) by mouth 2 (two) times daily. 05/17/22  Yes Dion Saucier A, PA  cholecalciferol (VITAMIN D3) 25 MCG (1000 UNIT) tablet Take 1 tablet (1,000 Units total) by mouth daily. 11/13/21   [name], MD  dicyclomine (BENTYL) 20 MG tablet Take 1 tablet (20 mg total) by mouth 4 (four) times daily as needed for spasms. 03/12/21   [name], MD  inFLIXimab (REMICADE) 100 MG injection Remicade 5 mg/kg IV on week 0,2,6 then q 8 weeks 01/18/21   [name], MD  naproxen (NAPROSYN) 500 MG tablet Take 1 tablet (500 mg total) by mouth 2 (two) times daily. 04/28/22   Maudie Flakes, MD  ondansetron (ZOFRAN ODT) 4 MG disintegrating tablet Take 1 tablet (4 mg total) by mouth every 8 (eight) hours as needed for nausea or vomiting. 05/27/20   Lorin Glass, PA-C  Vitamin D, Ergocalciferol, (DRISDOL) 1.25 MG  (50000 UNIT) CAPS capsule Take 1 capsule (50,000 Units total) by mouth every 7 (seven) days. 11/13/21   [name], MD      Allergies    Penicillins    Review of Systems   Review of Systems  HENT:  Positive for ear pain.   All other systems reviewed and are negative.   Physical Exam Updated Vital Signs BP 131/88 (BP Location: Left Arm)   Pulse 76   Temp 98 F (36.7 C) (Oral)   Resp 18   Ht 5' 9"  (1.753 m)   Wt 68 kg   SpO2 100%   BMI 22.15 kg/m  Physical Exam Vitals and nursing note reviewed.  Constitutional:      General: He is not in acute distress.    Appearance: Normal appearance. He is well-developed.  HENT:     Head: Normocephalic and atraumatic.     Right Ear: Tympanic membrane normal. No mastoid tenderness.     Left Ear: External ear normal. A middle ear effusion is present. No mastoid tenderness. No hemotympanum. Tympanic membrane is erythematous. Tympanic membrane is not injected, perforated or bulging.     Ears:     Comments: Purulent middle ear effusion on left side.  No obvious cholesteatoma visualized.  Patient has erythematous TM with extension into the external ear canal.  No obvious foreign body or drainage and external ear canal on left side.    Mouth/Throat:  Mouth: Mucous membranes are moist.     Pharynx: Oropharynx is clear. No posterior oropharyngeal erythema.  Eyes:     Conjunctiva/sclera: Conjunctivae normal.  Cardiovascular:     Rate and Rhythm: Normal rate and regular rhythm.     Heart sounds: No murmur heard. Pulmonary:     Effort: Pulmonary effort is normal. No respiratory distress.     Breath sounds: Normal breath sounds. No wheezing or rales.  Abdominal:     Palpations: Abdomen is soft.     Tenderness: There is no abdominal tenderness.  Musculoskeletal:        General: No swelling.     Cervical back: Neck supple.  Skin:    General: Skin is warm and dry.     Capillary Refill: Capillary refill takes less than 2 seconds.   Neurological:     Mental Status: He is alert.  Psychiatric:        Mood and Affect: Mood normal.     ED Results / Procedures / Treatments   Labs (all labs ordered are listed, but only abnormal results are displayed) Labs Reviewed - No data to display  EKG None  Radiology No results found.  Procedures Procedures    Medications Ordered in ED Medications - No data to display  ED Course/ Medical Decision Making/ A&P                           Medical Decision Making  This patient presents to the ED for concern of ear pain, this involves an extensive number of treatment options, and is a complaint that carries with it a high risk of complications and morbidity.  The differential diagnosis includes otitis media, otitis externa, mastoiditis, cholesteatoma, perforated TM, foreign body retention   Co morbidities that complicate the patient evaluation  See HPI   Additional history obtained:  Additional history obtained from EMR External records from outside source obtained and reviewed including prior antibiotics upon discharge from 05/11/2022   Lab Tests:  N/a   Imaging Studies ordered:  N/a   Cardiac Monitoring: / EKG:  The patient was maintained on a cardiac monitor.  I personally viewed and interpreted the cardiac monitored which showed an underlying rhythm of: Sinus rhythm   Consultations Obtained:  N/a   Problem List / ED Course / Critical interventions / Medication management  Otitis media Reevaluation of the patient  showed that the patient stayed the same I have reviewed the patients home medicines and have made adjustments as needed   Social Determinants of Health:  Former cigarette use   Test / Admission - Considered:  Otitis media Vitals signs  within normal range and stable throughout visit. Laboratory/imaging studies significant for: See above Patient symptoms likely secondary to otitis media.  Patient placed on a azithromycin from  prior ER visit but failed treatment therapy.  Patient baseline immunocompromise secondary to Crohn's treatment at home.  Patient's antibiotic will be changed for more broad-spectrum coverage with close ENT follow-up outpatient.  Treatment plan was discussed at length with patient and he acknowledged understanding was agreeable to said plan. Worrisome signs and symptoms were discussed with the patient, and the patient acknowledged understanding to return to the ED if noticed. Patient was stable upon discharge.         Final Clinical Impression(s) / ED Diagnoses Final diagnoses:  Left otitis media, unspecified otitis media type    Rx / DC Orders ED Discharge Orders  Ordered    cefdinir (OMNICEF) 300 MG capsule  2 times daily        05/17/22 1230              Wilnette Kales, [place] 05/17/22 1229    Wilnette Kales, [place] 05/17/22 [place], [place], MD 06/02/22 [phone]

## 2022-06-03 ENCOUNTER — Non-Acute Institutional Stay (HOSPITAL_COMMUNITY)
Admission: RE | Admit: 2022-06-03 | Discharge: 2022-06-03 | Disposition: A | Discharge status: Home / Self Care | Payer: Medicaid Other | Source: Ambulatory Visit | Attending: Internal Medicine | Admitting: Internal Medicine

## 2022-06-03 DIAGNOSIS — K50012 Crohn's disease of small intestine with intestinal obstruction: Secondary | ICD-10-CM | POA: Insufficient documentation

## 2022-06-03 MED ORDER — DIPHENHYDRAMINE HCL 25 MG PO CAPS
50.0000 mg | ORAL_CAPSULE | Freq: Once | ORAL | Status: AC
  Administered 2022-06-03: 50 mg via ORAL
  Filled 2022-06-03: qty 2

## 2022-06-03 MED ORDER — SODIUM CHLORIDE 0.9 % IV SOLN: INTRAVENOUS | Status: DC | PRN

## 2022-06-03 MED ORDER — SODIUM CHLORIDE 0.9 % IV SOLN
5.0000 mg/kg | INTRAVENOUS | Status: DC
  Administered 2022-06-03: 300 mg via INTRAVENOUS
  Filled 2022-06-03: qty 30

## 2022-06-03 MED ORDER — ACETAMINOPHEN 325 MG PO TABS
650.0000 mg | ORAL_TABLET | Freq: Once | ORAL | Status: AC
  Administered 2022-06-03: 650 mg via ORAL
  Filled 2022-06-03: qty 2

## 2022-06-03 NOTE — Progress Notes (Signed)
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 Patient pre medicated with Tylenol and benadryl per orders. Infusion titrated per protocol. Patient tolerated infusion with no adverse reaction. Pt vital signs stable. Pt declined to stay for 1 hour post infusion, and declined AVS. Pt alert, oriented, and ambulatory at discharge.

## 2022-06-20 ENCOUNTER — Emergency Department (HOSPITAL_COMMUNITY)
Admission: EM | Admit: 2022-06-20 | Discharge: 2022-06-20 | Disposition: A | Discharge status: Home / Self Care | Payer: Medicaid Other | Attending: Emergency Medicine | Admitting: Emergency Medicine

## 2022-06-20 ENCOUNTER — Encounter (HOSPITAL_COMMUNITY): Payer: Self-pay

## 2022-06-20 DIAGNOSIS — F17210 Nicotine dependence, cigarettes, uncomplicated: Secondary | ICD-10-CM | POA: Insufficient documentation

## 2022-06-20 DIAGNOSIS — W260XXA Contact with knife, initial encounter: Secondary | ICD-10-CM | POA: Insufficient documentation

## 2022-06-20 DIAGNOSIS — S61012A Laceration without foreign body of left thumb without damage to nail, initial encounter: Secondary | ICD-10-CM | POA: Insufficient documentation

## 2022-06-20 DIAGNOSIS — S6992XA Unspecified injury of left wrist, hand and finger(s), initial encounter: Secondary | ICD-10-CM | POA: Diagnosis present

## 2022-06-20 DIAGNOSIS — Z23 Encounter for immunization: Secondary | ICD-10-CM | POA: Insufficient documentation

## 2022-06-20 MED ORDER — TETANUS-DIPHTH-ACELL PERTUSSIS 5-2.5-18.5 LF-MCG/0.5 IM SUSY
0.5000 mL | PREFILLED_SYRINGE | Freq: Once | INTRAMUSCULAR | Status: AC
  Administered 2022-06-20: 0.5 mL via INTRAMUSCULAR
  Filled 2022-06-20: qty 0.5

## 2022-06-20 MED ORDER — HYDROCODONE-ACETAMINOPHEN 5-325 MG PO TABS
1.0000 | ORAL_TABLET | Freq: Once | ORAL | Status: DC
  Administered 2022-06-20: 1 via ORAL
  Filled 2022-06-20: qty 1

## 2022-06-20 MED ORDER — LIDOCAINE HCL (PF) 1 % IJ SOLN
5.0000 mL | Freq: Once | INTRAMUSCULAR | Status: DC
  Filled 2022-06-20: qty 30

## 2022-06-20 NOTE — ED Triage Notes (Signed)
Pt arrived via EMS, cut thumb left hand from knife. Bleeding controlled.

## 2022-06-20 NOTE — ED Notes (Addendum)
Approx 1" lac to inside of pts thumb.  Re-wrapped to control bleeding and advised pt to keep it elevated

## 2022-06-20 NOTE — ED Provider Notes (Signed)
[facility] DEPT Provider Note   CSN: [id number] Arrival date & time: 06/20/22  1207     History  Chief Complaint  Patient presents with   Laceration    Philip Joseph is a 31 y.o. male.   Laceration   31 year old male presents emergency department with complaints of laceration to his left thumb.  Patient states that he was cutting steak and salad with a knife earlier today and cut the palmar aspect of left thumb.  Denies loss of mobility but states he has numbness distal to affected laceration.  Presents emergency department for further evaluation.  Bleeding is short with direct pressure.  Patient states he is taken no blood thinner.  States his last tetanus is not up-to-date.  Past medical history significant for Crohn's ileitis, cigarette use, ADHD  Home Medications Prior to Admission medications   Medication Sig Start Date End Date Taking? Authorizing Provider  cefdinir (OMNICEF) 300 MG capsule Take 1 capsule (300 mg total) by mouth 2 (two) times daily. 05/17/22   Wilnette Kales, PA  cholecalciferol (VITAMIN D3) 25 MCG (1000 UNIT) tablet Take 1 tablet (1,000 Units total) by mouth daily. 11/13/21   [name], MD  dicyclomine (BENTYL) 20 MG tablet Take 1 tablet (20 mg total) by mouth 4 (four) times daily as needed for spasms. 03/12/21   [name], MD  inFLIXimab (REMICADE) 100 MG injection Remicade 5 mg/kg IV on week 0,2,6 then q 8 weeks 01/18/21   [name], MD  naproxen (NAPROSYN) 500 MG tablet Take 1 tablet (500 mg total) by mouth 2 (two) times daily. 04/28/22   Maudie Flakes, MD  ondansetron (ZOFRAN ODT) 4 MG disintegrating tablet Take 1 tablet (4 mg total) by mouth every 8 (eight) hours as needed for nausea or vomiting. 05/27/20   Lorin Glass, PA-C  Vitamin D, Ergocalciferol, (DRISDOL) 1.25 MG (50000 UNIT) CAPS capsule Take 1 capsule (50,000 Units total) by mouth every 7 (seven) days. 11/13/21   [name], MD       Allergies    Penicillins    Review of Systems   Review of Systems  All other systems reviewed and are negative.   Physical Exam Updated Vital Signs BP 118/73 (BP Location: Right Arm)   Pulse 98   Temp 98.3 F (36.8 C) (Oral)   Resp 18   SpO2 100%  Physical Exam Vitals and nursing note reviewed.  Constitutional:      General: He is not in acute distress.    Appearance: He is well-developed.  HENT:     Head: Normocephalic and atraumatic.  Eyes:     Conjunctiva/sclera: Conjunctivae normal.  Cardiovascular:     Rate and Rhythm: Normal rate and regular rhythm.     Heart sounds: No murmur heard. Pulmonary:     Effort: Pulmonary effort is normal. No respiratory distress.     Breath sounds: Normal breath sounds.  Abdominal:     Palpations: Abdomen is soft.     Tenderness: There is no abdominal tenderness.  Musculoskeletal:        General: No swelling.     Cervical back: Neck supple.     Comments: Patient has full range of motion of left thumb in flexion/extension/abduction/adduction.  Patient complaining of mild sensory deficits distal to said laceration.  Bleeding shoulder direct pressure.  Skin:    General: Skin is warm and dry.     Capillary Refill: Capillary refill takes less than  2 seconds.     Comments: 1 cm laceration noted parallel to thumb in the middle of palmar aspect.  No obvious foreign body noted.  Neurological:     Mental Status: He is alert.  Psychiatric:        Mood and Affect: Mood normal.     ED Results / Procedures / Treatments   Labs (all labs ordered are listed, but only abnormal results are displayed) Labs Reviewed - No data to display  EKG None  Radiology No results found.  Procedures .Marland KitchenLaceration Repair  Date/Time: 06/20/2022 3:42 PM  Performed by: Wilnette Kales, PA Authorized by: Wilnette Kales, PA   Consent:    Consent obtained:  Verbal   Consent given by:  Patient   Risks, benefits, and alternatives were  discussed: yes     Risks discussed:  Infection, need for additional repair, nerve damage, poor cosmetic result, pain, poor wound healing, retained foreign body, vascular damage and tendon damage   Alternatives discussed:  No treatment, delayed treatment, referral and observation Universal protocol:    Procedure explained and questions answered to patient or proxy's satisfaction: yes     Patient identity confirmed:  Verbally with patient Anesthesia:    Anesthesia method:  Nerve block   Block location:  Thumb   Block needle gauge:  25 G   Block anesthetic:  Lidocaine 1% w/o epi   Block injection procedure:  Anatomic landmarks identified, introduced needle, incremental injection, negative aspiration for blood and anatomic landmarks palpated   Block outcome:  Incomplete block (Patient was then given local infiltrative anesthetic) Laceration details:    Location:  Finger   Finger location:  L thumb   Length (cm):  2.7 Pre-procedure details:    Preparation:  Patient was prepped and draped in usual sterile fashion Exploration:    Limited defect created (wound extended): no     Hemostasis achieved with:  Direct pressure   Imaging outcome: foreign body not noted     Wound exploration: wound explored through full range of motion     Contaminated: no   Treatment:    Area cleansed with:  Povidone-iodine and saline   Amount of cleaning:  Standard   Irrigation solution:  Sterile saline   Irrigation volume:  500cc   Irrigation method:  Syringe   Visualized foreign bodies/material removed: no     Debridement:  None   Undermining:  None   Scar revision: no   Skin repair:    Repair method:  Sutures   Suture size:  4-0   Suture material:  Nylon   Number of sutures:  3 Approximation:    Approximation:  Close Repair type:    Repair type:  Simple Post-procedure details:    Dressing:  Bulky dressing, splint for protection and non-adherent dressing   Procedure completion:  Tolerated well, no  immediate complications     Medications Ordered in ED Medications  Tdap (BOOSTRIX) injection 0.5 mL (0.5 mLs Intramuscular Given 06/20/22 1407)    ED Course/ Medical Decision Making/ A&P                           Medical Decision Making Risk Prescription drug management.   This patient presents to the ED for concern of laceration, this involves an extensive number of treatment options, and is a complaint that carries with it a high risk of complications and morbidity.  The differential diagnosis includes foreign body retainment, ligamentous/tendinous  injury, neurovascular compromise, fracture   Co morbidities that complicate the patient evaluation  See HPI   Additional history obtained:  Additional history obtained from EMR External records from outside source obtained and reviewed including hospital records   Lab Tests:  N/a   Imaging Studies ordered:  N/a   Cardiac Monitoring: / EKG:  The patient was maintained on a cardiac monitor.  I personally viewed and interpreted the cardiac monitored which showed an underlying rhythm of: Sinus rhythm   Consultations Obtained:  N/a   Problem List / ED Course / Critical interventions / Medication management  Laceration I ordered medication including Norco for pain   Reevaluation of the patient after these medicines showed that the patient improved I have reviewed the patients home medicines and have made adjustments as needed   Social Determinants of Health:  Denies tobacco, illicit drug use   Test / Admission - Considered:  Laceration Vitals signs  within normal range and stable throughout visit. X-ray imaging considered but deemed unnecessary due to patient's history.  Mechanism of injury consistent for cut with no additional trauma so low suspicion for fracture.  Low suspicion also for foreign body retainment given clean knife.  Wound was repaired in manner indicated above.  Patient educated regarding  proper wound care.  Tetanus updated while emergency department.  Patient recommended follow-up in 10 to 14 days for suture removal.  Treatment plan discussed at length with patient he acknowledged understand was agreeable to said plan.  Worrisome signs and symptoms were discussed with the patient, and the patient acknowledged understanding to return to the ED if noticed. Patient was stable upon discharge.          Final Clinical Impression(s) / ED Diagnoses Final diagnoses:  Laceration of left thumb without foreign body without damage to nail, initial encounter    Rx / DC Orders ED Discharge Orders     None         Wilnette Kales, [place] 06/20/22 1545    Elgie Congo, MD 06/20/22 2104

## 2022-06-20 NOTE — Discharge Instructions (Signed)
Return to emergency department, primary care, urgent care for suture removal in 10 to 14 days.  Change bandages out once daily as discussed.  Wash area with warm soapy water.  Reconstruction information for proper wound care.  Watch for signs of infection as discussed.  Please do not hesitate to return to emergency department for worrisome signs symptoms we discussed, parent.

## 2022-06-20 NOTE — Progress Notes (Signed)
Orthopedic Tech Progress Note Patient Details:  Philip Joseph 1991-03-02 [id number]  Ortho Devices Type of Ortho Device: Finger splint, Ace wrap Ortho Device/Splint Location: Left thumb Ortho Device/Splint Interventions: Application   Post Interventions Patient Tolerated: Well Instructions Provided: Care of device  [name] 06/20/2022, 2:18 PM

## 2022-07-03 ENCOUNTER — Emergency Department (HOSPITAL_BASED_OUTPATIENT_CLINIC_OR_DEPARTMENT_OTHER)
Admission: EM | Admit: 2022-07-03 | Discharge: 2022-07-03 | Disposition: A | Discharge status: Home / Self Care | Payer: Medicaid Other | Attending: Emergency Medicine | Admitting: Emergency Medicine

## 2022-07-03 ENCOUNTER — Encounter (HOSPITAL_BASED_OUTPATIENT_CLINIC_OR_DEPARTMENT_OTHER): Payer: Self-pay | Admitting: Emergency Medicine

## 2022-07-03 ENCOUNTER — Other Ambulatory Visit: Payer: Self-pay

## 2022-07-03 DIAGNOSIS — Z4800 Encounter for change or removal of nonsurgical wound dressing: Secondary | ICD-10-CM | POA: Diagnosis present

## 2022-07-03 DIAGNOSIS — Z4802 Encounter for removal of sutures: Secondary | ICD-10-CM

## 2022-07-03 MED ORDER — IBUPROFEN 800 MG PO TABS: 800.0000 mg | ORAL_TABLET | Freq: Three times a day (TID) | ORAL | 0 refills | Status: DC | PRN

## 2022-07-03 MED ORDER — IBUPROFEN 800 MG PO TABS
800.0000 mg | ORAL_TABLET | Freq: Once | ORAL | Status: AC
  Administered 2022-07-03: 800 mg via ORAL
  Filled 2022-07-03: qty 1

## 2022-07-03 NOTE — ED Triage Notes (Signed)
Pt arrives to ED to have sutures removed from left thumb.

## 2022-07-03 NOTE — Discharge Instructions (Signed)
Return if any problems.

## 2022-07-03 NOTE — ED Notes (Signed)
3 sutures removed from left thumb

## 2022-07-03 NOTE — ED Notes (Signed)
Patient verbalizes understanding of discharge instructions. Opportunity for questioning and answers were provided. Patient discharged from ED.  °

## 2022-07-03 NOTE — ED Provider Notes (Signed)
[facility] EMERGENCY DEPT Provider Note   CSN: [id number] Arrival date & time: 07/03/22  1034     History  Chief Complaint  Patient presents with   Suture / Staple Removal    Philip Joseph is a 31 y.o. male.  Pt is here for suture removal.  Pt request something for pain.  Pt denies any other complaints   The history is provided by the patient. No language interpreter was used.  Suture / Staple Removal This is a new problem. The problem occurs constantly. The problem has not changed since onset.He has tried nothing for the symptoms.       Home Medications Prior to Admission medications   Medication Sig Start Date End Date Taking? Authorizing Provider  ibuprofen (ADVIL) 800 MG tablet Take 1 tablet (800 mg total) by mouth every 8 (eight) hours as needed. 07/03/22  Yes Fransico Meadow, PA-C  cefdinir (OMNICEF) 300 MG capsule Take 1 capsule (300 mg total) by mouth 2 (two) times daily. 05/17/22   Wilnette Kales, PA  cholecalciferol (VITAMIN D3) 25 MCG (1000 UNIT) tablet Take 1 tablet (1,000 Units total) by mouth daily. 11/13/21   [name], MD  dicyclomine (BENTYL) 20 MG tablet Take 1 tablet (20 mg total) by mouth 4 (four) times daily as needed for spasms. 03/12/21   [name], MD  inFLIXimab (REMICADE) 100 MG injection Remicade 5 mg/kg IV on week 0,2,6 then q 8 weeks 01/18/21   [name], MD  naproxen (NAPROSYN) 500 MG tablet Take 1 tablet (500 mg total) by mouth 2 (two) times daily. 04/28/22   Maudie Flakes, MD  ondansetron (ZOFRAN ODT) 4 MG disintegrating tablet Take 1 tablet (4 mg total) by mouth every 8 (eight) hours as needed for nausea or vomiting. 05/27/20   Lorin Glass, PA-C  Vitamin D, Ergocalciferol, (DRISDOL) 1.25 MG (50000 UNIT) CAPS capsule Take 1 capsule (50,000 Units total) by mouth every 7 (seven) days. 11/13/21   [name], MD      Allergies    Penicillins    Review of Systems   Review of Systems  All other  systems reviewed and are negative.   Physical Exam Updated Vital Signs BP 127/84 (BP Location: Right Arm)   Pulse 63   Temp 98 F (36.7 C)   Resp 16   Ht 5' 9"  (1.753 m)   Wt 68 kg   SpO2 100%   BMI 22.15 kg/m  Physical Exam Vitals reviewed.  Constitutional:      Appearance: Normal appearance.  Musculoskeletal:        General: No swelling.  Skin:    General: Skin is warm.     Comments: 3 sutures thumb.  From nv and ns intact   Neurological:     General: No focal deficit present.     Mental Status: He is alert.  Psychiatric:        Mood and Affect: Mood normal.     ED Results / Procedures / Treatments   Labs (all labs ordered are listed, but only abnormal results are displayed) Labs Reviewed - No data to display  EKG None  Radiology No results found.  Procedures Procedures    Medications Ordered in ED Medications  ibuprofen (ADVIL) tablet 800 mg (has no administration in time range)    ED Course/ Medical Decision Making/ A&P  Medical Decision Making Risk Prescription drug management.           Final Clinical Impression(s) / ED Diagnoses Final diagnoses:  Visit for suture removal    Rx / DC Orders ED Discharge Orders          Ordered    ibuprofen (ADVIL) 800 MG tablet  Every 8 hours PRN        07/03/22 1246           An After Visit Summary was printed and given to the patient.    Sidney Ace 07/03/22 1312    Elgie Congo, MD 07/03/22 1758

## 2022-07-29 ENCOUNTER — Non-Acute Institutional Stay (HOSPITAL_COMMUNITY)
Admission: RE | Admit: 2022-07-29 | Discharge: 2022-07-29 | Disposition: A | Discharge status: Home / Self Care | Payer: Medicaid Other | Source: Ambulatory Visit | Attending: Internal Medicine | Admitting: Internal Medicine

## 2022-07-29 DIAGNOSIS — K50012 Crohn's disease of small intestine with intestinal obstruction: Secondary | ICD-10-CM | POA: Diagnosis present

## 2022-07-29 MED ORDER — SODIUM CHLORIDE 0.9 % IV SOLN: INTRAVENOUS | Status: DC | PRN

## 2022-07-29 MED ORDER — SODIUM CHLORIDE 0.9 % IV SOLN
5.0000 mg/kg | INTRAVENOUS | Status: DC
  Administered 2022-07-29: 300 mg via INTRAVENOUS
  Filled 2022-07-29: qty 30

## 2022-07-29 MED ORDER — DIPHENHYDRAMINE HCL 25 MG PO CAPS
50.0000 mg | ORAL_CAPSULE | Freq: Once | ORAL | Status: AC
  Administered 2022-07-29: 50 mg via ORAL
  Filled 2022-07-29: qty 2

## 2022-07-29 MED ORDER — ACETAMINOPHEN 325 MG PO TABS
650.0000 mg | ORAL_TABLET | Freq: Once | ORAL | Status: AC
  Administered 2022-07-29: 650 mg via ORAL
  Filled 2022-07-29: qty 2

## 2022-07-29 NOTE — Progress Notes (Signed)
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 Patient pre-medicated with Tylenol and Benadryl per order. Infusion titrated per protocol. Patient tolerated well with no adverse reaction. Vital signs stable.Pt declined to stay for 1 hour observation post infusion and declined AVS. Patient advised to stop at the front desk and make next infusion appointment for 8 weeks from now.  Alert, oriented and ambulatory at discharge.

## 2022-08-09 ENCOUNTER — Other Ambulatory Visit: Payer: Self-pay

## 2022-08-09 ENCOUNTER — Emergency Department (HOSPITAL_COMMUNITY)
Admission: EM | Admit: 2022-08-09 | Discharge: 2022-08-10 | Disposition: A | Discharge status: Home / Self Care | Payer: Medicaid Other | Attending: Emergency Medicine | Admitting: Emergency Medicine

## 2022-08-09 ENCOUNTER — Encounter (HOSPITAL_COMMUNITY): Payer: Self-pay

## 2022-08-09 DIAGNOSIS — S39012A Strain of muscle, fascia and tendon of lower back, initial encounter: Secondary | ICD-10-CM | POA: Diagnosis not present

## 2022-08-09 DIAGNOSIS — X500XXA Overexertion from strenuous movement or load, initial encounter: Secondary | ICD-10-CM | POA: Insufficient documentation

## 2022-08-09 DIAGNOSIS — Y990 Civilian activity done for income or pay: Secondary | ICD-10-CM | POA: Insufficient documentation

## 2022-08-09 DIAGNOSIS — S3992XA Unspecified injury of lower back, initial encounter: Secondary | ICD-10-CM | POA: Diagnosis present

## 2022-08-09 NOTE — ED Triage Notes (Signed)
States that he is having back pain and chest pain, states he is unsure what is happening.

## 2022-08-09 NOTE — ED Provider Triage Note (Signed)
Emergency Medicine Provider Triage Evaluation Note  Philip Joseph , a 31 y.o. male  was evaluated in triage.  Pt complains of chest pain and back pain since 7:30 am this morning. Made worse with lying flat and movement. Some SOB. Hx of chron's, had some vomiting the other day that is his normal. No known injury to the back.   Review of Systems  Positive: Back pain, chest pain Negative: Abd pain, urinary sx  Physical Exam  BP 128/79 (BP Location: Right Arm)   Pulse 73   Temp 98.4 F (36.9 C) (Oral)   Resp 16   Ht 5' 7"  (1.702 m)   Wt 68 kg   SpO2 99%   BMI 23.49 kg/m  Gen:   Awake, no distress   Resp:  Normal effort  MSK:   Moves extremities without difficulty  Other:    Medical Decision Making  Medically screening exam initiated at 11:09 PM.  Appropriate orders placed.  Epimenio Foot was informed that the remainder of the evaluation will be completed by another provider, this initial triage assessment does not replace that evaluation, and the importance of remaining in the ED until their evaluation is complete.     Kateri Plummer, PA-C 08/09/22 2311

## 2022-08-10 LAB — TROPONIN I (HIGH SENSITIVITY): Troponin I (High Sensitivity): 2 ng/L (ref <18)

## 2022-08-10 LAB — URINALYSIS, ROUTINE W REFLEX MICROSCOPIC
Bilirubin Urine: NEGATIVE
Glucose, UA: NEGATIVE mg/dL
Hgb urine dipstick: NEGATIVE
Ketones, ur: NEGATIVE mg/dL
Leukocytes,Ua: NEGATIVE
Nitrite: NEGATIVE
Protein, ur: NEGATIVE mg/dL
Specific Gravity, Urine: 1.012 (ref 1.005–1.030)
pH: 6 (ref 5.0–8.0)

## 2022-08-10 LAB — CBC
HCT: 45.4 % (ref 39.0–52.0)
Hemoglobin: 16.1 g/dL (ref 13.0–17.0)
MCH: 32 pg (ref 26.0–34.0)
MCHC: 35.5 g/dL (ref 30.0–36.0)
MCV: 90.3 fL (ref 80.0–100.0)
Platelets: 197 10*3/uL (ref 150–400)
RBC: 5.03 MIL/uL (ref 4.22–5.81)
RDW: 11.5 % (ref 11.5–15.5)
WBC: 8.2 10*3/uL (ref 4.0–10.5)
nRBC: 0 % (ref 0.0–0.2)

## 2022-08-10 LAB — BASIC METABOLIC PANEL
Anion gap: 8 (ref 5–15)
BUN: 21 mg/dL — ABNORMAL HIGH (ref 6–20)
CO2: 26 mmol/L (ref 22–32)
Calcium: 9.4 mg/dL (ref 8.9–10.3)
Chloride: 102 mmol/L (ref 98–111)
Creatinine, Ser: 1.26 mg/dL — ABNORMAL HIGH (ref 0.61–1.24)
GFR, Estimated: >60 mL/min (ref >60)
Glucose, Bld: 102 mg/dL — ABNORMAL HIGH (ref 70–99)
Potassium: 3.9 mmol/L (ref 3.5–5.1)
Sodium: 136 mmol/L (ref 135–145)

## 2022-08-10 MED ORDER — LIDOCAINE 5 % EX PTCH: 1.0000 | MEDICATED_PATCH | CUTANEOUS | 0 refills | Status: DC

## 2022-08-10 MED ORDER — LIDOCAINE 5 % EX PTCH
2.0000 | MEDICATED_PATCH | CUTANEOUS | Status: DC
  Administered 2022-08-10: 2 via TRANSDERMAL
  Filled 2022-08-10: qty 2

## 2022-08-10 MED ORDER — IBUPROFEN 200 MG PO TABS
600.0000 mg | ORAL_TABLET | Freq: Once | ORAL | Status: AC
  Administered 2022-08-10: 600 mg via ORAL
  Filled 2022-08-10: qty 3

## 2022-08-10 NOTE — ED Provider Notes (Signed)
[facility] DEPT Provider Note   CSN: [id number] Arrival date & time: 08/09/22  2150     History  Chief Complaint  Patient presents with   Back Pain   Chest Pain    Philip Joseph is a 31 y.o. 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  States that for the last couple of months he is intermittently having what he describes as muscle spasms with cramping sensation across his lower back.  It is worse after some shifts at work.  No changes in urinary symptoms, no numbness or tingling in the lower extremities bilaterally, no saddle anesthesia.  No medications at home to try to assist with the pain.  When asked about chest pain reported to triage nurse, patient states he does not have chest pain but rather the pain from his lower back wraps around to the sides.  He denies any alleviating or exacerbating factors.  I have personally read the patient's medical records previous history of Crohn's disease on Remicade.  No anticoagulation.  Denies any bowel related symptoms today.  HPI     Home Medications Prior to Admission medications   Medication Sig Start Date End Date Taking? Authorizing Provider  lidocaine (LIDODERM) 5 % Place 1 patch onto the skin daily. Remove & Discard patch within 12 hours or as directed by MD 08/10/22  Yes Aundray Cartlidge, Eugene Garnet R, PA-C  cefdinir (OMNICEF) 300 MG capsule Take 1 capsule (300 mg total) by mouth 2 (two) times daily. 05/17/22   Wilnette Kales, PA  cholecalciferol (VITAMIN D3) 25 MCG (1000 UNIT) tablet Take 1 tablet (1,000 Units total) by mouth daily. 11/13/21   [name], MD  dicyclomine (BENTYL) 20 MG tablet Take 1 tablet (20 mg total) by mouth 4 (four) times daily as needed for spasms. 03/12/21   [name], MD  ibuprofen (ADVIL) 800 MG tablet Take 1 tablet (800 mg total) by mouth every 8 (eight) hours as needed. 07/03/22   Fransico Meadow, PA-C  inFLIXimab (REMICADE) 100 MG injection  Remicade 5 mg/kg IV on week 0,2,6 then q 8 weeks 01/18/21   [name], MD  naproxen (NAPROSYN) 500 MG tablet Take 1 tablet (500 mg total) by mouth 2 (two) times daily. 04/28/22   Maudie Flakes, MD  ondansetron (ZOFRAN ODT) 4 MG disintegrating tablet Take 1 tablet (4 mg total) by mouth every 8 (eight) hours as needed for nausea or vomiting. 05/27/20   Lorin Glass, PA-C  Vitamin D, Ergocalciferol, (DRISDOL) 1.25 MG (50000 UNIT) CAPS capsule Take 1 capsule (50,000 Units total) by mouth every 7 (seven) days. 11/13/21   [name], MD      Allergies    Penicillins    Review of Systems   Review of Systems  Musculoskeletal:  Positive for back pain.    Physical Exam Updated Vital Signs BP 128/79 (BP Location: Right Arm)   Pulse 73   Temp 98.4 F (36.9 C) (Oral)   Resp 16   Ht 5' 7"  (1.702 m)   Wt 68 kg   SpO2 99%   BMI 23.49 kg/m  Physical Exam Vitals and nursing note reviewed.  Constitutional:      Appearance: He is not ill-appearing or toxic-appearing.  HENT:     Head: Normocephalic and atraumatic.     Mouth/Throat:     Mouth: Mucous membranes are moist.     Pharynx: No oropharyngeal exudate or posterior oropharyngeal erythema.  Eyes:     General:        Right eye: No discharge.        Left eye: No discharge.     Conjunctiva/sclera: Conjunctivae normal.  Cardiovascular:     Rate and Rhythm: Normal rate and regular rhythm.     Pulses: Normal pulses.     Heart sounds: Murmur heard.  Pulmonary:     Effort: Pulmonary effort is normal. No respiratory distress.     Breath sounds: Normal breath sounds. No wheezing or rales.  Chest:     Chest wall: No mass, tenderness or edema.  Abdominal:     General: Bowel sounds are normal. There is no distension.     Palpations: Abdomen is soft.     Tenderness: There is no abdominal tenderness.  Musculoskeletal:        General: No deformity.     Cervical back: Normal and neck supple.     Thoracic back: Normal.      Lumbar back: Spasms and tenderness present. No bony tenderness. Normal range of motion. Negative right straight leg raise test and negative left straight leg raise test.     Right lower leg: No tenderness. No edema.     Left lower leg: No tenderness. No edema.  Skin:    General: Skin is warm and dry.     Capillary Refill: Capillary refill takes less than 2 seconds.     Findings: No rash.  Neurological:     General: No focal deficit present.     Mental Status: He is alert and oriented to person, place, and time. Mental status is at baseline.     GCS: GCS eye subscore is 4. GCS verbal subscore is 5. GCS motor subscore is 6.     Sensory: Sensation is intact.     Motor: Motor function is intact.     Coordination: Coordination is intact.     Gait: Gait is intact.  Psychiatric:        Mood and Affect: Mood normal.     ED Results / Procedures / Treatments   Labs (all labs ordered are listed, but only abnormal results are displayed) Labs Reviewed  BASIC METABOLIC PANEL - Abnormal; Notable for the following components:      Result Value   Glucose, Bld 102 (*)    BUN 21 (*)    Creatinine, Ser 1.26 (*)    All other components within normal limits  CBC  URINALYSIS, ROUTINE W REFLEX MICROSCOPIC  TROPONIN I (HIGH SENSITIVITY)  TROPONIN I (HIGH SENSITIVITY)    EKG None  Radiology No results found.  Procedures Procedures    Medications Ordered in ED Medications  lidocaine (LIDODERM) 5 % 2 patch (2 patches Transdermal Patch Applied 08/10/22 0337)  ibuprofen (ADVIL) tablet 600 mg (600 mg Oral Given 08/10/22 0334)    ED Course/ Medical Decision Making/ A&P                           Medical Decision Making 72 male presents with intermittent spasms of the lower back.  Ongoing for couple months, no fevers or chills.  Vital signs normal and intake.  Cardiopulmonary exam is normal, abdominal exam is benign.  Musculoskeletal exam with spasm tender to palpation of the lumbar  paraspinous musculature bilaterally.  No focal deficit on neurologic exam, no skin changes.   The emergent differential diagnosis for low back pain includes acute ligamentous and muscular injury, cord  compression syndrome, pathologic fracture, transverse myelitis, vertebral osteomyelitis, discitis, and epidural abscess. No trauma, clinical concern for infectious etiology exceedingly low given intermittent nature of symptoms, lack of systemic symptoms, and reassuring labs.    Amount and/or Complexity of Data Reviewed Labs:     Details: CBC without leukocytosis or anemia, BMP with mild elevation in creatinine to 1.2 from baseline of 1.  UA unremarkable, troponin negative.   ECG/medicine tests:     Details: EKG with normal sinus rhythm, some lateral ST changes but no STEMI.    Risk OTC drugs. Prescription drug management.   Clinical picture most consistent with acute muscular spasms of the lower back.  Lidocaine patches prescribed and administered in the emergency department.  Recommend OTC analgesia as well as topical pain relief.  Discussed at length with patient as well as appropriate form when lifting heavy objects at work.  Clinical concern for emergent underlying etiology of the warrant further ED workup or inpatient management is exceedingly low.  Damiel voiced understanding of her medical evaluation and treatment plan. Each of their questions answered to their expressed satisfaction.  Return precautions were given.  Patient is well-appearing, stable, and was discharged in good condition.  This chart was dictated using voice recognition software, Dragon. Despite the best efforts of this provider to proofread and correct errors, errors may still occur which can change documentation meaning.  Final Clinical Impression(s) / ED Diagnoses Final diagnoses:  Strain of lumbar region, initial encounter    Rx / DC Orders ED Discharge Orders          Ordered    lidocaine (LIDODERM) 5 %   Every 24 hours        08/10/22 0328              Kaushal Vannice, Gypsy Balsam, PA-C 08/10/22 4680    Merryl Hacker, MD 08/10/22 2358

## 2022-08-10 NOTE — Discharge Instructions (Signed)
You were seen in the emergency department today for your low back pain.  Your physical exam and vital signs are very reassuring.  The muscles in your back  are in what is called spasm, meaning they are inappropriately tightened up.  This can be quite painful.  To help with your pain you may take Tylenol and / or NSAID medication (such as ibuprofen or naproxen) to help with your pain.    You may also utilize topical pain relief such as Biofreeze, IcyHot, or topical lidocaine patches.  I also recommend that you apply heat to the area, such as a hot shower or heating pad, and follow heat application with massage of the muscles that are most tight.  Please return to the emergency department if you develop any numbness/tingling/weakness in your arms or legs, any difficulty urinating, or urinary incontinence chest pain, shortness of breath, abdominal pain, nausea or vomiting that does not stop, or any other new severe symptoms.

## 2022-09-23 ENCOUNTER — Non-Acute Institutional Stay (HOSPITAL_COMMUNITY)
Admission: RE | Admit: 2022-09-23 | Discharge: 2022-09-23 | Disposition: A | Discharge status: Home / Self Care | Payer: Medicaid Other | Source: Ambulatory Visit | Attending: Internal Medicine | Admitting: Internal Medicine

## 2022-09-23 DIAGNOSIS — K50012 Crohn's disease of small intestine with intestinal obstruction: Secondary | ICD-10-CM | POA: Diagnosis not present

## 2022-09-23 MED ORDER — SODIUM CHLORIDE 0.9 % IV SOLN
5.0000 mg/kg | INTRAVENOUS | Status: DC
  Administered 2022-09-23: 300 mg via INTRAVENOUS
  Filled 2022-09-23: qty 30

## 2022-09-23 MED ORDER — ACETAMINOPHEN 325 MG PO TABS
650.0000 mg | ORAL_TABLET | Freq: Once | ORAL | Status: AC
  Administered 2022-09-23: 650 mg via ORAL
  Filled 2022-09-23: qty 2

## 2022-09-23 MED ORDER — SODIUM CHLORIDE 0.9 % IV SOLN: INTRAVENOUS | Status: DC | PRN

## 2022-09-23 MED ORDER — DIPHENHYDRAMINE HCL 25 MG PO CAPS
50.0000 mg | ORAL_CAPSULE | Freq: Once | ORAL | Status: AC
  Administered 2022-09-23: 50 mg via ORAL
  Filled 2022-09-23: qty 2

## 2022-09-23 NOTE — Progress Notes (Signed)
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 Patient pre-medicated with Tylenol and Benadryl per order. Infusion titrated per protocol. Patient tolerated well with no adverse reaction. Vital signs stable.Pt declined to stay for 1 hour observation post infusion and declined AVS. Patient advised to stop at the front desk and make next infusion appointment for 8 weeks from now.  Alert, oriented and ambulatory at discharge.

## 2022-11-18 ENCOUNTER — Non-Acute Institutional Stay (HOSPITAL_COMMUNITY)
Admission: RE | Admit: 2022-11-18 | Discharge: 2022-11-18 | Disposition: A | Discharge status: Home / Self Care | Payer: Medicaid Other | Source: Ambulatory Visit | Attending: Internal Medicine | Admitting: Internal Medicine

## 2022-11-18 DIAGNOSIS — K50012 Crohn's disease of small intestine with intestinal obstruction: Secondary | ICD-10-CM | POA: Insufficient documentation

## 2022-11-18 MED ORDER — SODIUM CHLORIDE 0.9 % IV SOLN: INTRAVENOUS | Status: DC | PRN

## 2022-11-18 MED ORDER — DIPHENHYDRAMINE HCL 25 MG PO CAPS
50.0000 mg | ORAL_CAPSULE | Freq: Once | ORAL | Status: AC
  Administered 2022-11-18: 50 mg via ORAL
  Filled 2022-11-18: qty 2

## 2022-11-18 MED ORDER — SODIUM CHLORIDE 0.9 % IV SOLN
300.0000 mg | INTRAVENOUS | Status: DC
  Administered 2022-11-18: 300 mg via INTRAVENOUS
  Filled 2022-11-18: qty 30

## 2022-11-18 MED ORDER — ACETAMINOPHEN 325 MG PO TABS
650.0000 mg | ORAL_TABLET | Freq: Once | ORAL | Status: AC
  Administered 2022-11-18: 650 mg via ORAL
  Filled 2022-11-18: qty 2

## 2022-11-18 NOTE — Progress Notes (Signed)
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 Pt pre medicated with PO Tylenol and Benadryl. RN realized that the primary line was running without the medication being started. Infusion restarted and titrated per protocol. Pt tolerated infusion with no adverse reaction. Vital signs stable. Pt to return for next infusion in 8 weeks. Pt is alert, oriented, and ambulatory at discharge.

## 2022-12-30 ENCOUNTER — Encounter (HOSPITAL_COMMUNITY): Payer: Medicaid Other

## 2023-01-05 ENCOUNTER — Telehealth: Payer: Self-pay | Admitting: Internal Medicine

## 2023-01-05 NOTE — Telephone Encounter (Signed)
Inbound call from [facility] with [facility] hospital stating patient has an upcoming appointment and needs new orders put in for remicaid?please advise

## 2023-01-06 ENCOUNTER — Other Ambulatory Visit: Payer: Self-pay

## 2023-01-06 DIAGNOSIS — K50012 Crohn's disease of small intestine with intestinal obstruction: Secondary | ICD-10-CM

## 2023-01-07 ENCOUNTER — Other Ambulatory Visit: Payer: Self-pay

## 2023-01-07 DIAGNOSIS — K50012 Crohn's disease of small intestine with intestinal obstruction: Secondary | ICD-10-CM

## 2023-01-07 DIAGNOSIS — Z7960 Long term (current) use of unspecified immunomodulators and immunosuppressants: Secondary | ICD-10-CM

## 2023-01-07 NOTE — Telephone Encounter (Signed)
Orders placed in Epic: Left message for pt to call back to schedule an office visit with Dr. Leone Payor

## 2023-01-08 NOTE — Telephone Encounter (Signed)
Patient scheduled OV for 04/13/23 at 8:30 with Dr. Leone Payor.

## 2023-01-13 ENCOUNTER — Telehealth: Payer: Self-pay | Admitting: Internal Medicine

## 2023-01-13 ENCOUNTER — Non-Acute Institutional Stay (HOSPITAL_COMMUNITY)
Admission: RE | Admit: 2023-01-13 | Discharge: 2023-01-13 | Disposition: A | Discharge status: Home / Self Care | Payer: Medicaid Other | Source: Ambulatory Visit | Attending: Internal Medicine | Admitting: Internal Medicine

## 2023-01-13 DIAGNOSIS — Z7960 Long term (current) use of unspecified immunomodulators and immunosuppressants: Secondary | ICD-10-CM

## 2023-01-13 DIAGNOSIS — K50012 Crohn's disease of small intestine with intestinal obstruction: Secondary | ICD-10-CM | POA: Diagnosis present

## 2023-01-13 MED ORDER — SODIUM CHLORIDE 0.9 % IV SOLN
5.0000 mg/kg | INTRAVENOUS | Status: DC
  Administered 2023-01-13: 300 mg via INTRAVENOUS
  Filled 2023-01-13: qty 30

## 2023-01-13 MED ORDER — DIPHENHYDRAMINE HCL 25 MG PO CAPS
50.0000 mg | ORAL_CAPSULE | Freq: Three times a day (TID) | ORAL | Status: DC | PRN
  Administered 2023-01-13: 50 mg via ORAL
  Filled 2023-01-13: qty 2

## 2023-01-13 MED ORDER — ACETAMINOPHEN 325 MG PO TABS
650.0000 mg | ORAL_TABLET | Freq: Three times a day (TID) | ORAL | Status: DC
  Administered 2023-01-13: 650 mg via ORAL
  Filled 2023-01-13: qty 2

## 2023-01-13 MED ORDER — SODIUM CHLORIDE 0.9 % IV SOLN: INTRAVENOUS | Status: DC | PRN

## 2023-01-13 NOTE — Progress Notes (Signed)
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 Patient pre-medicated with Tylenol and Benadryl per order. Infusion titrated per protocol. Patient tolerated well with no adverse reaction. Vital signs stable. Patient declined to stay for 1 hour observation post infusion and declined AVS. Patient advised to stop at the front desk and make next infusion appointment for 8 weeks from now.  Alert, oriented and ambulatory at discharge.

## 2023-01-13 NOTE — Telephone Encounter (Signed)
Received request to cosign orders for Remicade  Philip Joseph needs appt w/ me in next 1-2 mos - can use banding spot or 1130, 350 spot  Also have him do CBC, CMET and quantiferon soon  LOOKS LIKE I have an opening tomorrow he can have

## 2023-01-13 NOTE — Telephone Encounter (Signed)
I spoke with Philip Joseph and he is leaving town tomorrow. He is aware to come soon for labs, orders entered. His appointment has been moved to 02/25/2023 at 11:30AM.

## 2023-02-19 ENCOUNTER — Other Ambulatory Visit (HOSPITAL_COMMUNITY): Payer: Self-pay

## 2023-02-25 ENCOUNTER — Ambulatory Visit: Payer: MEDICAID | Admitting: Internal Medicine

## 2023-02-25 ENCOUNTER — Telehealth: Payer: Self-pay | Admitting: Internal Medicine

## 2023-02-25 NOTE — Telephone Encounter (Signed)
No-show today.  He needs to get labs that are ordered at a minimum before he continues with Remicade and I do need to see him.  Or one of the apps needs to check in with him and see how he is doing.  We will not continue Remicade unless he has an appointment and does the labs.  Please notify infusion services.

## 2023-02-25 NOTE — Telephone Encounter (Signed)
Spoke with Philip Joseph and told him he must come get his labs drawn. Monday's are good for him so he said he can come 03/09/2023. I told him the lab hours.   I called back and left him a detailed message that Philip Joseph can see him 03/09/2023 at 1:30pm. He called back and I told him about the appointment.

## 2023-03-03 ENCOUNTER — Telehealth: Payer: Self-pay

## 2023-03-03 NOTE — Telephone Encounter (Signed)
Pt was left a detailed message confirming his office visit on 03/09/2023 at 1:30 PM with Alcide Evener NP.

## 2023-03-09 ENCOUNTER — Other Ambulatory Visit (INDEPENDENT_AMBULATORY_CARE_PROVIDER_SITE_OTHER): Payer: MEDICAID

## 2023-03-09 ENCOUNTER — Ambulatory Visit: Payer: MEDICAID | Admitting: Nurse Practitioner

## 2023-03-09 ENCOUNTER — Encounter: Payer: Self-pay | Admitting: Nurse Practitioner

## 2023-03-09 ENCOUNTER — Ambulatory Visit (INDEPENDENT_AMBULATORY_CARE_PROVIDER_SITE_OTHER): Payer: MEDICAID | Admitting: Nurse Practitioner

## 2023-03-09 VITALS — BP 90/60 | HR 80 | Ht 67.0 in | Wt 132.6 lb

## 2023-03-09 DIAGNOSIS — K50012 Crohn's disease of small intestine with intestinal obstruction: Secondary | ICD-10-CM

## 2023-03-09 DIAGNOSIS — K5000 Crohn's disease of small intestine without complications: Secondary | ICD-10-CM | POA: Diagnosis not present

## 2023-03-09 DIAGNOSIS — Z7960 Long term (current) use of unspecified immunomodulators and immunosuppressants: Secondary | ICD-10-CM | POA: Diagnosis not present

## 2023-03-09 LAB — CBC WITH DIFFERENTIAL/PLATELET
Basophils Absolute: 0 10*3/uL (ref 0.0–0.1)
Basophils Relative: 0.7 % (ref 0.0–3.0)
Eosinophils Absolute: 0.1 10*3/uL (ref 0.0–0.7)
Eosinophils Relative: 1.5 % (ref 0.0–5.0)
HCT: 46.5 % (ref 39.0–52.0)
Hemoglobin: 16 g/dL (ref 13.0–17.0)
Lymphocytes Relative: 49 % — ABNORMAL HIGH (ref 12.0–46.0)
Lymphs Abs: 2.9 10*3/uL (ref 0.7–4.0)
MCHC: 34.4 g/dL (ref 30.0–36.0)
MCV: 91.8 fl (ref 78.0–100.0)
Monocytes Absolute: 0.4 10*3/uL (ref 0.1–1.0)
Monocytes Relative: 6.7 % (ref 3.0–12.0)
Neutro Abs: 2.5 10*3/uL (ref 1.4–7.7)
Neutrophils Relative %: 42.1 % — ABNORMAL LOW (ref 43.0–77.0)
Platelets: 171 10*3/uL (ref 150.0–400.0)
RBC: 5.07 Mil/uL (ref 4.22–5.81)
RDW: 12.5 % (ref 11.5–15.5)
WBC: 5.8 10*3/uL (ref 4.0–10.5)

## 2023-03-09 LAB — COMPREHENSIVE METABOLIC PANEL
ALT: 7 U/L (ref 0–53)
AST: 15 U/L (ref 0–37)
Albumin: 4.5 g/dL (ref 3.5–5.2)
Alkaline Phosphatase: 74 U/L (ref 39–117)
BUN: 9 mg/dL (ref 6–23)
CO2: 26 mEq/L (ref 19–32)
Calcium: 9.7 mg/dL (ref 8.4–10.5)
Chloride: 104 mEq/L (ref 96–112)
Creatinine, Ser: 1.06 mg/dL (ref 0.40–1.50)
GFR: 93.84 mL/min (ref >60.00)
Glucose, Bld: 119 mg/dL — ABNORMAL HIGH (ref 70–99)
Potassium: 3.7 mEq/L (ref 3.5–5.1)
Sodium: 137 mEq/L (ref 135–145)
Total Bilirubin: 1.5 mg/dL — ABNORMAL HIGH (ref 0.2–1.2)
Total Protein: 7.3 g/dL (ref 6.0–8.3)

## 2023-03-09 NOTE — Progress Notes (Addendum)
03/09/2023 Philip Joseph [id number] [birth year]   Chief Complaint: Crohn's disease follow up  History of Present Illness: Philip Joseph is a 32 year old male with a past medical history of Crohn's ileitis initially diagnosed in 2015 on Remicade 5mg /kg Q 8 weeks. He was last seen in office by Dr. Leone Payor 11/14/2022, at that time he was passing normal bowel movements and overall felt well.  He underwent laboratory studies this afternoon including a CBC, CMP and QuantiFERON gold, results are pending.  He is scheduled for his next Remicade infusion tomorrow.  He had central abdominal pain and diarrhea a few months ago after eating BBQ which abated that recurrence.  He tries to avoid spicy and barbecue food products.  Otherwise, he denies having any abdominal pain.  No nausea or vomiting.  He is passing a normal formed brown bowel movement daily.  No rectal bleeding or black stools.  He is lost 2 to 3 pounds which she is a bit concerned about.  He remains active throughout the day.  No fever, night sweats or chills.  No infections within the past year.      Latest Ref Rng & Units 08/10/2022    1:05 AM 04/27/2022   11:40 PM 04/20/2022    8:04 AM  CBC  WBC 4.0 - 10.5 K/uL 8.2  9.9  6.0   Hemoglobin 13.0 - 17.0 g/dL 03.4  74.2  59.5   Hematocrit 39.0 - 52.0 % 45.4  43.8  40.7   Platelets 150 - 400 K/uL 197  191  164         Latest Ref Rng & Units 08/10/2022    1:05 AM 04/27/2022   11:40 PM 04/20/2022    8:04 AM  CMP  Glucose 70 - 99 mg/dL 638  756  95   BUN 6 - 20 mg/dL 21  10  8    Creatinine 0.61 - 1.24 mg/dL 4.33  2.95  1.88   Sodium 135 - 145 mmol/L 136  140  138   Potassium 3.5 - 5.1 mmol/L 3.9  3.6  3.6   Chloride 98 - 111 mmol/L 102  103  105   CO2 22 - 32 mmol/L 26  25  25    Calcium 8.9 - 10.3 mg/dL 9.4  9.5  9.0      MR enterography off therapy 01/16/2021 IMPRESSION: 1. Similar appearance of short segment of terminal ileum with mild wall thickening and mucosal  enhancement compatible with residual terminal ileitis secondary to Crohn's disease. No signs of bowel obstruction, penetrating disease or abscess. 2. Large stool burden throughout the colon up to the level of the rectum.  GI PROCEDURES:   Colonoscopy 12/25/2014: 1. There was a stricture in the terminal ileum; multiple biopsies were performed using cold forceps - looks like a fibrotic Crohn's stricture  2. The examination was otherwise normal good prep. - ACTIVE CHRONIC CROHN'S ENTERITIS. - NO DYSPLASIA OR MALIGNANCY IDENTIFIED, SEE COMMENT  Colonoscopy 12/20/2013: 1. Severe erosion and ileitis was found in the terminal ileum; multiple biopsies were performed using cold forceps  2. Normal colon ULCERATED CHRONIC ACTIVE ILEITIS, SEE COMMENT. - NEGATIVE FOR DYSPLASIA.  Current Outpatient Medications on File Prior to Visit  Medication Sig Dispense Refill   cholecalciferol (VITAMIN D3) 25 MCG (1000 UNIT) tablet Take 1 tablet (1,000 Units total) by mouth daily. 90 tablet 3   dicyclomine (BENTYL) 20 MG tablet Take 1 tablet (20 mg total) by mouth 4 (four) times daily  as needed for spasms. 120 tablet 5   ibuprofen (ADVIL) 800 MG tablet Take 1 tablet (800 mg total) by mouth every 8 (eight) hours as needed. 21 tablet 0   inFLIXimab (REMICADE) 100 MG injection Remicade 5 mg/kg IV on week 0,2,6 then q 8 weeks 1 each 7   lidocaine (LIDODERM) 5 % Place 1 patch onto the skin daily. Remove & Discard patch within 12 hours or as directed by MD 30 patch 0   ondansetron (ZOFRAN ODT) 4 MG disintegrating tablet Take 1 tablet (4 mg total) by mouth every 8 (eight) hours as needed for nausea or vomiting. 10 tablet 0   Vitamin D, Ergocalciferol, (DRISDOL) 1.25 MG (50000 UNIT) CAPS capsule Take 1 capsule (50,000 Units total) by mouth every 7 (seven) days. 8 capsule 0   Current Facility-Administered Medications on File Prior to Visit  Medication Dose Route Frequency Provider Last Rate Last Admin   acetaminophen  (TYLENOL) tablet 650 mg  650 mg Oral Daily Iva Boop, MD       diphenhydrAMINE (BENADRYL) capsule 50 mg  50 mg Oral Daily Iva Boop, MD       Allergies  Allergen Reactions   Penicillins Rash    Has patient had a PCN reaction causing immediate rash, facial/tongue/throat swelling, SOB or lightheadedness with hypotension: No Has patient had a PCN reaction causing severe rash involving mucus membranes or skin necrosis: No Has patient had a PCN reaction that required hospitalization No Has patient had a PCN reaction occurring within the last 10 years: No If all of the above answers are "NO", then may proceed with Cephalosporin use.    Current Medications, Allergies, Past Medical History, Past Surgical History, Family History and Social History were reviewed in [employer] record.  Review of Systems:   Constitutional: + Lost a few lbs. Negative for fever or night sweats.  Respiratory: Negative for shortness of breath.   Cardiovascular: Negative for chest pain, palpitations and leg swelling.  Gastrointestinal: See HPI.  Musculoskeletal: Negative for back pain or muscle aches.  Neurological: Negative for dizziness, headaches or paresthesias.   Physical Exam: BP 90/60   Pulse 80   Ht 5\' 7"  (1.702 m)   Wt 132 lb 9.6 oz (60.1 kg)   SpO2 98%   BMI 20.77 kg/m   Wt Readings from Last 3 Encounters:  03/09/23 132 lb 9.6 oz (60.1 kg)  01/13/23 135 lb (61.2 kg)  11/18/22 135 lb (61.2 kg)    General: 32 year old male in no acute distress. Head: Normocephalic and atraumatic. Eyes: No scleral icterus. Conjunctiva pink . Ears: Normal auditory acuity. Mouth: Dentition intact. No ulcers or lesions.  Lungs: Clear throughout to auscultation. Heart: Regular rate and rhythm, no murmur. Abdomen: Soft, nontender and nondistended. No masses or hepatomegaly. Normal bowel sounds x 4 quadrants.  Rectal: Deferred.  Musculoskeletal: Symmetrical with no gross  deformities. Extremities: No edema. Neurological: Alert oriented x 4. No focal deficits.  Psychological: Alert and cooperative. Normal mood and affect  Assessment and Recommendations:  32 year old male with Crohn's ileitis. Small bowel enterography 12/2020 showed stable terminal ileitis.  Colonoscopy 12/2014 showed a stable stricture in the terminal ileum, path results consistent with active chronic Crohn's enteritis without dysplasia.  Crohn's enteritis  is well-controlled on Remicade 5 mg/kilogram infusions Q 8 weeks.   -CBC, CMP and QuantiFERON gold levels drawn today -Proceed with Remicade infusion as scheduled 03/10/2023 -Avoid spicy/greasy foods -Nondairy Boost/ensure protein supplement  -No NSAIDs -Patient  to contact our office he develops any infectious process or if he continues to lose weight -Follow-up in office in 6 months -Dr. Leone Payor to verify next colonoscopy due date  Today's encounter was 25 minutes which included precharting, chart/result review, history/exam, face-to-face time used for counseling, formulating a treatment plan with follow-up and documentation.  Primary GI MD  Next routine colonoscopy would be age 59 for CRCA screening and as needed guided by signs and sxs for Crohn's - he has small bowel disease and does not need routine colonoscopy for that  Iva Boop, MD, [facility]

## 2023-03-09 NOTE — Patient Instructions (Addendum)
Proceed with Remicade infusion as scheduled.  Take a Non-dairy Boost or protein supplement of your choice daily.  Due to recent changes in healthcare laws, you may see the results of your imaging and laboratory studies on MyChart before your provider has had a chance to review them.  We understand that in some cases there may be results that are confusing or concerning to you. Not all laboratory results come back in the same time frame and the provider may be waiting for multiple results in order to interpret others.  Please give [place] 48 hours in order for your provider to thoroughly review all the results before contacting the office for clarification of your results.   Thank you for trusting me with your gastrointestinal care!   Alcide Evener, CRNP

## 2023-03-10 ENCOUNTER — Non-Acute Institutional Stay (HOSPITAL_COMMUNITY)
Admission: RE | Admit: 2023-03-10 | Discharge: 2023-03-10 | Disposition: A | Discharge status: Home / Self Care | Payer: MEDICAID | Source: Ambulatory Visit | Attending: Internal Medicine | Admitting: Internal Medicine

## 2023-03-10 DIAGNOSIS — K50012 Crohn's disease of small intestine with intestinal obstruction: Secondary | ICD-10-CM | POA: Diagnosis present

## 2023-03-10 MED ORDER — ACETAMINOPHEN 325 MG PO TABS
650.0000 mg | ORAL_TABLET | Freq: Once | ORAL | Status: AC
  Administered 2023-03-10: 650 mg via ORAL
  Filled 2023-03-10: qty 2

## 2023-03-10 MED ORDER — SODIUM CHLORIDE 0.9 % IV SOLN
5.0000 mg/kg | INTRAVENOUS | Status: DC
  Administered 2023-03-10: 300 mg via INTRAVENOUS
  Filled 2023-03-10: qty 30

## 2023-03-10 MED ORDER — DIPHENHYDRAMINE HCL 25 MG PO CAPS
50.0000 mg | ORAL_CAPSULE | Freq: Once | ORAL | Status: AC
  Administered 2023-03-10: 50 mg via ORAL
  Filled 2023-03-10: qty 2

## 2023-03-10 MED ORDER — SODIUM CHLORIDE 0.9 % IV SOLN: INTRAVENOUS | Status: DC | PRN

## 2023-03-10 NOTE — Progress Notes (Signed)
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 Patient pre-medicated with Tylenol and Benadryl per order. Infusion titrated per protocol. Patient tolerated well with no adverse reaction. Vital signs stable. Patient declined to stay for 1 hour observation post infusion and declined AVS. Patient advised to stop at the front desk and make next infusion appointment for 8 weeks from now.  Alert, oriented and ambulatory at discharge.

## 2023-03-11 LAB — QUANTIFERON-TB GOLD PLUS
Mitogen-NIL: >10 IU/mL
NIL: 0.06 IU/mL
QuantiFERON-TB Gold Plus: NEGATIVE
TB1-NIL: 0.03 IU/mL
TB2-NIL: 0.03 IU/mL

## 2023-04-13 ENCOUNTER — Ambulatory Visit: Payer: Medicaid Other | Admitting: Internal Medicine

## 2023-05-05 ENCOUNTER — Non-Acute Institutional Stay (HOSPITAL_COMMUNITY)
Admission: RE | Admit: 2023-05-05 | Discharge: 2023-05-05 | Disposition: A | Discharge status: Home / Self Care | Payer: MEDICAID | Source: Ambulatory Visit | Attending: Internal Medicine | Admitting: Internal Medicine

## 2023-05-05 DIAGNOSIS — K50012 Crohn's disease of small intestine with intestinal obstruction: Secondary | ICD-10-CM | POA: Diagnosis present

## 2023-05-05 MED ORDER — SODIUM CHLORIDE 0.9 % IV SOLN: INTRAVENOUS | Status: DC | PRN

## 2023-05-05 MED ORDER — ACETAMINOPHEN 325 MG PO TABS
650.0000 mg | ORAL_TABLET | Freq: Once | ORAL | Status: AC
  Administered 2023-05-05: 650 mg via ORAL
  Filled 2023-05-05: qty 2

## 2023-05-05 MED ORDER — SODIUM CHLORIDE 0.9 % IV SOLN: 10.0000 mg/kg | INTRAVENOUS | Status: DC

## 2023-05-05 MED ORDER — DIPHENHYDRAMINE HCL 25 MG PO CAPS
50.0000 mg | ORAL_CAPSULE | Freq: Once | ORAL | Status: AC
  Administered 2023-05-05: 50 mg via ORAL
  Filled 2023-05-05: qty 2

## 2023-05-05 MED ORDER — SODIUM CHLORIDE 0.9 % IV SOLN
5.0000 mg/kg | INTRAVENOUS | Status: DC
  Administered 2023-05-05: 300 mg via INTRAVENOUS
  Filled 2023-05-05: qty 30

## 2023-05-05 NOTE — Progress Notes (Signed)
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 Patient pre-medicated with Tylenol and Benadryl per order. Infusion titrated per protocol. Patient tolerated well with no adverse reaction. Vital signs stable. Patient declined to stay for 1 hour observation post infusion and declined AVS. Patient scheduled for next appointment on 06/30/23.  Alert, oriented and ambulatory at discharge.

## 2023-05-12 ENCOUNTER — Other Ambulatory Visit: Payer: Self-pay

## 2023-05-12 ENCOUNTER — Emergency Department (HOSPITAL_COMMUNITY)
Admission: EM | Admit: 2023-05-12 | Discharge: 2023-05-12 | Disposition: A | Discharge status: Home / Self Care | Payer: MEDICAID | Attending: Emergency Medicine | Admitting: Emergency Medicine

## 2023-05-12 ENCOUNTER — Encounter (HOSPITAL_COMMUNITY): Payer: Self-pay

## 2023-05-12 ENCOUNTER — Emergency Department (HOSPITAL_COMMUNITY): Payer: MEDICAID

## 2023-05-12 DIAGNOSIS — Y92410 Unspecified street and highway as the place of occurrence of the external cause: Secondary | ICD-10-CM | POA: Diagnosis not present

## 2023-05-12 DIAGNOSIS — M545 Low back pain, unspecified: Secondary | ICD-10-CM | POA: Diagnosis present

## 2023-05-12 MED ORDER — CYCLOBENZAPRINE HCL 10 MG PO TABS
10.0000 mg | ORAL_TABLET | Freq: Two times a day (BID) | ORAL | 0 refills | Status: DC | PRN
Start: 2023-05-12 — End: 2023-09-16

## 2023-05-12 MED ORDER — LIDOCAINE 5 % EX PTCH
1.0000 | MEDICATED_PATCH | CUTANEOUS | Status: DC
  Administered 2023-05-12: 1 via TRANSDERMAL
  Filled 2023-05-12: qty 1

## 2023-05-12 MED ORDER — NAPROXEN 500 MG PO TABS
500.0000 mg | ORAL_TABLET | Freq: Two times a day (BID) | ORAL | 0 refills | Status: DC
Start: 2023-05-12 — End: 2023-09-16
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
## 2023-05-12 NOTE — Discharge Instructions (Addendum)
Your XR is normal today. Please take tylenol/ibuprofen/naproxen for pain. I recommend close follow-up with PCP for reevaluation.  Please do not hesitate to return to emergency department if worrisome signs symptoms we discussed become apparent.
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
## 2023-06-30 ENCOUNTER — Non-Acute Institutional Stay (HOSPITAL_COMMUNITY)
Admission: RE | Admit: 2023-06-30 | Discharge: 2023-06-30 | Disposition: A | Discharge status: Home / Self Care | Payer: MEDICAID | Source: Ambulatory Visit | Attending: Internal Medicine | Admitting: Internal Medicine

## 2023-06-30 DIAGNOSIS — K50012 Crohn's disease of small intestine with intestinal obstruction: Secondary | ICD-10-CM | POA: Insufficient documentation

## 2023-06-30 MED ORDER — SODIUM CHLORIDE 0.9 % IV SOLN: INTRAVENOUS | Status: DC | PRN

## 2023-06-30 MED ORDER — SODIUM CHLORIDE 0.9 % IV SOLN
300.0000 mg | Freq: Once | INTRAVENOUS | Status: AC
  Administered 2023-06-30: 300 mg via INTRAVENOUS
  Filled 2023-06-30: qty 30

## 2023-06-30 MED ORDER — ACETAMINOPHEN 325 MG PO TABS
650.0000 mg | ORAL_TABLET | Freq: Once | ORAL | Status: AC
  Administered 2023-06-30: 650 mg via ORAL
  Filled 2023-06-30: qty 2

## 2023-06-30 MED ORDER — DIPHENHYDRAMINE HCL 25 MG PO CAPS
50.0000 mg | ORAL_CAPSULE | Freq: Once | ORAL | Status: AC
  Administered 2023-06-30: 50 mg via ORAL
  Filled 2023-06-30: qty 2

## 2023-06-30 NOTE — Progress Notes (Signed)
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 Patient pre-medicated with Tylenol and Benadryl per order. Infusion titrated per protocol. Patient tolerated well with no adverse reaction. Vital signs stable. Patient declined to stay for 1 hour observation post infusion and declined AVS. Patient scheduled for next appointment on 08/25/23.  Alert, oriented and ambulatory at discharge.

## 2023-08-25 ENCOUNTER — Non-Acute Institutional Stay (HOSPITAL_COMMUNITY)
Admission: RE | Admit: 2023-08-25 | Discharge: 2023-08-25 | Disposition: A | Discharge status: Home / Self Care | Payer: MEDICAID | Source: Ambulatory Visit | Attending: Internal Medicine | Admitting: Internal Medicine

## 2023-08-25 DIAGNOSIS — K50012 Crohn's disease of small intestine with intestinal obstruction: Secondary | ICD-10-CM | POA: Diagnosis present

## 2023-08-25 MED ORDER — SODIUM CHLORIDE 0.9 % IV SOLN
5.0000 mg/kg | INTRAVENOUS | Status: DC
  Administered 2023-08-25: 300 mg via INTRAVENOUS
  Filled 2023-08-25: qty 30

## 2023-08-25 MED ORDER — ACETAMINOPHEN 325 MG PO TABS
650.0000 mg | ORAL_TABLET | Freq: Once | ORAL | Status: AC
  Administered 2023-08-25: 650 mg via ORAL
  Filled 2023-08-25: qty 2

## 2023-08-25 MED ORDER — DIPHENHYDRAMINE HCL 25 MG PO CAPS
50.0000 mg | ORAL_CAPSULE | Freq: Once | ORAL | Status: AC
  Administered 2023-08-25: 50 mg via ORAL
  Filled 2023-08-25: qty 2

## 2023-08-25 MED ORDER — SODIUM CHLORIDE 0.9 % IV SOLN: INTRAVENOUS | Status: DC | PRN

## 2023-08-25 NOTE — Progress Notes (Signed)
 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 Patient pre-medicated with Tylenol  and Benadryl  per order. Infusion titrated per protocol. Patient tolerated well with no adverse reaction. Vital signs stable. Patient declined to stay for 1 hour observation post infusion and declined AVS. Patient to come back in 2 months and will schedule next appointment at the front desk.  Alert, oriented and ambulatory at discharge.

## 2023-09-16 ENCOUNTER — Ambulatory Visit: Payer: MEDICAID | Admitting: Internal Medicine

## 2023-09-16 ENCOUNTER — Encounter: Payer: Self-pay | Admitting: Internal Medicine

## 2023-09-16 VITALS — BP 98/70 | HR 84 | Ht 69.0 in | Wt 135.4 lb

## 2023-09-16 DIAGNOSIS — M545 Low back pain, unspecified: Secondary | ICD-10-CM | POA: Diagnosis not present

## 2023-09-16 DIAGNOSIS — K5000 Crohn's disease of small intestine without complications: Secondary | ICD-10-CM | POA: Diagnosis not present

## 2023-09-16 DIAGNOSIS — Z7960 Long term (current) use of unspecified immunomodulators and immunosuppressants: Secondary | ICD-10-CM

## 2023-09-16 NOTE — Patient Instructions (Signed)
Please follow up with your primary care Doctor about your back pain.  You will be due for labs in July. We will contact you to remind you about this.  _______________________________________________________  If your blood pressure at your visit was 140/90 or greater, please contact your primary care physician to follow up on this.  _______________________________________________________  If you are age 32 or older, your body mass index should be between 23-30. Your Body mass index is 19.99 kg/m. If this is out of the aforementioned range listed, please consider follow up with your Primary Care Provider.  If you are age 15 or younger, your body mass index should be between 19-25. Your Body mass index is 19.99 kg/m. If this is out of the aformentioned range listed, please consider follow up with your Primary Care Provider.   ________________________________________________________  The [place] [facility] providers would like to encourage you to use [facility] to communicate with providers for non-urgent requests or questions.  Due to long hold times on the telephone, sending your provider a message by [facility] may be a faster and more efficient way to get a response.  Please allow 48 business hours for a response.  Please remember that this is for non-urgent requests.  _______________________________________________________  I appreciate the opportunity to care for you. Stan Head, MD, [facility]
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
## 2023-09-21 ENCOUNTER — Other Ambulatory Visit (HOSPITAL_COMMUNITY): Payer: Self-pay | Admitting: *Deleted

## 2023-09-29 ENCOUNTER — Other Ambulatory Visit: Payer: Self-pay

## 2023-09-29 DIAGNOSIS — K5000 Crohn's disease of small intestine without complications: Secondary | ICD-10-CM

## 2023-09-29 DIAGNOSIS — Z7960 Long term (current) use of unspecified immunomodulators and immunosuppressants: Secondary | ICD-10-CM

## 2023-10-20 ENCOUNTER — Non-Acute Institutional Stay (HOSPITAL_COMMUNITY)
Admission: RE | Admit: 2023-10-20 | Discharge: 2023-10-20 | Disposition: A | Discharge status: Home / Self Care | Payer: MEDICAID | Source: Ambulatory Visit | Attending: Internal Medicine | Admitting: Internal Medicine

## 2023-10-20 DIAGNOSIS — K5000 Crohn's disease of small intestine without complications: Secondary | ICD-10-CM | POA: Diagnosis present

## 2023-10-20 DIAGNOSIS — Z7960 Long term (current) use of unspecified immunomodulators and immunosuppressants: Secondary | ICD-10-CM

## 2023-10-20 MED ORDER — SODIUM CHLORIDE 0.9 % IV SOLN
5.0000 mg/kg | INTRAVENOUS | Status: DC
  Administered 2023-10-20: 300 mg via INTRAVENOUS
  Filled 2023-10-20: qty 30

## 2023-10-20 MED ORDER — ACETAMINOPHEN 325 MG PO TABS
650.0000 mg | ORAL_TABLET | Freq: Once | ORAL | Status: AC
  Administered 2023-10-20: 650 mg via ORAL
  Filled 2023-10-20: qty 2

## 2023-10-20 MED ORDER — DIPHENHYDRAMINE HCL 25 MG PO CAPS
50.0000 mg | ORAL_CAPSULE | Freq: Once | ORAL | Status: AC
  Administered 2023-10-20: 50 mg via ORAL
  Filled 2023-10-20: qty 2

## 2023-10-20 MED ORDER — SODIUM CHLORIDE 0.9 % IV SOLN: INTRAVENOUS | Status: DC | PRN

## 2023-10-20 NOTE — Progress Notes (Signed)
 PATIENT CARE CENTER NOTE  Diagnosis: : Crohn's disease of ileum without complication (HCC) [K50.00]; Long-term use of immunosuppressant medication [Z79.60]    Provider: Iva Boop, MD   Procedure: Remicade 300 mg  Note: Patient received Remicade infusion (dose #1 of 6) via PIV. Pt pre-medicated with PO Tylenol and Benadryl per orders. Infusion titrated per protocol. Pt advised that he should return in 8 weeks for his next appointment and that he can schedule at the front desk. AVS offered, but pt declined. Pt is alert, oriented, and ambulatory at discharge.

## 2023-12-15 ENCOUNTER — Non-Acute Institutional Stay (HOSPITAL_COMMUNITY)
Admission: RE | Admit: 2023-12-15 | Discharge: 2023-12-15 | Disposition: A | Discharge status: Home / Self Care | Payer: MEDICAID | Source: Ambulatory Visit | Attending: Internal Medicine | Admitting: Internal Medicine

## 2023-12-15 DIAGNOSIS — Z7960 Long term (current) use of unspecified immunomodulators and immunosuppressants: Secondary | ICD-10-CM | POA: Insufficient documentation

## 2023-12-15 DIAGNOSIS — K5000 Crohn's disease of small intestine without complications: Secondary | ICD-10-CM | POA: Insufficient documentation

## 2023-12-15 MED ORDER — ACETAMINOPHEN 325 MG PO TABS
650.0000 mg | ORAL_TABLET | Freq: Once | ORAL | Status: AC
  Administered 2023-12-15: 650 mg via ORAL
  Filled 2023-12-15: qty 2

## 2023-12-15 MED ORDER — DIPHENHYDRAMINE HCL 25 MG PO CAPS
50.0000 mg | ORAL_CAPSULE | Freq: Once | ORAL | Status: AC
  Administered 2023-12-15: 50 mg via ORAL
  Filled 2023-12-15: qty 2

## 2023-12-15 MED ORDER — SODIUM CHLORIDE 0.9 % IV SOLN
300.0000 mg | INTRAVENOUS | Status: DC
  Administered 2023-12-15: 300 mg via INTRAVENOUS
  Filled 2023-12-15: qty 30

## 2023-12-15 MED ORDER — SODIUM CHLORIDE 0.9 % IV SOLN: INTRAVENOUS | Status: DC | PRN

## 2023-12-15 NOTE — Progress Notes (Signed)
 Patient Care Center Note    Diagnosis: Crohn's disease of ileum without complication (HCC) [K50.00]; Long-term use of immunosuppressant medication [Z79.60]   Provider: Kenney Peacemaker, MD   Procedure: Remicade  300 mg   Note: PIV started by IV team. Pt was premedicated with PO Tylenol  and Benadryl  per order. Pt received Remicade  infusion (dose #2 of 6) via PIV. Infusion titrated per protocol. Vitals taken per protocol and remained stable throughout infusion. Pt scheduled for next appointment on June 17th at 8am. AVS offered but pt declined. Pt is alert, oriented and ambulatory at d/c.

## 2024-02-09 ENCOUNTER — Non-Acute Institutional Stay (HOSPITAL_COMMUNITY)
Admission: RE | Admit: 2024-02-09 | Discharge: 2024-02-09 | Disposition: A | Discharge status: Home / Self Care | Payer: MEDICAID | Source: Ambulatory Visit | Attending: Internal Medicine | Admitting: Internal Medicine

## 2024-02-09 DIAGNOSIS — K50012 Crohn's disease of small intestine with intestinal obstruction: Secondary | ICD-10-CM | POA: Insufficient documentation

## 2024-02-09 MED ORDER — SODIUM CHLORIDE 0.9 % IV SOLN: INTRAVENOUS | Status: DC | PRN

## 2024-02-09 MED ORDER — ACETAMINOPHEN 325 MG PO TABS
650.0000 mg | ORAL_TABLET | Freq: Once | ORAL | Status: AC
  Administered 2024-02-09: 650 mg via ORAL
  Filled 2024-02-09: qty 2

## 2024-02-09 MED ORDER — DIPHENHYDRAMINE HCL 25 MG PO CAPS
50.0000 mg | ORAL_CAPSULE | Freq: Once | ORAL | Status: AC
  Administered 2024-02-09: 50 mg via ORAL
  Filled 2024-02-09: qty 2

## 2024-02-09 MED ORDER — SODIUM CHLORIDE 0.9 % IV SOLN
5.0000 mg/kg | INTRAVENOUS | Status: DC
  Administered 2024-02-09: 300 mg via INTRAVENOUS
  Filled 2024-02-09: qty 30

## 2024-02-09 NOTE — Progress Notes (Signed)
 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 Patient pre-medicated with Tylenol  and Benadryl  per order. Infusion titrated per protocol. Patient tolerated well with no adverse reaction. Vital signs stable. Patient declined to stay for the 1 hour observation post infusion and declined AVS. Patient's next appointment scheduled for 04/05/24.  Patient alert, oriented and ambulatory at discharge.

## 2024-02-10 ENCOUNTER — Telehealth: Payer: Self-pay

## 2024-02-10 DIAGNOSIS — K5000 Crohn's disease of small intestine without complications: Secondary | ICD-10-CM

## 2024-02-10 DIAGNOSIS — Z7960 Long term (current) use of unspecified immunomodulators and immunosuppressants: Secondary | ICD-10-CM

## 2024-02-10 NOTE — Telephone Encounter (Signed)
 I spoke with Philip Joseph and told him to please come early July to get labs drawn. I told him the lab hours. He verbalized understanding.

## 2024-02-10 NOTE — Telephone Encounter (Signed)
-----   Message from Loy Ruff sent at 02/10/2024  2:53 PM EDT ----- CBC CMET Quantiferon TB  Thanks! ----- Message ----- From: [place], Lathon Adan E, CMA Sent: 02/09/2024   8:00 AM EDT To: Ingris Pasquarella E [place], CMA; Kenney Peacemaker, MD  He is due for labs in July Sir. Tell me what and I can place orders and I will call and remind him to come. Have a fabulous day Sir!

## 2024-03-01 ENCOUNTER — Other Ambulatory Visit (INDEPENDENT_AMBULATORY_CARE_PROVIDER_SITE_OTHER): Payer: MEDICAID

## 2024-03-01 DIAGNOSIS — Z7960 Long term (current) use of unspecified immunomodulators and immunosuppressants: Secondary | ICD-10-CM | POA: Diagnosis not present

## 2024-03-01 DIAGNOSIS — K5000 Crohn's disease of small intestine without complications: Secondary | ICD-10-CM

## 2024-03-01 LAB — COMPREHENSIVE METABOLIC PANEL WITH GFR
ALT: 6 U/L (ref 0–53)
AST: 12 U/L (ref 0–37)
Albumin: 4.4 g/dL (ref 3.5–5.2)
Alkaline Phosphatase: 64 U/L (ref 39–117)
BUN: 11 mg/dL (ref 6–23)
CO2: 28 meq/L (ref 19–32)
Calcium: 9 mg/dL (ref 8.4–10.5)
Chloride: 104 meq/L (ref 96–112)
Creatinine, Ser: 1.03 mg/dL (ref 0.40–1.50)
GFR: 96.47 mL/min (ref >60.00)
Glucose, Bld: 81 mg/dL (ref 70–99)
Potassium: 3.9 meq/L (ref 3.5–5.1)
Sodium: 139 meq/L (ref 135–145)
Total Bilirubin: 0.7 mg/dL (ref 0.2–1.2)
Total Protein: 7 g/dL (ref 6.0–8.3)

## 2024-03-01 LAB — CBC WITH DIFFERENTIAL/PLATELET
Basophils Absolute: 0 K/uL (ref 0.0–0.1)
Basophils Relative: 0.3 % (ref 0.0–3.0)
Eosinophils Absolute: 0.1 K/uL (ref 0.0–0.7)
Eosinophils Relative: 2.8 % (ref 0.0–5.0)
HCT: 43.5 % (ref 39.0–52.0)
Hemoglobin: 15.2 g/dL (ref 13.0–17.0)
Lymphocytes Relative: 47.9 % — ABNORMAL HIGH (ref 12.0–46.0)
Lymphs Abs: 2.4 K/uL (ref 0.7–4.0)
MCHC: 35 g/dL (ref 30.0–36.0)
MCV: 90.5 fl (ref 78.0–100.0)
Monocytes Absolute: 0.3 K/uL (ref 0.1–1.0)
Monocytes Relative: 6.2 % (ref 3.0–12.0)
Neutro Abs: 2.2 K/uL (ref 1.4–7.7)
Neutrophils Relative %: 42.8 % — ABNORMAL LOW (ref 43.0–77.0)
Platelets: 154 K/uL (ref 150.0–400.0)
RBC: 4.81 Mil/uL (ref 4.22–5.81)
RDW: 12.9 % (ref 11.5–15.5)
WBC: 5.1 K/uL (ref 4.0–10.5)

## 2024-03-03 ENCOUNTER — Ambulatory Visit: Payer: Self-pay | Admitting: Internal Medicine

## 2024-03-03 LAB — QUANTIFERON-TB GOLD PLUS
Mitogen-NIL: 9.6 [IU]/mL
NIL: 0.03 [IU]/mL
QuantiFERON-TB Gold Plus: NEGATIVE
TB1-NIL: 0 [IU]/mL
TB2-NIL: 0 [IU]/mL

## 2024-04-05 ENCOUNTER — Non-Acute Institutional Stay (HOSPITAL_COMMUNITY)
Admission: RE | Admit: 2024-04-05 | Discharge: 2024-04-05 | Disposition: A | Discharge status: Home / Self Care | Payer: MEDICAID | Source: Ambulatory Visit | Attending: Internal Medicine | Admitting: Internal Medicine

## 2024-04-05 DIAGNOSIS — K50012 Crohn's disease of small intestine with intestinal obstruction: Secondary | ICD-10-CM | POA: Diagnosis present

## 2024-04-05 MED ORDER — SODIUM CHLORIDE 0.9 % IV SOLN: INTRAVENOUS | Status: DC | PRN

## 2024-04-05 MED ORDER — ACETAMINOPHEN 325 MG PO TABS
650.0000 mg | ORAL_TABLET | Freq: Once | ORAL | Status: AC
  Administered 2024-04-05 (×2): 650 mg via ORAL
  Filled 2024-04-05: qty 2

## 2024-04-05 MED ORDER — SODIUM CHLORIDE 0.9 % IV SOLN
300.0000 mg | INTRAVENOUS | Status: DC
  Administered 2024-04-05 (×2): 300 mg via INTRAVENOUS
  Filled 2024-04-05: qty 30

## 2024-04-05 MED ORDER — DIPHENHYDRAMINE HCL 25 MG PO CAPS
50.0000 mg | ORAL_CAPSULE | Freq: Once | ORAL | Status: AC
  Administered 2024-04-05 (×2): 50 mg via ORAL
  Filled 2024-04-05: qty 2

## 2024-04-05 NOTE — Progress Notes (Signed)
 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 Patient pre-medicated with Tylenol  and Benadryl  per order. Infusion titrated per protocol. Patient tolerated well with no adverse reaction. Vital signs stable. Patient declined to stay for the 1 hour observation post infusion and declined AVS. Patient's next appointment scheduled for 05/31/24.  Patient alert, oriented and ambulatory at discharge.

## 2024-05-11 ENCOUNTER — Ambulatory Visit (INDEPENDENT_AMBULATORY_CARE_PROVIDER_SITE_OTHER): Payer: MEDICAID | Admitting: Internal Medicine

## 2024-05-11 ENCOUNTER — Encounter: Payer: Self-pay | Admitting: Internal Medicine

## 2024-05-11 VITALS — BP 100/64 | HR 67 | Ht 69.0 in | Wt 130.2 lb

## 2024-05-11 DIAGNOSIS — Z7960 Long term (current) use of unspecified immunomodulators and immunosuppressants: Secondary | ICD-10-CM

## 2024-05-11 DIAGNOSIS — K5000 Crohn's disease of small intestine without complications: Secondary | ICD-10-CM | POA: Diagnosis not present

## 2024-05-11 NOTE — Progress Notes (Signed)
 Philip Joseph 33 y.o. [birth year] [id number]  Assessment & Plan:   Encounter Diagnoses  Name Primary?   Crohn's disease of ileum without complication (HCC) Yes   Long-term use of immunosuppressant medication    Patient is doing well.  Will continue every 8-week infliximab  5 mg/kg.  Return to see me in approximately 6 months.  Needs QuantiFERON testing in July of next year.  Can follow-up vitamin D  level then also.   Subjective:  Gastroenterology summary:   Crohn's ileitis with terminal ileal stricture diagnosed 2015 treated with Remicade  5 mg/kg IV every 8 weeks times many years and does well clinically.  Infusions at the [facility].   Last QuantiFERON - July 2025 MR-Enterography 01/16/21 IMPRESSION: 1. Similar appearance of short segment of terminal ileum with mild wall thickening and mucosal enhancement compatible with residual terminal ileitis secondary to Crohn's disease. No signs of bowel obstruction, penetrating disease or abscess. 2. Large stool burden throughout the colon up to the level of the rectum.   Colonoscopy 12/25/14 - terminal ileum stricture - bx chronic crohn's ileitis   History of vitamin D  deficiency Maintained on 1000 units daily, has received booster supplementation over time, last level 33 in March 2023   Moderate intellectual disability plus ADHD-unmedicated   --------------------------------------------------------------------------------------------------------------  Chief Complaint: Follow-up of Crohn's ileitis  HPI 33 year old man with a long history of Crohn's disease of the ileum who has been doing well for years on infliximab  therapy.  He is here for interval follow-up and says that he had been having some gastrointestinal distress and cramps when he was using barbecue sauce and he stopped that and he is asymptomatic.  Formed bowel movements no diarrhea or bleeding.  He has stopped smoking cigarettes but continues to  use some marijuana.  QuantiFERON testing was negative on July 8, CMET was normal on July 8, CBC was normal on July 8.  These are in the EMR.  Wt Readings from Last 3 Encounters:  05/11/24 130 lb 3.2 oz (59.1 kg)  04/05/24 133 lb (60.3 kg)  02/09/24 130 lb (59 kg)     Allergies  Allergen Reactions   Penicillins Rash    Has patient had a PCN reaction causing immediate rash, facial/tongue/throat swelling, SOB or lightheadedness with hypotension: No Has patient had a PCN reaction causing severe rash involving mucus membranes or skin necrosis: No Has patient had a PCN reaction that required hospitalization No Has patient had a PCN reaction occurring within the last 10 years: No If all of the above answers are NO, then may proceed with Cephalosporin use.    Current Meds  Medication Sig   cholecalciferol (VITAMIN D3) 25 MCG (1000 UNIT) tablet Take 1 tablet (1,000 Units total) by mouth daily.   dicyclomine  (BENTYL ) 20 MG tablet Take 1 tablet (20 mg total) by mouth 4 (four) times daily as needed for spasms.   inFLIXimab  (REMICADE ) 100 MG injection Remicade  5 mg/kg IV on week 0,2,6 then q 8 weeks   ondansetron  (ZOFRAN  ODT) 4 MG disintegrating tablet Take 1 tablet (4 mg total) by mouth every 8 (eight) hours as needed for nausea or vomiting.   Vitamin D , Ergocalciferol , (DRISDOL ) 1.25 MG (50000 UNIT) CAPS capsule Take 1 capsule (50,000 Units total) by mouth every 7 (seven) days.   Current Facility-Administered Medications for the 05/11/24 encounter (Office Visit) with Avram Lupita BRAVO, MD  Medication   acetaminophen  (TYLENOL ) tablet 650 mg   diphenhydrAMINE  (BENADRYL ) capsule 50 mg   Past Medical  History:  Diagnosis Date   ADHD (attention deficit hyperactivity disorder)    Crohn's ileitis (HCC) 12/19/2013   Long term current use of systemic steroids 11/27/2015   Moderate intellectual disability 11/29/2014   Unspecified vitamin D  deficiency 12/21/2013   Past Surgical History:  Procedure  Laterality Date   COLONOSCOPY N/A 12/20/2013   Procedure: COLONOSCOPY;  Surgeon: Lupita FORBES Commander, MD;  Location: [facility];  Service: Endoscopy;  Laterality: N/A;   COLONOSCOPY     WRIST FRACTURE SURGERY     Social History   Social History Narrative   Single, 1 son born 2018.   Lives w/ mom   + Smoker of tobacco and marijuana   Disability ?   family history includes Prostate cancer in his father.   Review of Systems As per HPI  Objective:   Physical Exam @BP  100/64   Pulse 67   Ht 5' 9 (1.753 m)   Wt 130 lb 3.2 oz (59.1 kg)   BMI 19.23 kg/m @  General:  NAD Eyes:   anicteric Lungs:  clear Heart::  S1S2 no rubs, murmurs or gallops Abdomen:  soft and nontender, BS+ Ext:   no edema, cyanosis or clubbing    Data Reviewed:  See HPI in summary  I spent 31 minutes of time, including in depth chart review, independent review of results as outlined above, communicating results with the patient directly, face-to-face time with the patient, coordinating care, ordering studies and medications as appropriate, and documentation.

## 2024-05-11 NOTE — Patient Instructions (Signed)
 Follow-up 6 months.   _______________________________________________________  If your blood pressure at your visit was 140/90 or greater, please contact your primary care physician to follow up on this.  _______________________________________________________  If you are age 32 or older, your body mass index should be between 23-30. Your Body mass index is 19.23 kg/m. If this is out of the aforementioned range listed, please consider follow up with your Primary Care Provider.  If you are age 73 or younger, your body mass index should be between 19-25. Your Body mass index is 19.23 kg/m. If this is out of the aformentioned range listed, please consider follow up with your Primary Care Provider.   ________________________________________________________  The [facility] providers would like to encourage you to use MYCHART to communicate with providers for non-urgent requests or questions.  Due to long hold times on the telephone, sending your provider a message by [facility] may be a faster and more efficient way to get a response.  Please allow 48 business hours for a response.  Please remember that this is for non-urgent requests.  _______________________________________________________  [facility] is using a team-based approach to care.  Your team is made up of your doctor and two to three APPS. Our APPS (Nurse Practitioners and Physician Assistants) work with your physician to ensure care continuity for you. They are fully qualified to address your health concerns and develop a treatment plan. They communicate directly with your gastroenterologist to care for you. Seeing the Advanced Practice Practitioners on your physician's team can help you by facilitating care more promptly, often allowing for earlier appointments, access to diagnostic testing, procedures, and other specialty referrals.   Thank you for choosing me and  Gastroenterology.  Dr.Carl Avram

## 2024-05-31 ENCOUNTER — Non-Acute Institutional Stay (HOSPITAL_COMMUNITY): Payer: MEDICAID

## 2024-06-03 ENCOUNTER — Encounter (HOSPITAL_COMMUNITY): Payer: MEDICAID

## 2024-06-03 ENCOUNTER — Telehealth (HOSPITAL_COMMUNITY): Payer: Self-pay | Admitting: Pharmacy Technician

## 2024-06-03 NOTE — Telephone Encounter (Signed)
 Auth Submission: NO AUTH NEEDED Site of care: [facility] Payer: [employer] (medicaid) Medication & CPT/J Code(s) submitted: Remicade  (Infliximab ) J1745 Diagnosis Code: D50.012 Route of submission (phone, fax, portal):  Phone # Fax # Auth type: Buy/Bill HB Units/visits requested: 5MG /KG Q 8 WEEKS Reference number:   Approval from: 06/03/2024 to 08/24/24    Dagoberto Armour, CPhT [facility] Phone: [phone] 06/03/2024

## 2024-06-09 ENCOUNTER — Ambulatory Visit (HOSPITAL_COMMUNITY): Admission: RE | Admit: 2024-06-09 | Payer: MEDICAID | Source: Ambulatory Visit

## 2024-06-09 ENCOUNTER — Ambulatory Visit (HOSPITAL_COMMUNITY)
Admission: RE | Admit: 2024-06-09 | Discharge: 2024-06-09 | Disposition: A | Discharge status: Home / Self Care | Payer: MEDICAID | Source: Ambulatory Visit | Attending: Internal Medicine | Admitting: Internal Medicine

## 2024-06-09 VITALS — BP 93/53 | HR 60 | Temp 98.2°F | Resp 16 | Wt 130.0 lb

## 2024-06-09 DIAGNOSIS — K50012 Crohn's disease of small intestine with intestinal obstruction: Secondary | ICD-10-CM | POA: Diagnosis present

## 2024-06-09 MED ORDER — ACETAMINOPHEN 325 MG PO TABS
650.0000 mg | ORAL_TABLET | Freq: Once | ORAL | Status: AC
  Administered 2024-06-09: 650 mg via ORAL
  Filled 2024-06-09: qty 2

## 2024-06-09 MED ORDER — METHYLPREDNISOLONE SODIUM SUCC 125 MG IJ SOLR: 40.0000 mg | Freq: Once | INTRAMUSCULAR | Status: DC

## 2024-06-09 MED ORDER — DIPHENHYDRAMINE HCL 25 MG PO CAPS
25.0000 mg | ORAL_CAPSULE | Freq: Once | ORAL | Status: AC
  Administered 2024-06-09: 25 mg via ORAL
  Filled 2024-06-09: qty 1

## 2024-06-09 MED ORDER — SODIUM CHLORIDE 0.9 % IV SOLN
5.0000 mg/kg | Freq: Once | INTRAVENOUS | Status: AC
  Administered 2024-06-09: 300 mg via INTRAVENOUS
  Filled 2024-06-09: qty 30

## 2024-06-09 NOTE — Progress Notes (Signed)
 PATIENT CARE CENTER NOTE     Diagnosis: Crohn's disease of ileum with intestinal obstruction (HCC) (X49.987)     Provider: Avram Pitts MD     Procedure: Remicade  infusion      Note: Patient received Remicade  infusion (dose # 5 of 6) via PIV. Patient pre-medicated with Tylenol  and Benadryl  per order. Solu-medrol  IV also ordered but patient refused. Infusion titrated per protocol. Patient tolerated well with no adverse reaction. Vital signs stable. Patient declined to stay for the 1 hour observation post infusion and declined AVS. Patient's next appointment scheduled for 08/04/24.  Patient alert, oriented and ambulatory at discharge.

## 2024-08-04 ENCOUNTER — Inpatient Hospital Stay (HOSPITAL_COMMUNITY): Admission: RE | Admit: 2024-08-04 | Discharge: 2024-08-04 | Payer: MEDICAID | Attending: Internal Medicine

## 2024-08-04 VITALS — BP 114/70 | HR 68 | Temp 97.9°F | Resp 16 | Wt 130.0 lb

## 2024-08-04 DIAGNOSIS — K50012 Crohn's disease of small intestine with intestinal obstruction: Secondary | ICD-10-CM | POA: Insufficient documentation

## 2024-08-04 MED ORDER — ACETAMINOPHEN 325 MG PO TABS
650.0000 mg | ORAL_TABLET | Freq: Once | ORAL | Status: AC
  Administered 2024-08-04: 650 mg via ORAL
  Filled 2024-08-04: qty 2

## 2024-08-04 MED ORDER — DIPHENHYDRAMINE HCL 25 MG PO CAPS
25.0000 mg | ORAL_CAPSULE | Freq: Once | ORAL | Status: AC
  Administered 2024-08-04: 25 mg via ORAL
  Filled 2024-08-04: qty 1

## 2024-08-04 MED ORDER — SODIUM CHLORIDE 0.9 % IV SOLN
5.0000 mg/kg | Freq: Once | INTRAVENOUS | Status: AC
  Administered 2024-08-04: 300 mg via INTRAVENOUS
  Filled 2024-08-04: qty 30

## 2024-08-04 MED ORDER — METHYLPREDNISOLONE SODIUM SUCC 125 MG IJ SOLR
40.0000 mg | Freq: Once | INTRAMUSCULAR | Status: AC
  Administered 2024-08-04: 40 mg via INTRAVENOUS
  Filled 2024-08-04: qty 2

## 2024-08-04 NOTE — Progress Notes (Signed)
 PATIENT CARE CENTER NOTE     Diagnosis: Crohn's disease of ileum with intestinal obstruction (HCC) (X49.987)     Provider: Avram Pitts MD     Procedure: Remicade  infusion      Note: Patient received Remicade  infusion (dose # 6 of 6) via PIV. Patient pre-medicated with PO Tylenol , PO Benadryl  and IV Solu-medrol  per order. Infusion titrated per protocol. Patient tolerated well with no adverse reaction. Vital signs stable. Patient declined to stay for the 1 hour observation post infusion and declined AVS. Patient's next appointment scheduled for 09/29/24.  Patient alert, oriented and ambulatory at discharge.

## 2024-09-12 ENCOUNTER — Telehealth (HOSPITAL_COMMUNITY): Payer: Self-pay

## 2024-09-12 NOTE — Telephone Encounter (Signed)
 Auth Submission: NO AUTH NEEDED Site of care: Site of care: [facility] Payer: [employer] Medication & CPT/J Code(s) submitted: Remicade  (Infliximab ) J1745 Diagnosis Code: K50.00 Route of submission (phone, fax, portal):  Phone # Fax # Auth type: Buy/Bill HB Units/visits requested: 5mg /kg q8weeks Reference number:  Approval from: 09/12/24 to 08/24/25

## 2024-09-29 ENCOUNTER — Ambulatory Visit (HOSPITAL_COMMUNITY)
Admission: RE | Admit: 2024-09-29 | Discharge: 2024-09-29 | Disposition: A | Discharge status: Home / Self Care | Payer: MEDICAID | Source: Ambulatory Visit | Attending: Internal Medicine | Admitting: Internal Medicine

## 2024-09-29 VITALS — BP 110/67 | HR 87 | Temp 98.3°F | Resp 16 | Wt 132.0 lb

## 2024-09-29 DIAGNOSIS — K50012 Crohn's disease of small intestine with intestinal obstruction: Secondary | ICD-10-CM

## 2024-09-29 MED ORDER — METHYLPREDNISOLONE SODIUM SUCC 125 MG IJ SOLR
40.0000 mg | Freq: Once | INTRAMUSCULAR | Status: AC
  Administered 2024-09-29: 40 mg via INTRAVENOUS
  Filled 2024-09-29: qty 2

## 2024-09-29 MED ORDER — SODIUM CHLORIDE 0.9 % IV SOLN
5.0000 mg/kg | Freq: Once | INTRAVENOUS | Status: AC
  Administered 2024-09-29: 300 mg via INTRAVENOUS
  Filled 2024-09-29: qty 30

## 2024-09-29 MED ORDER — SODIUM CHLORIDE 0.9 % IV SOLN: INTRAVENOUS | Status: DC | PRN

## 2024-09-29 MED ORDER — DIPHENHYDRAMINE HCL 25 MG PO CAPS
25.0000 mg | ORAL_CAPSULE | Freq: Once | ORAL | Status: AC
  Administered 2024-09-29: 25 mg via ORAL
  Filled 2024-09-29: qty 1

## 2024-09-29 MED ORDER — ACETAMINOPHEN 325 MG PO TABS
650.0000 mg | ORAL_TABLET | Freq: Once | ORAL | Status: AC
  Administered 2024-09-29: 650 mg via ORAL
  Filled 2024-09-29: qty 2

## 2024-09-29 NOTE — Progress Notes (Signed)
"                PATIENT CARE CENTER NOTE     Diagnosis: Crohn's disease of ileum with intestinal obstruction (HCC) (X49.987)     Provider: Avram Pitts MD     Procedure: Remicade  infusion      Note: Patient received Remicade  infusion via PIV. Patient pre-medicated with PO Tylenol , PO Benadryl  and IV Solu-medrol  per order. Infusion titrated per protocol. Patient tolerated well with no adverse reaction. Vital signs stable. Patient declined to stay for the 1 hour observation post infusion and declined AVS. Patient's next appointment scheduled for 11/24/24.  Patient alert, oriented and ambulatory at discharge. "

## 2024-11-24 ENCOUNTER — Ambulatory Visit (HOSPITAL_COMMUNITY): Payer: MEDICAID
